# Patient Record
Sex: Male | Born: 1950 | Race: White | Hispanic: No | Marital: Married | Smoking: Former smoker
Health system: Southern US, Community
[De-identification: ages and names within clinical notes are randomized; demographics above are authoritative.]

## PROBLEM LIST (undated history)

## (undated) DIAGNOSIS — Z91030 Bee allergy status: Secondary | ICD-10-CM

## (undated) DIAGNOSIS — I4891 Unspecified atrial fibrillation: Secondary | ICD-10-CM

## (undated) DIAGNOSIS — I10 Essential (primary) hypertension: Secondary | ICD-10-CM

## (undated) DIAGNOSIS — E7800 Pure hypercholesterolemia, unspecified: Secondary | ICD-10-CM

## (undated) DIAGNOSIS — I639 Cerebral infarction, unspecified: Secondary | ICD-10-CM

## (undated) DIAGNOSIS — R634 Abnormal weight loss: Secondary | ICD-10-CM

## (undated) HISTORY — DX: Essential (primary) hypertension: I10

## (undated) HISTORY — DX: Unspecified atrial fibrillation: I48.91

## (undated) HISTORY — PX: CORONARY STENT PLACEMENT: SHX1402

## (undated) HISTORY — DX: Cerebral infarction, unspecified: I63.9

## (undated) HISTORY — DX: Bee allergy status: Z91.030

## (undated) HISTORY — PX: CORONARY ARTERY BYPASS GRAFT: SHX141

## (undated) HISTORY — PX: ARTERIAL BYPASS SURGRY: SHX557

## (undated) HISTORY — PX: CARDIAC SURGERY: SHX584

## (undated) HISTORY — DX: Abnormal weight loss: R63.4

## (undated) HISTORY — DX: Pure hypercholesterolemia, unspecified: E78.00

---

## 2004-03-19 ENCOUNTER — Inpatient Hospital Stay (HOSPITAL_BASED_OUTPATIENT_CLINIC_OR_DEPARTMENT_OTHER): Admission: RE | Admit: 2004-03-19 | Discharge: 2004-03-19 | Payer: Self-pay | Admitting: Cardiovascular Disease

## 2004-03-22 ENCOUNTER — Ambulatory Visit (HOSPITAL_COMMUNITY): Admission: RE | Admit: 2004-03-22 | Discharge: 2004-03-23 | Payer: Self-pay | Admitting: Cardiology

## 2004-12-20 ENCOUNTER — Ambulatory Visit: Payer: Self-pay | Admitting: Family Medicine

## 2005-03-19 ENCOUNTER — Ambulatory Visit: Payer: Self-pay | Admitting: Family Medicine

## 2005-06-25 ENCOUNTER — Ambulatory Visit: Payer: Self-pay | Admitting: Family Medicine

## 2005-11-19 ENCOUNTER — Ambulatory Visit: Payer: Self-pay | Admitting: Family Medicine

## 2009-11-02 ENCOUNTER — Telehealth (INDEPENDENT_AMBULATORY_CARE_PROVIDER_SITE_OTHER): Payer: Self-pay | Admitting: *Deleted

## 2012-01-29 ENCOUNTER — Encounter: Payer: Self-pay | Admitting: Emergency Medicine

## 2012-01-29 ENCOUNTER — Emergency Department (INDEPENDENT_AMBULATORY_CARE_PROVIDER_SITE_OTHER)
Admission: EM | Admit: 2012-01-29 | Discharge: 2012-01-29 | Disposition: A | Discharge status: Home / Self Care | Payer: BC Managed Care – PPO | Source: Home / Self Care | Attending: Family Medicine | Admitting: Family Medicine

## 2012-01-29 DIAGNOSIS — I10 Essential (primary) hypertension: Secondary | ICD-10-CM

## 2012-01-29 DIAGNOSIS — E1165 Type 2 diabetes mellitus with hyperglycemia: Secondary | ICD-10-CM

## 2012-01-29 LAB — POCT URINALYSIS DIP (MANUAL ENTRY)
Blood, UA: NEGATIVE
Glucose, UA: 100
Leukocytes, UA: NEGATIVE
Nitrite, UA: NEGATIVE
Urobilinogen, UA: 0.2 (ref 0–1)
pH, UA: 5.5 (ref 5–8)

## 2012-01-29 LAB — HEMOGLOBIN POC (KUC)

## 2012-01-29 NOTE — ED Notes (Signed)
Blood sugar problem

## 2012-01-29 NOTE — Discharge Instructions (Signed)
Continue present medications.  Check fasting blood sugar daily and record; take record to family doctor.

## 2012-01-29 NOTE — ED Provider Notes (Signed)
History     CSN: [id number]  Arrival date & time 01/29/12  4098   First MD Initiated Contact with Patient 01/29/12 [phone]      Chief Complaint  Patient presents with  . Blood Sugar Problem     HPI Comments: Patient reports that he has had no adverse effects from Glucophage.  He states that his FBS at home has decreased and ranges from about 130 to 160.  No hypoglycemic episodes.  The history is provided by the patient.    Past Medical History  Diagnosis Date  . Diabetes mellitus     Past Surgical History  Procedure Date  . Cardiac surgery     No family history on file.  History  Substance Use Topics  . Smoking status: Not on file  . Smokeless tobacco: Not on file  . Alcohol Use: Yes      Review of Systems  Allergies  Review of patient's allergies indicates no known allergies.  Home Medications   Current Outpatient Rx  Name Route Sig Dispense Refill  . CLOPIDOGREL BISULFATE 300 MG PO TABS Oral Take 300 mg by mouth once.    . METFORMIN HCL 500 MG PO TABS Oral Take 500 mg by mouth 2 (two) times daily with a meal.    . METOPROLOL SUCCINATE ER 100 MG PO TB24 Oral Take 100 mg by mouth daily. Take with or immediately following a meal.    . OLMESARTAN-AMLODIPINE-HCTZ 40-10-12.5 MG PO TABS Oral Take by mouth.    . ROSUVASTATIN CALCIUM 20 MG PO TABS Oral Take 20 mg by mouth daily.      BP 122/80  Pulse 75  Temp(Src) 97.5 F (36.4 C) (Oral)  Resp 16  Ht 6\' 1"  (1.854 m)  Wt 270 lb (122.471 kg)  BMI 35.62 kg/m2  SpO2 99%  Physical Exam Not examined ED Course  Procedures none  Labs Reviewed  HEMOGLOBIN POC ([facility]) - Abnormal; Notable for the following:    Hemoglobin POC 221 (*)    All other components within normal limits  POCT URINALYSIS DIP (MANUAL ENTRY) gluc 100mg /dL, trace ket, SG > 4.782, prot> 300mg /dL      1. Diabetes type 2, uncontrolled   2. Hypertension, well controlled       MDM  Home FBS, and today's glucosuria improved on Glucophage  500mg  bid. Advised patient to continue present meds, monitor FBS and record, and follow-up with PCP in one week for further medication adjustments. 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 Completed DOT driver's exam       Donna Christen, MD 01/29/12 1042

## 2015-08-09 DIAGNOSIS — I2510 Atherosclerotic heart disease of native coronary artery without angina pectoris: Secondary | ICD-10-CM | POA: Insufficient documentation

## 2015-08-09 DIAGNOSIS — I480 Paroxysmal atrial fibrillation: Secondary | ICD-10-CM | POA: Insufficient documentation

## 2015-08-09 DIAGNOSIS — E785 Hyperlipidemia, unspecified: Secondary | ICD-10-CM

## 2015-08-09 DIAGNOSIS — I482 Chronic atrial fibrillation, unspecified: Secondary | ICD-10-CM | POA: Insufficient documentation

## 2015-08-09 HISTORY — DX: Hyperlipidemia, unspecified: E78.5

## 2015-08-09 HISTORY — DX: Atherosclerotic heart disease of native coronary artery without angina pectoris: I25.10

## 2015-08-21 DIAGNOSIS — G459 Transient cerebral ischemic attack, unspecified: Secondary | ICD-10-CM

## 2015-08-21 HISTORY — DX: Transient cerebral ischemic attack, unspecified: G45.9

## 2015-10-02 DIAGNOSIS — I10 Essential (primary) hypertension: Secondary | ICD-10-CM

## 2015-10-02 HISTORY — DX: Essential (primary) hypertension: I10

## 2016-01-17 DIAGNOSIS — I480 Paroxysmal atrial fibrillation: Secondary | ICD-10-CM | POA: Diagnosis not present

## 2016-01-17 DIAGNOSIS — G459 Transient cerebral ischemic attack, unspecified: Secondary | ICD-10-CM | POA: Diagnosis not present

## 2016-01-17 DIAGNOSIS — I2510 Atherosclerotic heart disease of native coronary artery without angina pectoris: Secondary | ICD-10-CM | POA: Diagnosis not present

## 2016-01-17 DIAGNOSIS — E119 Type 2 diabetes mellitus without complications: Secondary | ICD-10-CM | POA: Diagnosis not present

## 2016-01-17 DIAGNOSIS — E785 Hyperlipidemia, unspecified: Secondary | ICD-10-CM | POA: Diagnosis not present

## 2016-01-17 DIAGNOSIS — I10 Essential (primary) hypertension: Secondary | ICD-10-CM | POA: Diagnosis not present

## 2016-01-21 DIAGNOSIS — E1122 Type 2 diabetes mellitus with diabetic chronic kidney disease: Secondary | ICD-10-CM

## 2016-01-21 DIAGNOSIS — E1165 Type 2 diabetes mellitus with hyperglycemia: Secondary | ICD-10-CM | POA: Insufficient documentation

## 2016-01-21 DIAGNOSIS — E119 Type 2 diabetes mellitus without complications: Secondary | ICD-10-CM | POA: Insufficient documentation

## 2016-01-21 DIAGNOSIS — I2510 Atherosclerotic heart disease of native coronary artery without angina pectoris: Secondary | ICD-10-CM | POA: Insufficient documentation

## 2016-01-21 DIAGNOSIS — E782 Mixed hyperlipidemia: Secondary | ICD-10-CM | POA: Insufficient documentation

## 2016-01-21 DIAGNOSIS — N1832 Chronic kidney disease, stage 3b: Secondary | ICD-10-CM | POA: Insufficient documentation

## 2016-01-21 HISTORY — DX: Mixed hyperlipidemia: E78.2

## 2016-01-21 HISTORY — DX: Atherosclerotic heart disease of native coronary artery without angina pectoris: I25.10

## 2016-01-21 HISTORY — DX: Chronic kidney disease, stage 3b: N18.32

## 2016-01-21 HISTORY — DX: Type 2 diabetes mellitus with diabetic chronic kidney disease: E11.22

## 2016-01-22 DIAGNOSIS — I4821 Permanent atrial fibrillation: Secondary | ICD-10-CM | POA: Insufficient documentation

## 2016-01-22 DIAGNOSIS — E854 Organ-limited amyloidosis: Secondary | ICD-10-CM | POA: Insufficient documentation

## 2016-01-22 DIAGNOSIS — I680 Cerebral amyloid angiopathy: Secondary | ICD-10-CM | POA: Insufficient documentation

## 2016-01-22 DIAGNOSIS — I10 Essential (primary) hypertension: Secondary | ICD-10-CM | POA: Diagnosis not present

## 2016-01-22 DIAGNOSIS — E119 Type 2 diabetes mellitus without complications: Secondary | ICD-10-CM | POA: Diagnosis not present

## 2016-01-22 DIAGNOSIS — E7800 Pure hypercholesterolemia, unspecified: Secondary | ICD-10-CM | POA: Diagnosis not present

## 2016-01-22 DIAGNOSIS — Z23 Encounter for immunization: Secondary | ICD-10-CM | POA: Diagnosis not present

## 2016-01-22 HISTORY — DX: Cerebral amyloid angiopathy: E85.4

## 2016-01-22 HISTORY — DX: Cerebral amyloid angiopathy: I68.0

## 2016-01-22 HISTORY — DX: Permanent atrial fibrillation: I48.21

## 2016-02-25 ENCOUNTER — Encounter: Payer: Self-pay | Admitting: Allergy and Immunology

## 2016-02-25 ENCOUNTER — Ambulatory Visit (INDEPENDENT_AMBULATORY_CARE_PROVIDER_SITE_OTHER): Payer: PPO | Admitting: Allergy and Immunology

## 2016-02-25 VITALS — BP 140/86 | HR 72 | Temp 97.8°F | Resp 16 | Ht 73.03 in | Wt 246.0 lb

## 2016-02-25 DIAGNOSIS — Z91030 Bee allergy status: Secondary | ICD-10-CM

## 2016-02-25 DIAGNOSIS — L920 Granuloma annulare: Secondary | ICD-10-CM

## 2016-02-25 DIAGNOSIS — Z91038 Other insect allergy status: Secondary | ICD-10-CM | POA: Diagnosis not present

## 2016-02-25 MED ORDER — EPINEPHRINE 0.3 MG/0.3ML IJ SOAJ: INTRAMUSCULAR | Status: DC

## 2016-02-25 NOTE — Patient Instructions (Signed)
  1. Allergen avoidance measures  2. EpiPen, Benadryl, M.D./ER for severe allergic reaction  3. Venom clinic  4. Immunotherapy?

## 2016-02-25 NOTE — Progress Notes (Signed)
NEW PATIENT NOTE  Referring Provider: No ref. provider found Primary Provider: Teressa Lower, MD Date of office visit: 02/25/2016    Subjective:   Chief Complaint:  Robert Spears (DOB: Dec 22, 1950) is a 65 y.o. 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 In the past he would receive a systemic steroid dose which would end up making his reaction less red but never really make it completely resolved. He has tried multiple topical creams which have not helped.  Robert Spears also has a problem with insect stings. He's had 2 rather significant episodes in the past 40 years after being stung by a flying insect that as resulted in him developing vomiting and wooziness and dizziness and inability to get upright 4 hours. This apparently occurs within 15 minutes after being stung. He's also had other episodes where he just gets nauseated and vomiting within 15 minutes. He never gets any associated systemic or constitutional symptoms.   Past Medical History  Diagnosis Date  . Diabetes mellitus   . High blood pressure   . High cholesterol   . Allergy to bee sting     Past Surgical History  Procedure Laterality Date  . Cardiac surgery      Outpatient Prescriptions Prior to Visit  Medication Sig Dispense Refill  . metoprolol succinate (TOPROL-XL) 100 MG 24 hr tablet Take 100 mg by mouth daily. Take with or immediately following a meal.    . clopidogrel (PLAVIX) 300 MG TABS Take 300 mg by mouth once.    . metFORMIN (GLUCOPHAGE) 500 MG tablet Take 500 mg by mouth 2 (two) times daily with a meal.    . Olmesartan-Amlodipine-HCTZ (TRIBENZOR) 40-10-12.5  MG TABS Take by mouth.    . rosuvastatin (CRESTOR) 20 MG tablet Take 20 mg by mouth daily.     No facility-administered medications prior to visit.    No Known Allergies  Review of systems negative except as noted in HPI / PMHx or noted below:  Review of Systems  Constitutional: Negative.   HENT: Negative.   Eyes: Negative.   Respiratory: Negative.   Cardiovascular: Negative.   Gastrointestinal: Negative.   Genitourinary: Negative.   Musculoskeletal: Negative.   Skin: Negative.   Neurological: Negative.   Endo/Heme/Allergies: Negative.   Psychiatric/Behavioral: Negative.     Family History  Problem Relation Age of Onset  . Diabetes Mother   . High blood pressure Mother   . Cancer Sister   . Heart attack Brother   . Diabetes Brother   . High blood pressure Brother     Social History   Social History  . Marital Status: Married    Spouse Name: N/A  . Number of Children: N/A  . Years of Education: N/A   Occupational History  . Not on file.   Social History Main Topics  . Smoking status: Former Smoker    Quit date: 02/25/1996  . Smokeless tobacco: Former Systems developer  . Alcohol Use: No  . Drug Use: No  . Sexual Activity: Not on file   Other Topics Concern  . Not on file   Social History Narrative    Environmental and Social history  Lives in  a house with a dry environment, no animals located inside the household, no carpeting in the bedroom, no plastic on the bed or pillow, and no smokers located inside the household.   Objective:   Filed Vitals:   02/25/16 0945  BP: 140/86  Pulse: 72  Temp: 97.8 F (36.6 C)  Resp: 16   Height: 6' 1.03" (185.5 cm) Weight: 246 lb 0.5 oz (111.6 kg)  Physical Exam  Constitutional: He is well-developed, well-nourished, and in no distress.  HENT:  Head: Normocephalic.  Right Ear: Tympanic membrane, external ear and ear canal normal.  Left Ear: Tympanic membrane, external ear and ear canal normal.  Nose: Nose normal. No  mucosal edema or rhinorrhea.  Mouth/Throat: Uvula is midline, oropharynx is clear and moist and mucous membranes are normal. No oropharyngeal exudate.  Eyes: Conjunctivae are normal.  Neck: Trachea normal. No tracheal tenderness present. No tracheal deviation present. No thyromegaly present.  Cardiovascular: Normal rate, S1 normal, S2 normal and normal heart sounds.  An irregularly irregular rhythm present.  No murmur heard. Pulmonary/Chest: Breath sounds normal. No stridor. No respiratory distress. He has no wheezes. He has no rales.  Musculoskeletal: He exhibits no edema.  Lymphadenopathy:       Head (right side): No tonsillar adenopathy present.       Head (left side): No tonsillar adenopathy present.    He has no cervical adenopathy.    He has no axillary adenopathy.  Neurological: He is alert. Gait normal.  Skin: Rash (Palisading, erythematous, circumferential, partially blanching, dermatitis of inner forearms and inner thighs bilaterally) noted. He is not diaphoretic. No erythema. Nails show no clubbing.  Psychiatric: Mood and affect normal.     Diagnostics: None   Assessment and Plan:    1. Granuloma annulare   2. Hymenoptera allergy     1. Allergen avoidance measures  2. EpiPen, Benadryl, M.D./ER for severe allergic reaction  3. Venom clinic  4. Immunotherapy?  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 At this point I would just recommend that he not have any specific therapy directed against this condition. His insect reactivity is a different story and I think it would be worthwhile to have him evaluated for possible Hymenoptera allergy which we will do during her next venom clinic.   Allena Katz, MD [place]

## 2016-02-29 DIAGNOSIS — H43813 Vitreous degeneration, bilateral: Secondary | ICD-10-CM | POA: Diagnosis not present

## 2016-02-29 DIAGNOSIS — H35373 Puckering of macula, bilateral: Secondary | ICD-10-CM | POA: Diagnosis not present

## 2016-02-29 DIAGNOSIS — E119 Type 2 diabetes mellitus without complications: Secondary | ICD-10-CM | POA: Diagnosis not present

## 2016-02-29 DIAGNOSIS — H524 Presbyopia: Secondary | ICD-10-CM | POA: Diagnosis not present

## 2016-02-29 DIAGNOSIS — H43393 Other vitreous opacities, bilateral: Secondary | ICD-10-CM | POA: Diagnosis not present

## 2016-02-29 DIAGNOSIS — I10 Essential (primary) hypertension: Secondary | ICD-10-CM | POA: Diagnosis not present

## 2016-02-29 DIAGNOSIS — H3563 Retinal hemorrhage, bilateral: Secondary | ICD-10-CM | POA: Diagnosis not present

## 2016-02-29 DIAGNOSIS — E113293 Type 2 diabetes mellitus with mild nonproliferative diabetic retinopathy without macular edema, bilateral: Secondary | ICD-10-CM | POA: Diagnosis not present

## 2016-02-29 DIAGNOSIS — H5203 Hypermetropia, bilateral: Secondary | ICD-10-CM | POA: Diagnosis not present

## 2016-02-29 DIAGNOSIS — H52223 Regular astigmatism, bilateral: Secondary | ICD-10-CM | POA: Diagnosis not present

## 2016-02-29 DIAGNOSIS — H25813 Combined forms of age-related cataract, bilateral: Secondary | ICD-10-CM | POA: Diagnosis not present

## 2016-02-29 DIAGNOSIS — Z7984 Long term (current) use of oral hypoglycemic drugs: Secondary | ICD-10-CM | POA: Diagnosis not present

## 2016-08-01 DIAGNOSIS — Z87891 Personal history of nicotine dependence: Secondary | ICD-10-CM | POA: Diagnosis not present

## 2016-08-01 DIAGNOSIS — I482 Chronic atrial fibrillation: Secondary | ICD-10-CM | POA: Diagnosis not present

## 2016-08-01 DIAGNOSIS — M545 Low back pain, unspecified: Secondary | ICD-10-CM | POA: Insufficient documentation

## 2016-08-01 DIAGNOSIS — I10 Essential (primary) hypertension: Secondary | ICD-10-CM | POA: Diagnosis not present

## 2016-08-01 DIAGNOSIS — E119 Type 2 diabetes mellitus without complications: Secondary | ICD-10-CM | POA: Diagnosis not present

## 2016-08-01 DIAGNOSIS — Z7901 Long term (current) use of anticoagulants: Secondary | ICD-10-CM | POA: Diagnosis not present

## 2016-08-01 DIAGNOSIS — I25119 Atherosclerotic heart disease of native coronary artery with unspecified angina pectoris: Secondary | ICD-10-CM | POA: Diagnosis not present

## 2016-08-01 DIAGNOSIS — G8929 Other chronic pain: Secondary | ICD-10-CM

## 2016-08-01 DIAGNOSIS — E7800 Pure hypercholesterolemia, unspecified: Secondary | ICD-10-CM | POA: Diagnosis not present

## 2016-08-01 HISTORY — DX: Other chronic pain: G89.29

## 2016-08-06 DIAGNOSIS — M4806 Spinal stenosis, lumbar region: Secondary | ICD-10-CM | POA: Diagnosis not present

## 2016-08-14 DIAGNOSIS — E785 Hyperlipidemia, unspecified: Secondary | ICD-10-CM | POA: Diagnosis not present

## 2016-08-14 DIAGNOSIS — I480 Paroxysmal atrial fibrillation: Secondary | ICD-10-CM | POA: Diagnosis not present

## 2016-08-14 DIAGNOSIS — I2510 Atherosclerotic heart disease of native coronary artery without angina pectoris: Secondary | ICD-10-CM | POA: Diagnosis not present

## 2016-08-14 DIAGNOSIS — E119 Type 2 diabetes mellitus without complications: Secondary | ICD-10-CM | POA: Diagnosis not present

## 2016-08-14 DIAGNOSIS — I10 Essential (primary) hypertension: Secondary | ICD-10-CM | POA: Diagnosis not present

## 2016-08-19 DIAGNOSIS — M5416 Radiculopathy, lumbar region: Secondary | ICD-10-CM | POA: Diagnosis not present

## 2016-08-19 DIAGNOSIS — M545 Low back pain: Secondary | ICD-10-CM | POA: Diagnosis not present

## 2016-08-19 DIAGNOSIS — M4806 Spinal stenosis, lumbar region: Secondary | ICD-10-CM | POA: Diagnosis not present

## 2016-08-20 DIAGNOSIS — I2510 Atherosclerotic heart disease of native coronary artery without angina pectoris: Secondary | ICD-10-CM | POA: Diagnosis not present

## 2016-08-20 DIAGNOSIS — I480 Paroxysmal atrial fibrillation: Secondary | ICD-10-CM | POA: Diagnosis not present

## 2016-08-20 DIAGNOSIS — E119 Type 2 diabetes mellitus without complications: Secondary | ICD-10-CM | POA: Diagnosis not present

## 2016-08-20 DIAGNOSIS — I10 Essential (primary) hypertension: Secondary | ICD-10-CM | POA: Diagnosis not present

## 2016-08-20 DIAGNOSIS — E785 Hyperlipidemia, unspecified: Secondary | ICD-10-CM | POA: Diagnosis not present

## 2016-08-22 DIAGNOSIS — M4806 Spinal stenosis, lumbar region: Secondary | ICD-10-CM | POA: Diagnosis not present

## 2016-08-26 DIAGNOSIS — Z01810 Encounter for preprocedural cardiovascular examination: Secondary | ICD-10-CM | POA: Diagnosis not present

## 2016-08-27 DIAGNOSIS — M79609 Pain in unspecified limb: Secondary | ICD-10-CM | POA: Diagnosis not present

## 2016-08-27 DIAGNOSIS — R52 Pain, unspecified: Secondary | ICD-10-CM | POA: Diagnosis not present

## 2016-08-27 DIAGNOSIS — E559 Vitamin D deficiency, unspecified: Secondary | ICD-10-CM | POA: Diagnosis not present

## 2016-08-27 DIAGNOSIS — Z79899 Other long term (current) drug therapy: Secondary | ICD-10-CM | POA: Diagnosis not present

## 2016-08-27 DIAGNOSIS — Z01818 Encounter for other preprocedural examination: Secondary | ICD-10-CM | POA: Diagnosis not present

## 2016-08-28 DIAGNOSIS — I480 Paroxysmal atrial fibrillation: Secondary | ICD-10-CM | POA: Diagnosis not present

## 2016-08-28 DIAGNOSIS — G459 Transient cerebral ischemic attack, unspecified: Secondary | ICD-10-CM | POA: Diagnosis not present

## 2016-08-28 DIAGNOSIS — I2510 Atherosclerotic heart disease of native coronary artery without angina pectoris: Secondary | ICD-10-CM | POA: Diagnosis not present

## 2016-08-28 DIAGNOSIS — I10 Essential (primary) hypertension: Secondary | ICD-10-CM | POA: Diagnosis not present

## 2016-09-03 DIAGNOSIS — G8929 Other chronic pain: Secondary | ICD-10-CM | POA: Diagnosis not present

## 2016-09-03 DIAGNOSIS — M545 Low back pain: Secondary | ICD-10-CM | POA: Diagnosis not present

## 2016-09-08 DIAGNOSIS — Z951 Presence of aortocoronary bypass graft: Secondary | ICD-10-CM | POA: Diagnosis not present

## 2016-09-08 DIAGNOSIS — Z23 Encounter for immunization: Secondary | ICD-10-CM | POA: Diagnosis not present

## 2016-09-08 DIAGNOSIS — E7800 Pure hypercholesterolemia, unspecified: Secondary | ICD-10-CM | POA: Diagnosis not present

## 2016-09-08 DIAGNOSIS — Z7982 Long term (current) use of aspirin: Secondary | ICD-10-CM | POA: Diagnosis not present

## 2016-09-08 DIAGNOSIS — I252 Old myocardial infarction: Secondary | ICD-10-CM | POA: Diagnosis not present

## 2016-09-08 DIAGNOSIS — Z8673 Personal history of transient ischemic attack (TIA), and cerebral infarction without residual deficits: Secondary | ICD-10-CM | POA: Diagnosis not present

## 2016-09-08 DIAGNOSIS — M4806 Spinal stenosis, lumbar region: Secondary | ICD-10-CM | POA: Diagnosis not present

## 2016-09-08 DIAGNOSIS — M79604 Pain in right leg: Secondary | ICD-10-CM | POA: Diagnosis not present

## 2016-09-08 DIAGNOSIS — I2510 Atherosclerotic heart disease of native coronary artery without angina pectoris: Secondary | ICD-10-CM | POA: Diagnosis not present

## 2016-09-08 DIAGNOSIS — I10 Essential (primary) hypertension: Secondary | ICD-10-CM | POA: Diagnosis not present

## 2016-09-08 DIAGNOSIS — Z7984 Long term (current) use of oral hypoglycemic drugs: Secondary | ICD-10-CM | POA: Diagnosis not present

## 2016-09-08 DIAGNOSIS — M5416 Radiculopathy, lumbar region: Secondary | ICD-10-CM | POA: Diagnosis not present

## 2016-09-08 DIAGNOSIS — E119 Type 2 diabetes mellitus without complications: Secondary | ICD-10-CM | POA: Diagnosis not present

## 2016-09-08 DIAGNOSIS — Z79899 Other long term (current) drug therapy: Secondary | ICD-10-CM | POA: Diagnosis not present

## 2016-09-08 DIAGNOSIS — M5136 Other intervertebral disc degeneration, lumbar region: Secondary | ICD-10-CM | POA: Diagnosis not present

## 2016-09-08 DIAGNOSIS — I519 Heart disease, unspecified: Secondary | ICD-10-CM | POA: Diagnosis not present

## 2016-09-08 DIAGNOSIS — I4891 Unspecified atrial fibrillation: Secondary | ICD-10-CM | POA: Diagnosis not present

## 2016-09-08 DIAGNOSIS — M545 Low back pain: Secondary | ICD-10-CM | POA: Diagnosis not present

## 2016-09-08 DIAGNOSIS — M5116 Intervertebral disc disorders with radiculopathy, lumbar region: Secondary | ICD-10-CM | POA: Diagnosis not present

## 2016-09-08 DIAGNOSIS — Z6832 Body mass index (BMI) 32.0-32.9, adult: Secondary | ICD-10-CM | POA: Diagnosis not present

## 2016-09-08 DIAGNOSIS — E669 Obesity, unspecified: Secondary | ICD-10-CM | POA: Diagnosis not present

## 2017-02-18 DIAGNOSIS — M48062 Spinal stenosis, lumbar region with neurogenic claudication: Secondary | ICD-10-CM | POA: Diagnosis not present

## 2017-02-19 DIAGNOSIS — E782 Mixed hyperlipidemia: Secondary | ICD-10-CM | POA: Diagnosis not present

## 2017-02-19 DIAGNOSIS — I482 Chronic atrial fibrillation: Secondary | ICD-10-CM | POA: Diagnosis not present

## 2017-02-19 DIAGNOSIS — I10 Essential (primary) hypertension: Secondary | ICD-10-CM | POA: Diagnosis not present

## 2017-02-19 DIAGNOSIS — E119 Type 2 diabetes mellitus without complications: Secondary | ICD-10-CM | POA: Diagnosis not present

## 2017-02-19 DIAGNOSIS — E7800 Pure hypercholesterolemia, unspecified: Secondary | ICD-10-CM | POA: Diagnosis not present

## 2017-02-19 DIAGNOSIS — I25119 Atherosclerotic heart disease of native coronary artery with unspecified angina pectoris: Secondary | ICD-10-CM | POA: Diagnosis not present

## 2017-04-14 DIAGNOSIS — E119 Type 2 diabetes mellitus without complications: Secondary | ICD-10-CM | POA: Diagnosis not present

## 2017-04-14 DIAGNOSIS — I10 Essential (primary) hypertension: Secondary | ICD-10-CM | POA: Diagnosis not present

## 2017-04-14 DIAGNOSIS — E785 Hyperlipidemia, unspecified: Secondary | ICD-10-CM | POA: Diagnosis not present

## 2017-04-14 DIAGNOSIS — I2510 Atherosclerotic heart disease of native coronary artery without angina pectoris: Secondary | ICD-10-CM | POA: Diagnosis not present

## 2017-04-14 DIAGNOSIS — I480 Paroxysmal atrial fibrillation: Secondary | ICD-10-CM | POA: Diagnosis not present

## 2017-08-24 DIAGNOSIS — I25119 Atherosclerotic heart disease of native coronary artery with unspecified angina pectoris: Secondary | ICD-10-CM | POA: Diagnosis not present

## 2017-08-24 DIAGNOSIS — E119 Type 2 diabetes mellitus without complications: Secondary | ICD-10-CM | POA: Diagnosis not present

## 2017-08-24 DIAGNOSIS — E782 Mixed hyperlipidemia: Secondary | ICD-10-CM | POA: Diagnosis not present

## 2017-08-24 DIAGNOSIS — M51369 Other intervertebral disc degeneration, lumbar region without mention of lumbar back pain or lower extremity pain: Secondary | ICD-10-CM

## 2017-08-24 DIAGNOSIS — M5136 Other intervertebral disc degeneration, lumbar region: Secondary | ICD-10-CM

## 2017-08-24 DIAGNOSIS — I10 Essential (primary) hypertension: Secondary | ICD-10-CM | POA: Diagnosis not present

## 2017-08-24 HISTORY — DX: Other intervertebral disc degeneration, lumbar region: M51.36

## 2017-08-24 HISTORY — DX: Other intervertebral disc degeneration, lumbar region without mention of lumbar back pain or lower extremity pain: M51.369

## 2017-08-26 DIAGNOSIS — M48062 Spinal stenosis, lumbar region with neurogenic claudication: Secondary | ICD-10-CM | POA: Diagnosis not present

## 2017-08-31 ENCOUNTER — Ambulatory Visit (INDEPENDENT_AMBULATORY_CARE_PROVIDER_SITE_OTHER): Payer: PPO | Admitting: Cardiology

## 2017-08-31 ENCOUNTER — Encounter: Payer: Self-pay | Admitting: Cardiology

## 2017-08-31 VITALS — BP 110/70 | HR 76 | Resp 10 | Ht 73.0 in | Wt 264.0 lb

## 2017-08-31 DIAGNOSIS — I480 Paroxysmal atrial fibrillation: Secondary | ICD-10-CM | POA: Diagnosis not present

## 2017-08-31 DIAGNOSIS — I482 Chronic atrial fibrillation, unspecified: Secondary | ICD-10-CM

## 2017-08-31 DIAGNOSIS — E119 Type 2 diabetes mellitus without complications: Secondary | ICD-10-CM | POA: Diagnosis not present

## 2017-08-31 DIAGNOSIS — I10 Essential (primary) hypertension: Secondary | ICD-10-CM

## 2017-08-31 DIAGNOSIS — I2510 Atherosclerotic heart disease of native coronary artery without angina pectoris: Secondary | ICD-10-CM | POA: Diagnosis not present

## 2017-08-31 DIAGNOSIS — E785 Hyperlipidemia, unspecified: Secondary | ICD-10-CM

## 2017-08-31 MED ORDER — LOSARTAN POTASSIUM 100 MG PO TABS: 100.0000 mg | ORAL_TABLET | Freq: Every day | ORAL | 11 refills | Status: DC

## 2017-08-31 NOTE — Patient Instructions (Signed)
Medication Instructions:  Your physician has recommended you make the following change in your medication:  1.) STOP Valsartan. 2.) START Losartan 100 mg daily.   Labwork: None   Testing/Procedures: EKG in office today.   Your physician has requested that you have an echocardiogram. Echocardiography is a painless test that uses sound waves to create images of your heart. It provides your doctor with information about the size and shape of your heart and how well your heart's chambers and valves are working. This procedure takes approximately one hour. There are no restrictions for this procedure.  Follow-Up: Your physician wants you to follow-up in: 6 months. You will receive a reminder letter in the mail two months in advance. If you don't receive a letter, please call our office to schedule the follow-up appointment.  Any Other Special Instructions Will Be Listed Below (If Applicable).  Please note that any paperwork needing to be filled out by the provider will need to be addressed at the front desk prior to seeing the provider. Please note that any paperwork FMLA, Disability or other documents regarding health condition is subject to a $25.00 charge that must be received prior to completion of paperwork in the form of a money order or check.    If you need a refill on your cardiac medications before your next appointment, please call your pharmacy.

## 2017-08-31 NOTE — Progress Notes (Signed)
Cardiology Office Note:    Date:  08/31/2017   ID:  Robert Spears, DOB [birth year], MRN [id number]  PCP:  Algis Greenhouse, MD  Cardiologist:  Jenne Campus, MD    Referring MD: Algis Greenhouse, MD   Chief Complaint  Patient presents with  . Discuss Medication Changes  "I'm doing well but I'm concerned about medications"  History of Present Illness:    Robert Spears is a 66 y.o. male  With multiple medical problems. Overall he seems to be doing well. Denies having any chest pain tightness squeezing pressure burning chest, no palpitations. He still very active and does a lot and had no difficulty doing this.  Past Medical History:  Diagnosis Date  . A-fib ([place])   . Allergy to bee sting   . Diabetes mellitus   . High blood pressure   . High cholesterol   . Stroke ([place])   . Weight loss     Past Surgical History:  Procedure Laterality Date  . ARTERIAL BYPASS SURGRY    . CARDIAC SURGERY    . CORONARY ARTERY BYPASS GRAFT    . CORONARY STENT PLACEMENT      Current Medications: Current Meds  Medication Sig  . amLODipine (NORVASC) 5 MG tablet Take 5 mg by mouth daily.   Marland Kitchen aspirin EC 81 MG tablet Take 81 mg by mouth daily.   Marland Kitchen atorvastatin (LIPITOR) 80 MG tablet Take 40 mg by mouth daily.   Marland Kitchen EPINEPHrine (EPIPEN 2-PAK) 0.3 mg/0.3 mL IJ SOAJ injection Use as directed for life-threatening allergic reaction.  Marland Kitchen glimepiride (AMARYL) 2 MG tablet Take 2 mg by mouth daily.   . metFORMIN (GLUCOPHAGE) 1000 MG tablet Take 1,000 mg by mouth 2 (two) times daily.  . metoprolol succinate (TOPROL-XL) 100 MG 24 hr tablet Take 100 mg by mouth daily. Take with or immediately following a meal.  . Multiple Vitamin (MULTI-VITAMINS) TABS Take 1 tablet by mouth daily.   . nitroGLYCERIN (NITROSTAT) 0.4 MG SL tablet Place 0.4 mg under the tongue every 5 (five) minutes as needed.   . rivaroxaban (XARELTO) 20 MG TABS tablet Take 20 mg by mouth daily.   . saxagliptin HCl (ONGLYZA) 5 MG TABS  tablet Take 5 mg by mouth daily.   . [DISCONTINUED] valsartan (DIOVAN) 320 MG tablet Take 320 mg by mouth daily.      Allergies:   Bee venom   Social History   Social History  . Marital status: Married    Spouse name: N/A  . Number of children: N/A  . Years of education: N/A   Social History Main Topics  . Smoking status: Former Smoker    Quit date: 02/25/1996  . Smokeless tobacco: Former Systems developer  . Alcohol use No  . Drug use: No  . Sexual activity: Not Asked   Other Topics Concern  . None   Social History Narrative  . None     Family History: The patient's family history includes Cancer in his sister; Diabetes in his brother and mother; Heart attack in his brother; Heart disease in his brother and mother; High blood pressure in his brother and mother. ROS:   Please see the history of present illness.    All 14 point review of systems negative except as described per history of present illness  EKGs/Labs/Other Studies Reviewed:    EKG done today show atrial fibrillation with controlled ventricular rate, poor R wave progression anterior precordium, nonspecific ST-T segment changes. Will get echocardiogram  to check left ventricular ejection fraction  Recent Labs: No results found for requested labs within last 8760 hours.  Recent Lipid Panel No results found for: CHOL, TRIG, HDL, CHOLHDL, VLDL, LDLCALC, LDLDIRECT  Physical Exam:    VS:  BP 110/70   Pulse 76   Resp 10   Ht 6\' 1"  (1.854 m)   Wt 264 lb (119.7 kg)   BMI 34.83 kg/m     Wt Readings from Last 3 Encounters:  08/31/17 264 lb (119.7 kg)  02/25/16 246 lb 0.5 oz (111.6 kg)  01/29/12 270 lb (122.5 kg)     GEN:  Well nourished, well developed in no acute distress HEENT: Normal NECK: No JVD; No carotid bruits LYMPHATICS: No lymphadenopathy CARDIAC: irregularly irregular, no murmurs, no rubs, no gallops RESPIRATORY:  Clear to auscultation without rales, wheezing or rhonchi  ABDOMEN: Soft, non-tender,  non-distended MUSCULOSKELETAL:  No edema; No deformity  SKIN: Warm and dry LOWER EXTREMITIES: no swelling NEUROLOGIC:  Alert and oriented x 3 PSYCHIATRIC:  Normal affect   ASSESSMENT:    1. Coronary artery disease involving native coronary artery of native heart without angina pectoris   2. PAF (paroxysmal atrial fibrillation) ([place])   3. Chronic atrial fibrillation ([place])   4. Essential hypertension   5. Dyslipidemia   6. Type 2 diabetes mellitus without complication, without long-term current use of insulin (HCC)    PLAN:    In order of problems listed above:  1. Coronary artery disease: Stable, asymptomatic. Doing well. 2. Chronic atrial fibrillation: Rate controlled, anticoagulated because of high CHADS2Vas score. We'll continue. I will ask him to get echocardiogram to assess left atrial size. 3. Essential hypertension: Blood pressure well-controlled continue present management however we had a long discussion about valsartan. He's concerned about recent new cysts regarding that medication. Apparently he does have some contamination that can lead to cancer he does not want to take this medication more, we'll switch him to losartan. 4. Dyslipidemia: We'll call primary care physician to get fasting lipid profile. 5. Diabetes mellitus: He admits that his A1c slightly elevated 7.2. He understand what the problem is he understand that he need to eat better and exercise more.   Medication Adjustments/Labs and Tests Ordered: Current medicines are reviewed at length with the patient today.  Concerns regarding medicines are outlined above.  Orders Placed This Encounter  Procedures  . EKG 12-Lead  . ECHOCARDIOGRAM COMPLETE   Medication changes:  Meds ordered this encounter  Medications  . losartan (COZAAR) 100 MG tablet    Sig: Take 1 tablet (100 mg total) by mouth daily.    Dispense:  30 tablet    Refill:  11    Signed, Park Liter, MD, [facility] 08/31/2017 11:37 AM    [place]

## 2017-09-04 ENCOUNTER — Ambulatory Visit (HOSPITAL_BASED_OUTPATIENT_CLINIC_OR_DEPARTMENT_OTHER)
Admission: RE | Admit: 2017-09-04 | Discharge: 2017-09-04 | Disposition: A | Discharge status: Home / Self Care | Payer: PPO | Source: Ambulatory Visit | Attending: Cardiology | Admitting: Cardiology

## 2017-09-04 ENCOUNTER — Ambulatory Visit (HOSPITAL_BASED_OUTPATIENT_CLINIC_OR_DEPARTMENT_OTHER): Payer: PPO

## 2017-09-04 DIAGNOSIS — E119 Type 2 diabetes mellitus without complications: Secondary | ICD-10-CM | POA: Insufficient documentation

## 2017-09-04 DIAGNOSIS — I119 Hypertensive heart disease without heart failure: Secondary | ICD-10-CM | POA: Insufficient documentation

## 2017-09-04 DIAGNOSIS — I2510 Atherosclerotic heart disease of native coronary artery without angina pectoris: Secondary | ICD-10-CM | POA: Insufficient documentation

## 2017-09-04 DIAGNOSIS — I340 Nonrheumatic mitral (valve) insufficiency: Secondary | ICD-10-CM | POA: Insufficient documentation

## 2017-09-04 DIAGNOSIS — I480 Paroxysmal atrial fibrillation: Secondary | ICD-10-CM | POA: Diagnosis not present

## 2017-09-04 DIAGNOSIS — Z8673 Personal history of transient ischemic attack (TIA), and cerebral infarction without residual deficits: Secondary | ICD-10-CM | POA: Insufficient documentation

## 2017-09-04 DIAGNOSIS — E785 Hyperlipidemia, unspecified: Secondary | ICD-10-CM | POA: Insufficient documentation

## 2017-09-04 NOTE — Progress Notes (Signed)
  Echocardiogram 2D Echocardiogram has been performed.  Darlina Sicilian M 09/04/2017, 12:04 PM

## 2017-09-28 ENCOUNTER — Telehealth: Payer: Self-pay | Admitting: Cardiology

## 2017-09-28 DIAGNOSIS — Z23 Encounter for immunization: Secondary | ICD-10-CM | POA: Diagnosis not present

## 2017-09-28 MED ORDER — METOPROLOL SUCCINATE ER 100 MG PO TB24: 100.0000 mg | ORAL_TABLET | Freq: Every day | ORAL | 2 refills | Status: DC

## 2017-09-28 NOTE — Telephone Encounter (Signed)
°*  STAT* If patient is at the pharmacy, call can be transferred to refill team.   1. Which medications need to be refilled? (please list name of each medication and dose if known) Metoprolol 100mg  takes one daily  2. Which pharmacy/location (including street and city if local pharmacy) is medication to be sent to? [facility] [place]  3. Do they need a 30 day or 90 day supply? [place]

## 2017-09-28 NOTE — Telephone Encounter (Signed)
Refills sent in

## 2017-11-11 DIAGNOSIS — K5900 Constipation, unspecified: Secondary | ICD-10-CM | POA: Diagnosis not present

## 2017-11-11 DIAGNOSIS — K5730 Diverticulosis of large intestine without perforation or abscess without bleeding: Secondary | ICD-10-CM | POA: Diagnosis not present

## 2017-11-11 DIAGNOSIS — K219 Gastro-esophageal reflux disease without esophagitis: Secondary | ICD-10-CM | POA: Diagnosis not present

## 2017-11-16 ENCOUNTER — Telehealth: Payer: Self-pay | Admitting: Cardiology

## 2017-11-16 NOTE — Telephone Encounter (Signed)
Tina from Dr. Crisoforo [place] office called and they received the fax about patient not being able to stop blood thinner for colonoscopy... However Dr. Lyda Jester was wondering if he would ever be able to stop the meds for a procedure and if so when does he think it may be? May hold off on procedur til he is able to if not too long.Marland Kitchen Please call Otila Kluver

## 2017-11-17 NOTE — Telephone Encounter (Signed)
Spoke with Otila Kluver, advised her that Dr. Agustin Cree that he did not want stop the Xarelto due to patient being high risk, but if Dr. Elita Boone felt stopping was necessary he could stop 24 hours prior. Also Advised that Dr. Agustin Cree said another option would be to do the colonoscopy without stopping and if the patient was found to have polyp than to schedule another colonoscopy to remove polyps and staop Xarelto at that point.

## 2017-11-17 NOTE — Telephone Encounter (Signed)
Estill Bamberg has addressed this.

## 2017-12-03 ENCOUNTER — Other Ambulatory Visit: Payer: Self-pay

## 2017-12-03 MED ORDER — LOSARTAN POTASSIUM 100 MG PO TABS: 100.0000 mg | ORAL_TABLET | Freq: Every day | ORAL | 3 refills | Status: DC

## 2017-12-24 ENCOUNTER — Telehealth: Payer: Self-pay | Admitting: Cardiology

## 2017-12-24 MED ORDER — RIVAROXABAN 20 MG PO TABS: 20.0000 mg | ORAL_TABLET | Freq: Every day | ORAL | 6 refills | Status: DC

## 2017-12-24 NOTE — Telephone Encounter (Signed)
Sent!

## 2017-12-24 NOTE — Telephone Encounter (Signed)
Call xarelto to [facility] in [place]

## 2018-01-26 DIAGNOSIS — M48062 Spinal stenosis, lumbar region with neurogenic claudication: Secondary | ICD-10-CM | POA: Diagnosis not present

## 2018-01-26 DIAGNOSIS — Z9889 Other specified postprocedural states: Secondary | ICD-10-CM | POA: Diagnosis not present

## 2018-03-18 DIAGNOSIS — I25119 Atherosclerotic heart disease of native coronary artery with unspecified angina pectoris: Secondary | ICD-10-CM | POA: Diagnosis not present

## 2018-03-18 DIAGNOSIS — I10 Essential (primary) hypertension: Secondary | ICD-10-CM | POA: Diagnosis not present

## 2018-03-18 DIAGNOSIS — E782 Mixed hyperlipidemia: Secondary | ICD-10-CM | POA: Diagnosis not present

## 2018-03-18 DIAGNOSIS — E119 Type 2 diabetes mellitus without complications: Secondary | ICD-10-CM | POA: Diagnosis not present

## 2018-03-19 ENCOUNTER — Encounter: Payer: Self-pay | Admitting: Cardiology

## 2018-03-19 ENCOUNTER — Ambulatory Visit (INDEPENDENT_AMBULATORY_CARE_PROVIDER_SITE_OTHER): Payer: PPO | Admitting: Cardiology

## 2018-03-19 VITALS — BP 120/70 | HR 75 | Ht 73.0 in | Wt 268.1 lb

## 2018-03-19 DIAGNOSIS — I482 Chronic atrial fibrillation, unspecified: Secondary | ICD-10-CM

## 2018-03-19 DIAGNOSIS — E119 Type 2 diabetes mellitus without complications: Secondary | ICD-10-CM | POA: Diagnosis not present

## 2018-03-19 DIAGNOSIS — E785 Hyperlipidemia, unspecified: Secondary | ICD-10-CM

## 2018-03-19 DIAGNOSIS — I10 Essential (primary) hypertension: Secondary | ICD-10-CM | POA: Diagnosis not present

## 2018-03-19 DIAGNOSIS — I2510 Atherosclerotic heart disease of native coronary artery without angina pectoris: Secondary | ICD-10-CM

## 2018-03-19 NOTE — Progress Notes (Signed)
Cardiology Office Note:    Date:  03/19/2018   ID:  Jonmarc Bodkin, DOB [birth year], MRN [id number]  PCP:  Algis Greenhouse, MD  Cardiologist:  Jenne Campus, MD    Referring MD: Algis Greenhouse, MD   Chief Complaint  Patient presents with  . Follow-up  Doing well  History of Present Illness:    Robert Spears is a 67 y.o. male with chronic atrial fibrillation, coronary artery disease overall doing well asymptomatic and very active works in the garden have no difficulty doing it he tells me that 99% of time he feels great.  Past Medical History:  Diagnosis Date  . A-fib ([place])   . Allergy to bee sting   . Diabetes mellitus   . High blood pressure   . High cholesterol   . Stroke ([place])   . Weight loss     Past Surgical History:  Procedure Laterality Date  . ARTERIAL BYPASS SURGRY    . CARDIAC SURGERY    . CORONARY ARTERY BYPASS GRAFT    . CORONARY STENT PLACEMENT      Current Medications: Current Meds  Medication Sig  . amLODipine (NORVASC) 5 MG tablet Take 5 mg by mouth daily.   Marland Kitchen aspirin EC 81 MG tablet Take 81 mg by mouth daily.   Marland Kitchen atorvastatin (LIPITOR) 80 MG tablet Take 80 mg by mouth daily.   Marland Kitchen EPINEPHrine (EPIPEN 2-PAK) 0.3 mg/0.3 mL IJ SOAJ injection Use as directed for life-threatening allergic reaction.  Marland Kitchen glimepiride (AMARYL) 2 MG tablet Take 2 mg by mouth daily.   Marland Kitchen losartan (COZAAR) 100 MG tablet Take 1 tablet (100 mg total) by mouth daily.  . metFORMIN (GLUCOPHAGE) 1000 MG tablet Take 1,000 mg by mouth 2 (two) times daily.  . metoprolol succinate (TOPROL-XL) 100 MG 24 hr tablet Take 1 tablet (100 mg total) by mouth daily. Take with or immediately following a meal.  . Multiple Vitamin (MULTI-VITAMINS) TABS Take 1 tablet by mouth daily.   . nitroGLYCERIN (NITROSTAT) 0.4 MG SL tablet Place 0.4 mg under the tongue every 5 (five) minutes as needed.   . rivaroxaban (XARELTO) 20 MG TABS tablet Take 1 tablet (20 mg total) by mouth daily.  . saxagliptin  HCl (ONGLYZA) 5 MG TABS tablet Take 5 mg by mouth daily.      Allergies:   Bee venom   Social History   Socioeconomic History  . Marital status: Married    Spouse name: Not on file  . Number of children: Not on file  . Years of education: Not on file  . Highest education level: Not on file  Occupational History  . Not on file  Social Needs  . Financial resource strain: Not on file  . Food insecurity:    Worry: Not on file    Inability: Not on file  . Transportation needs:    Medical: Not on file    Non-medical: Not on file  Tobacco Use  . Smoking status: Former Smoker    Last attempt to quit: 02/25/1996    Years since quitting: 22.0  . Smokeless tobacco: Former Network engineer and Sexual Activity  . Alcohol use: No  . Drug use: No  . Sexual activity: Not on file  Lifestyle  . Physical activity:    Days per week: Not on file    Minutes per session: Not on file  . Stress: Not on file  Relationships  . Social connections:    Talks on  phone: Not on file    Gets together: Not on file    Attends religious service: Not on file    Active member of club or organization: Not on file    Attends meetings of clubs or organizations: Not on file    Relationship status: Not on file  Other Topics Concern  . Not on file  Social History Narrative  . Not on file     Family History: The patient's family history includes Cancer in his sister; Diabetes in his brother and mother; Heart attack in his brother; Heart disease in his brother and mother; High blood pressure in his brother and mother. ROS:   Please see the history of present illness.    All 14 point review of systems negative except as described per history of present illness  EKGs/Labs/Other Studies Reviewed:      Recent Labs: No results found for requested labs within last 8760 hours.  Recent Lipid Panel No results found for: CHOL, TRIG, HDL, CHOLHDL, VLDL, LDLCALC, LDLDIRECT  Physical Exam:    VS:  BP 120/70    Pulse 75   Ht 6\' 1"  (1.854 m)   Wt 268 lb 1.9 oz (121.6 kg)   SpO2 98%   BMI 35.37 kg/m     Wt Readings from Last 3 Encounters:  03/19/18 268 lb 1.9 oz (121.6 kg)  08/31/17 264 lb (119.7 kg)  02/25/16 246 lb 0.5 oz (111.6 kg)     GEN:  Well nourished, well developed in no acute distress HEENT: Normal NECK: No JVD; No carotid bruits LYMPHATICS: No lymphadenopathy CARDIAC: Irregularly irregular, no murmurs, no rubs, no gallops RESPIRATORY:  Clear to auscultation without rales, wheezing or rhonchi  ABDOMEN: Soft, non-tender, non-distended MUSCULOSKELETAL:  No edema; No deformity  SKIN: Warm and dry LOWER EXTREMITIES: no swelling NEUROLOGIC:  Alert and oriented x 3 PSYCHIATRIC:  Normal affect   ASSESSMENT:    1. Chronic atrial fibrillation ([place])   2. Coronary artery disease involving native coronary artery of native heart without angina pectoris   3. Type 2 diabetes mellitus without complication, without long-term current use of insulin ([place])   4. Dyslipidemia   5. Essential hypertension    PLAN:    In order of problems listed above:  1. Chronic atrial fibrillation rate irregular but controlled.  Anticoagulated which I will continue in spite of anticoagulation had some TIA before therefore he is also on aspirin.  We will continue present management. 2. Coronary artery disease: Stable no recent symptoms. 3. Type 2 diabetes: Well-controlled did see his primary care physician yesterday.  Stable 4. Dyslipidemia: Fasting lipid profile done yesterday will wait for results of it 5. Essential hypertension: Blood pressure well controlled we will continue present management   Medication Adjustments/Labs and Tests Ordered: Current medicines are reviewed at length with the patient today.  Concerns regarding medicines are outlined above.  No orders of the defined types were placed in this encounter.  Medication changes: No orders of the defined types were placed in this  encounter.   Signed, Park Liter, MD, [facility] 03/19/2018 9:12 AM    [place]

## 2018-03-19 NOTE — Patient Instructions (Signed)

## 2018-06-12 ENCOUNTER — Other Ambulatory Visit: Payer: Self-pay | Admitting: Cardiology

## 2018-07-19 ENCOUNTER — Other Ambulatory Visit: Payer: Self-pay | Admitting: Cardiology

## 2018-08-23 ENCOUNTER — Telehealth: Payer: Self-pay | Admitting: Cardiology

## 2018-08-23 ENCOUNTER — Other Ambulatory Visit: Payer: Self-pay | Admitting: Emergency Medicine

## 2018-08-23 MED ORDER — AMLODIPINE BESYLATE 5 MG PO TABS: 5.0000 mg | ORAL_TABLET | Freq: Every day | ORAL | 1 refills | Status: DC

## 2018-08-23 NOTE — Telephone Encounter (Signed)
Medication refilled

## 2018-08-23 NOTE — Telephone Encounter (Signed)
°  Only has one tablet left   1. Which medications need to be refilled? (please list name of each medication and dose if known) amlodopine 5mg   2. Which pharmacy/location (including street and city if local pharmacy) is medication to be sent to? [place] [facility]  3. Do they need a 30 day or 90 day supply? 90 day supply

## 2018-09-17 DIAGNOSIS — I482 Chronic atrial fibrillation, unspecified: Secondary | ICD-10-CM | POA: Diagnosis not present

## 2018-09-17 DIAGNOSIS — I10 Essential (primary) hypertension: Secondary | ICD-10-CM | POA: Diagnosis not present

## 2018-09-17 DIAGNOSIS — Z8249 Family history of ischemic heart disease and other diseases of the circulatory system: Secondary | ICD-10-CM

## 2018-09-17 DIAGNOSIS — E782 Mixed hyperlipidemia: Secondary | ICD-10-CM | POA: Diagnosis not present

## 2018-09-17 DIAGNOSIS — E119 Type 2 diabetes mellitus without complications: Secondary | ICD-10-CM | POA: Diagnosis not present

## 2018-09-17 DIAGNOSIS — Z23 Encounter for immunization: Secondary | ICD-10-CM | POA: Diagnosis not present

## 2018-09-17 DIAGNOSIS — I25119 Atherosclerotic heart disease of native coronary artery with unspecified angina pectoris: Secondary | ICD-10-CM | POA: Diagnosis not present

## 2018-09-17 HISTORY — DX: Family history of ischemic heart disease and other diseases of the circulatory system: Z82.49

## 2018-10-04 DIAGNOSIS — Z0000 Encounter for general adult medical examination without abnormal findings: Secondary | ICD-10-CM | POA: Diagnosis not present

## 2018-10-04 DIAGNOSIS — Z23 Encounter for immunization: Secondary | ICD-10-CM | POA: Diagnosis not present

## 2018-11-01 ENCOUNTER — Ambulatory Visit (INDEPENDENT_AMBULATORY_CARE_PROVIDER_SITE_OTHER): Payer: PPO | Admitting: Cardiology

## 2018-11-01 ENCOUNTER — Encounter: Payer: Self-pay | Admitting: Cardiology

## 2018-11-01 VITALS — BP 162/68 | HR 75 | Ht 73.0 in | Wt 261.0 lb

## 2018-11-01 DIAGNOSIS — I482 Chronic atrial fibrillation, unspecified: Secondary | ICD-10-CM | POA: Diagnosis not present

## 2018-11-01 DIAGNOSIS — I2510 Atherosclerotic heart disease of native coronary artery without angina pectoris: Secondary | ICD-10-CM

## 2018-11-01 DIAGNOSIS — E119 Type 2 diabetes mellitus without complications: Secondary | ICD-10-CM | POA: Diagnosis not present

## 2018-11-01 DIAGNOSIS — E785 Hyperlipidemia, unspecified: Secondary | ICD-10-CM | POA: Diagnosis not present

## 2018-11-01 DIAGNOSIS — G459 Transient cerebral ischemic attack, unspecified: Secondary | ICD-10-CM

## 2018-11-01 LAB — BASIC METABOLIC PANEL
BUN/Creatinine Ratio: 11 (ref 10–24)
BUN: 15 mg/dL (ref 8–27)
CALCIUM: 9.7 mg/dL (ref 8.6–10.2)
CHLORIDE: 103 mmol/L (ref 96–106)
CO2: 22 mmol/L (ref 20–29)
CREATININE: 1.4 mg/dL — AB (ref 0.76–1.27)
GFR calc Af Amer: 59 mL/min/{1.73_m2} — ABNORMAL LOW (ref >59)
GFR calc non Af Amer: 51 mL/min/{1.73_m2} — ABNORMAL LOW (ref >59)
Glucose: 159 mg/dL — ABNORMAL HIGH (ref 65–99)
POTASSIUM: 5 mmol/L (ref 3.5–5.2)
SODIUM: 140 mmol/L (ref 134–144)

## 2018-11-01 NOTE — Addendum Note (Signed)
Addended by: Aleatha Borer on: 11/01/2018 09:43 AM   Modules accepted: Orders

## 2018-11-01 NOTE — Patient Instructions (Signed)
Medication Instructions:  Your physician recommends that you continue on your current medications as directed. Please refer to the Current Medication list given to you today.  If you need a refill on your cardiac medications before your next appointment, please call your pharmacy.   Lab work: None ordered If you have labs (blood work) drawn today and your tests are completely normal, you will receive your results only by: Marland Kitchen MyChart Message (if you have MyChart) OR . A paper copy in the mail If you have any lab test that is abnormal or we need to change your treatment, we will call you to review the results.  Testing/Procedures: Your physician has requested that you have an echocardiogram. Echocardiography is a painless test that uses sound waves to create images of your heart. It provides your doctor with information about the size and shape of your heart and how well your heart's chambers and valves are working. This procedure takes approximately one hour. There are no restrictions for this procedure.  Your physician has requested that you have a carotid duplex. This test is an ultrasound of the carotid arteries in your neck. It looks at blood flow through these arteries that supply the brain with blood. Allow one hour for this exam. There are no restrictions or special instructions.  Follow-Up: At [facility], you and your health needs are our priority.  As part of our continuing mission to provide you with exceptional heart care, we have created designated Provider Care Teams.  These Care Teams include your primary Cardiologist (physician) and Advanced Practice Providers (APPs -  Physician Assistants and Nurse Practitioners) who all work together to provide you with the care you need, when you need it. You will need a follow up appointment in 2 months.  You may see  Jenne Campus or another member of our [employer] Provider Team in [place]: Shirlee More, MD . Jyl Heinz,  MD  Any Other Special Instructions Will Be Listed Below (If Applicable). You will be contacted by Neurology to set up an appointment.

## 2018-11-01 NOTE — Progress Notes (Signed)
Cardiology Office Note:    Date:  11/01/2018   ID:  Robert Spears, DOB 06/06/1951, MRN [id number]  PCP:  Algis Greenhouse, MD  Cardiologist:  Jenne Campus, MD    Referring MD: Algis Greenhouse, MD   Chief Complaint  Patient presents with  . Follow-up  I had another TIA  History of Present Illness:    Robert Spears is a 67 y.o. male with chronic atrial fibrillation, history of TIA, hypertension, diabetes, dyslipidemia, coronary artery disease comes to my office for regular follow-up about 2 weeks ago he was doing his radial activity of daily living and then started experiencing some strange sensation with the numbness of the left side and developed slurred speech intact sensation lasted for about 45 minutes he did not go to the hospital did not look for any help he said this is exactly the same thing happened previously he is anticoagulated on top of that he is on aspirin and it is secondary to the fact that he did have similar TIA-like symptoms even when he was on Xarelto.  The plan will be to refer him to neurology he used to see group here in the hospital but does not like to follow-up with them therefore we will refer him to [facility] neurology group.  I will schedule him to have cardiac ultrasounds as well as echocardiogram echocardiograms mostly because of weakness fatigue and tiredness.  He also noticed his blood pressure being elevated lately I check his Chem-7 and then decide about the addition of some medications to get his blood pressure but under control  Past Medical History:  Diagnosis Date  . A-fib ([place])   . Allergy to bee sting   . Diabetes mellitus   . High blood pressure   . High cholesterol   . Stroke ([place])   . Weight loss     Past Surgical History:  Procedure Laterality Date  . ARTERIAL BYPASS SURGRY    . CARDIAC SURGERY    . CORONARY ARTERY BYPASS GRAFT    . CORONARY STENT PLACEMENT      Current Medications: Current Meds  Medication Sig  .  amLODipine (NORVASC) 5 MG tablet Take 1 tablet (5 mg total) by mouth daily.  Marland Kitchen aspirin EC 81 MG tablet Take 81 mg by mouth daily.   Marland Kitchen atorvastatin (LIPITOR) 80 MG tablet Take 80 mg by mouth daily.   Marland Kitchen EPINEPHrine (EPIPEN 2-PAK) 0.3 mg/0.3 mL IJ SOAJ injection Use as directed for life-threatening allergic reaction.  Marland Kitchen glimepiride (AMARYL) 2 MG tablet Take 2 mg by mouth daily.   Marland Kitchen losartan (COZAAR) 100 MG tablet Take 1 tablet (100 mg total) by mouth daily.  . metFORMIN (GLUCOPHAGE) 1000 MG tablet Take 1,000 mg by mouth 2 (two) times daily.  . metoprolol succinate (TOPROL-XL) 100 MG 24 hr tablet TAKE 1 TABLET BY MOUTH ONCE DAILY TAKE  WITH  OR  IMMEDIATELY  FOLLOWING  A  MEAL  . Multiple Vitamin (MULTI-VITAMINS) TABS Take 1 tablet by mouth daily.   . nitroGLYCERIN (NITROSTAT) 0.4 MG SL tablet Place 0.4 mg under the tongue every 5 (five) minutes as needed.   Alveda Reasons 20 MG TABS tablet TAKE 1 TABLET BY MOUTH ONCE DAILY     Allergies:   Bee venom   Social History   Socioeconomic History  . Marital status: Married    Spouse name: Not on file  . Number of children: Not on file  . Years of education: Not on  file  . Highest education level: Not on file  Occupational History  . Not on file  Social Needs  . Financial resource strain: Not on file  . Food insecurity:    Worry: Not on file    Inability: Not on file  . Transportation needs:    Medical: Not on file    Non-medical: Not on file  Tobacco Use  . Smoking status: Former Smoker    Last attempt to quit: 02/25/1996    Years since quitting: 22.6  . Smokeless tobacco: Former Network engineer and Sexual Activity  . Alcohol use: No  . Drug use: No  . Sexual activity: Not on file  Lifestyle  . Physical activity:    Days per week: Not on file    Minutes per session: Not on file  . Stress: Not on file  Relationships  . Social connections:    Talks on phone: Not on file    Gets together: Not on file    Attends religious service: Not  on file    Active member of club or organization: Not on file    Attends meetings of clubs or organizations: Not on file    Relationship status: Not on file  Other Topics Concern  . Not on file  Social History Narrative  . Not on file     Family History: The patient's family history includes Cancer in his sister; Diabetes in his brother and mother; Heart attack in his brother; Heart disease in his brother and mother; High blood pressure in his brother and mother. ROS:   Please see the history of present illness.    All 14 point review of systems negative except as described per history of present illness  EKGs/Labs/Other Studies Reviewed:      Recent Labs: No results found for requested labs within last 8760 hours.  Recent Lipid Panel No results found for: CHOL, TRIG, HDL, CHOLHDL, VLDL, LDLCALC, LDLDIRECT  Physical Exam:    VS:  BP (!) 162/68   Pulse 75   Ht 6\' 1"  (1.854 m)   Wt 261 lb (118.4 kg)   SpO2 98%   BMI 34.43 kg/m     Wt Readings from Last 3 Encounters:  11/01/18 261 lb (118.4 kg)  03/19/18 268 lb 1.9 oz (121.6 kg)  08/31/17 264 lb (119.7 kg)     GEN:  Well nourished, well developed in no acute distress HEENT: Normal NECK: No JVD; No carotid bruits LYMPHATICS: No lymphadenopathy CARDIAC: RRR, no murmurs, no rubs, no gallops RESPIRATORY:  Clear to auscultation without rales, wheezing or rhonchi  ABDOMEN: Soft, non-tender, non-distended MUSCULOSKELETAL:  No edema; No deformity  SKIN: Warm and dry LOWER EXTREMITIES: no swelling NEUROLOGIC:  Alert and oriented x 3 PSYCHIATRIC:  Normal affect   ASSESSMENT:    1. Chronic atrial fibrillation   2. Coronary artery disease involving native coronary artery of native heart without angina pectoris   3. TIA (transient ischemic attack)   4. Type 2 diabetes mellitus without complication, without long-term current use of insulin ([place])   5. Dyslipidemia    PLAN:    In order of problems listed  above:  1. Chronic atrial fibrillation rate controlled anticoagulated with Xarelto and aspirin secondary to the fact that he had a TIA while being on Xarelto. 2. Coronary artery disease asymptomatic and appropriate medications. 3. TIA will be referred to neurology. 4. Type 2 diabetes apparently doing well from that point review. 5. Dyslipidemia continue with high intensity  statin.   Medication Adjustments/Labs and Tests Ordered: Current medicines are reviewed at length with the patient today.  Concerns regarding medicines are outlined above.  No orders of the defined types were placed in this encounter.  Medication changes: No orders of the defined types were placed in this encounter.   Signed, Park Liter, MD, [facility] 11/01/2018 9:24 AM    [place]

## 2018-11-01 NOTE — Addendum Note (Signed)
Addended by: Ashok Norris on: 11/01/2018 10:05 AM   Modules accepted: Orders

## 2018-11-02 ENCOUNTER — Encounter: Payer: Self-pay | Admitting: Neurology

## 2018-11-04 ENCOUNTER — Telehealth: Payer: Self-pay | Admitting: Emergency Medicine

## 2018-11-04 MED ORDER — AMLODIPINE BESYLATE 5 MG PO TABS: 10.0000 mg | ORAL_TABLET | Freq: Every day | ORAL | 1 refills | Status: DC

## 2018-11-04 NOTE — Telephone Encounter (Signed)
Informed patient of lab results and also informed him to increase amlodipine to 10 mg daily. Patient verbally understands

## 2018-11-18 ENCOUNTER — Ambulatory Visit (INDEPENDENT_AMBULATORY_CARE_PROVIDER_SITE_OTHER): Payer: PPO | Admitting: Cardiology

## 2018-11-18 ENCOUNTER — Telehealth: Payer: Self-pay | Admitting: Cardiology

## 2018-11-18 VITALS — BP 158/78 | HR 75 | Resp 16 | Ht 73.0 in | Wt 262.0 lb

## 2018-11-18 DIAGNOSIS — I482 Chronic atrial fibrillation, unspecified: Secondary | ICD-10-CM

## 2018-11-18 NOTE — Telephone Encounter (Signed)
Please see nurse visit

## 2018-11-18 NOTE — Patient Instructions (Signed)
Medication Instructions:  Your physician recommends that you continue on your current medications as directed. Please refer to the Current Medication list given to you today.  If you need a refill on your cardiac medications before your next appointment, please call your pharmacy.   Lab work: None.  If you have labs (blood work) drawn today and your tests are completely normal, you will receive your results only by: Marland Kitchen MyChart Message (if you have MyChart) OR . A paper copy in the mail If you have any lab test that is abnormal or we need to change your treatment, we will call you to review the results.  Testing/Procedures: None.  Follow-Up:  Follow up as previously advised with Dr. Agustin Cree.   Any Other Special Instructions Will Be Listed Below (If Applicable). None.

## 2018-11-18 NOTE — Telephone Encounter (Signed)
Informed wife to have patient come in for ekg today. She verbally understands he will be here at 2 pm today.

## 2018-11-18 NOTE — Progress Notes (Signed)
Patient came in for ekg per Dr. Bettina Gavia after phone call of feeling weak and shakey. Patient is found to be in atrial fibrillation with a controlled rate. Per Dr. Bettina Gavia patient advised to follow up with primary care provider and reach back out to [place] with any other concerns. Patient has follow u[p appointment with Dr. Agustin Cree on 01/03/2019.

## 2018-11-18 NOTE — Telephone Encounter (Signed)
Is really weak and feels hungry/thinks he's in afib

## 2018-11-18 NOTE — Telephone Encounter (Signed)
Office EKG

## 2018-11-18 NOTE — Telephone Encounter (Signed)
Patient's wife reports that the patient hasn't felt well since yesterday. He began feeling weak and shakey. His blood sugar yesterday was 146. His blood sugar today is 110, blood pressure 152/92 and heart rate 77. They are concerned this may be related to his a fib. Denies any palpitations, shortness of breath or chest pain. Will route to Dr. Bettina Gavia. For his recommendation.

## 2018-12-14 NOTE — Progress Notes (Signed)
NEUROLOGY CONSULTATION NOTE  Robert Spears MRN: [id number] DOB: [birth year]  Referring provider: Jenne Campus, MD Primary care provider: Teressa Lower, MD  Reason for consult:  TIA  HISTORY OF PRESENT ILLNESS: Robert Spears is a 67 year old left- handed male with chronic atrial fibrillation, hypertension, diabetes, dyslipidemia, CAD and history of TIA who follows up for transient ischemic attack.  History supplemented by cardiology note.  He is accompanied by his wife who also supplements history.  Current medications: Xarelto, ASA 81mg , Toprol ,metformin, losartan, Amaryl, atorvastatin 80mg , Norvasc  In early November, he developed slurred speech, difficulty getting words out, confusion and trace left-sided facial numbness but no associated headache, visual disturbance, facial droop or unilateral numbness and weakness of extremities.  Symptoms lasted about 45 minutes.  Afterwards, he was fatigued and went to sleep.  When he woke up, he felt well.  Balance is sometimes off.  He is scheduled for echocardiogram and carotid ultrasound scheduled next week.  Since then, amlodipine was increased.    BMP from 11/01/18 demonstrated glucose 159, BUN 15, Cr 1.40 and GFR 51.  Labs from 09/17/18 include LDL 105 and Hgb A1c 7.2  He had a similar spell in 2016, diagnosed as a TIA.  PAST MEDICAL HISTORY: Past Medical History:  Diagnosis Date  . A-fib ([place])   . Allergy to bee sting   . Diabetes mellitus   . High blood pressure   . High cholesterol   . Stroke ([place])   . Weight loss     PAST SURGICAL HISTORY: Past Surgical History:  Procedure Laterality Date  . ARTERIAL BYPASS SURGRY    . CARDIAC SURGERY    . CORONARY ARTERY BYPASS GRAFT    . CORONARY STENT PLACEMENT      MEDICATIONS: Current Outpatient Medications on File Prior to Visit  Medication Sig Dispense Refill  . amLODipine (NORVASC) 5 MG tablet Take 2 tablets (10 mg total) by mouth daily. 90 tablet 1  . aspirin EC 81 MG  tablet Take 81 mg by mouth daily.     Marland Kitchen atorvastatin (LIPITOR) 80 MG tablet Take 80 mg by mouth daily.     Marland Kitchen EPINEPHrine (EPIPEN 2-PAK) 0.3 mg/0.3 mL IJ SOAJ injection Use as directed for life-threatening allergic reaction. 2 Device 3  . glimepiride (AMARYL) 2 MG tablet Take 2 mg by mouth daily.     Marland Kitchen losartan (COZAAR) 100 MG tablet Take 1 tablet (100 mg total) by mouth daily. 90 tablet 3  . metFORMIN (GLUCOPHAGE) 1000 MG tablet Take 1,000 mg by mouth 2 (two) times daily.    . metoprolol succinate (TOPROL-XL) 100 MG 24 hr tablet TAKE 1 TABLET BY MOUTH ONCE DAILY TAKE  WITH  OR  IMMEDIATELY  FOLLOWING  A  MEAL 90 tablet 2  . Multiple Vitamin (MULTI-VITAMINS) TABS Take 1 tablet by mouth daily.     . nitroGLYCERIN (NITROSTAT) 0.4 MG SL tablet Place 0.4 mg under the tongue every 5 (five) minutes as needed.     Alveda Reasons 20 MG TABS tablet TAKE 1 TABLET BY MOUTH ONCE DAILY 30 tablet 8   No current facility-administered medications on file prior to visit.     ALLERGIES: Allergies  Allergen Reactions  . Bee Venom     FAMILY HISTORY: Family History  Problem Relation Age of Onset  . Cancer Sister   . Diabetes Mother   . High blood pressure Mother   . Heart disease Mother   . Heart attack Brother   .  Diabetes Brother   . High blood pressure Brother   . Heart disease Brother     SOCIAL HISTORY: Social History   Socioeconomic History  . Marital status: Married    Spouse name: Not on file  . Number of children: Not on file  . Years of education: Not on file  . Highest education level: Not on file  Occupational History  . Not on file  Social Needs  . Financial resource strain: Not on file  . Food insecurity:    Worry: Not on file    Inability: Not on file  . Transportation needs:    Medical: Not on file    Non-medical: Not on file  Tobacco Use  . Smoking status: Former Smoker    Last attempt to quit: 02/25/1996    Years since quitting: 22.8  . Smokeless tobacco: Former Dance movement psychotherapist and Sexual Activity  . Alcohol use: No  . Drug use: No  . Sexual activity: Not on file  Lifestyle  . Physical activity:    Days per week: Not on file    Minutes per session: Not on file  . Stress: Not on file  Relationships  . Social connections:    Talks on phone: Not on file    Gets together: Not on file    Attends religious service: Not on file    Active member of club or organization: Not on file    Attends meetings of clubs or organizations: Not on file    Relationship status: Not on file  . Intimate partner violence:    Fear of current or ex partner: Not on file    Emotionally abused: Not on file    Physically abused: Not on file    Forced sexual activity: Not on file  Other Topics Concern  . Not on file  Social History Narrative  . Not on file    REVIEW OF SYSTEMS: Constitutional: No fevers, chills, or sweats, no generalized fatigue, change in appetite Eyes: No visual changes, double vision, eye pain Ear, nose and throat: No hearing loss, ear pain, nasal congestion, sore throat Cardiovascular: No chest pain, palpitations Respiratory:  No shortness of breath at rest or with exertion, wheezes GastrointestinaI: No nausea, vomiting, diarrhea, abdominal pain, fecal incontinence Genitourinary:  No dysuria, urinary retention or frequency Musculoskeletal:  No neck pain, back pain Integumentary: No rash, pruritus, skin lesions Neurological: as above Psychiatric: No depression, insomnia, anxiety Endocrine: No palpitations, fatigue, diaphoresis, mood swings, change in appetite, change in weight, increased thirst Hematologic/Lymphatic:  No purpura, petechiae. Allergic/Immunologic: no itchy/runny eyes, nasal congestion, recent allergic reactions, rashes  PHYSICAL EXAM: Blood pressure (!) 148/94, pulse 71, height 6\' 1"  (1.854 m), weight 261 lb (118.4 kg), SpO2 98 %. General: No acute distress.  Patient appears well-groomed.  Head:  Normocephalic/atraumatic Eyes:   fundi examined but not visualized Neck: supple, no paraspinal tenderness, full range of motion Back: No paraspinal tenderness Heart: regular rate and rhythm Lungs: Clear to auscultation bilaterally. Vascular: No carotid bruits. Neurological Exam: Mental status: alert and oriented to person, place, and time, recent and remote memory intact, fund of knowledge intact, attention and concentration intact, speech fluent and not dysarthric, language intact. Cranial nerves: CN I: not tested CN II: pupils equal, round and reactive to light, visual fields intact CN III, IV, VI:  full range of motion, no nystagmus, no ptosis CN V: facial sensation intact CN VII: upper and lower face symmetric CN VIII: hearing intact CN  IX, X: gag intact, uvula midline CN XI: sternocleidomastoid and trapezius muscles intact CN XII: tongue midline Bulk & Tone: normal, no fasciculations. Motor:  5/5 throughout  Sensation:  Pinprick and vibration sensation reduced in feet. Deep Tendon Reflexes:  2+ throughout except absent in ankles, toes downgoing.  Finger to nose testing:  Without dysmetria.  Heel to shin:  Without dysmetria.  Gait:  Normal station and stride.  Able to turn and tandem walk. Romberg with sway.  IMPRESSION: 1.  Transient ischemic attack 2.  Atrial fibrillation 3.  Type 2 diabetes mellitus 4.  Hypertension 5.  Hyperlipidemia 6.  Coronary artery disease  PLAN: 1.  MRI of brain and MRA of head without contrast 2.  Carotid doppler and echocardiogram already scheduled for next week 4.  Continue aspirin 81 mg daily.  Since he had a TIA event on Xarelto, perhaps cardiology would consider switching from Xarelto to another anticoagulant. 5.  Continue statin therapy.  LDL goal is less than 70.  PCP may want to consider switching to another statin such as Crestor. 6.  Optimize glycemic control.  Hgb A1c goal less than 6.5. 7.  Optimize blood pressure control 8.  Exercise and Mediterranean diet 9.   Follow up in 5 months.  Thank you for allowing me to take part in the care of this patient.  Metta Clines, DO  CC:  Teressa Lower, MD  Jenne Campus, MD

## 2018-12-16 ENCOUNTER — Encounter: Payer: Self-pay | Admitting: Neurology

## 2018-12-16 ENCOUNTER — Ambulatory Visit (INDEPENDENT_AMBULATORY_CARE_PROVIDER_SITE_OTHER): Payer: PPO | Admitting: Neurology

## 2018-12-16 VITALS — BP 148/94 | HR 71 | Ht 73.0 in | Wt 261.0 lb

## 2018-12-16 DIAGNOSIS — I2510 Atherosclerotic heart disease of native coronary artery without angina pectoris: Secondary | ICD-10-CM

## 2018-12-16 DIAGNOSIS — I10 Essential (primary) hypertension: Secondary | ICD-10-CM | POA: Diagnosis not present

## 2018-12-16 DIAGNOSIS — G459 Transient cerebral ischemic attack, unspecified: Secondary | ICD-10-CM | POA: Diagnosis not present

## 2018-12-16 DIAGNOSIS — I482 Chronic atrial fibrillation, unspecified: Secondary | ICD-10-CM

## 2018-12-16 DIAGNOSIS — E119 Type 2 diabetes mellitus without complications: Secondary | ICD-10-CM

## 2018-12-16 DIAGNOSIS — E785 Hyperlipidemia, unspecified: Secondary | ICD-10-CM | POA: Diagnosis not present

## 2018-12-16 NOTE — Patient Instructions (Addendum)
1.  We will check MRI and MRA of head 2.  Follow up for testing next week 3.  Continue aspirin 81mg  daily.  Continue Xarelto but your cardiologist may want to consider switching to another blood thinner 4.  Will get labs from Dr. Garlon Hatchet 5.  Follow Mediterranean diet (see below) 6.  Exercise 7.  Follow up in 5 months.  We have sent a referral to [facility] for your MRI/MRA and they will call you directly to schedule your appt. They are located at [place]. If you need to contact them directly please call [phone].    Mediterranean Diet A Mediterranean diet refers to food and lifestyle choices that are based on the traditions of countries located on the [employer]. This way of eating has been shown to help prevent certain conditions and improve outcomes for people who have chronic diseases, like kidney disease and heart disease. What are tips for following this plan? Lifestyle  Cook and eat meals together with your family, when possible.  Drink enough fluid to keep your urine clear or pale yellow.  Be physically active every day. This includes: ? Aerobic exercise like running or swimming. ? Leisure activities like gardening, walking, or housework.  Get 7-8 hours of sleep each night.  If recommended by your health care provider, drink red wine in moderation. This means 1 glass a day for nonpregnant women and 2 glasses a day for men. A glass of wine equals 5 oz (150 mL). Reading food labels   Check the serving size of packaged foods. For foods such as rice and pasta, the serving size refers to the amount of cooked product, not dry.  Check the total fat in packaged foods. Avoid foods that have saturated fat or trans fats.  Check the ingredients list for added sugars, such as corn syrup. Shopping  At the grocery store, buy most of your food from the areas near the walls of the store. This includes: ? Fresh fruits and vegetables (produce). ? Grains,  beans, nuts, and seeds. Some of these may be available in unpackaged forms or large amounts (in bulk). ? Fresh seafood. ? Poultry and eggs. ? Low-fat dairy products.  Buy whole ingredients instead of prepackaged foods.  Buy fresh fruits and vegetables in-season from local farmers markets.  Buy frozen fruits and vegetables in resealable bags.  If you do not have access to quality fresh seafood, buy precooked frozen shrimp or canned fish, such as tuna, salmon, or sardines.  Buy small amounts of raw or cooked vegetables, salads, or olives from the deli or salad bar at your store.  Stock your pantry so you always have certain foods on hand, such as olive oil, canned tuna, canned tomatoes, rice, pasta, and beans. Cooking  Cook foods with extra-virgin olive oil instead of using butter or other vegetable oils.  Have meat as a side dish, and have vegetables or grains as your main dish. This means having meat in small portions or adding small amounts of meat to foods like pasta or stew.  Use beans or vegetables instead of meat in common dishes like chili or lasagna.  Experiment with different cooking methods. Try roasting or broiling vegetables instead of steaming or sauteing them.  Add frozen vegetables to soups, stews, pasta, or rice.  Add nuts or seeds for added healthy fat at each meal. You can add these to yogurt, salads, or vegetable dishes.  Marinate fish or vegetables using olive oil, lemon juice,  garlic, and fresh herbs. Meal planning   Plan to eat 1 vegetarian meal one day each week. Try to work up to 2 vegetarian meals, if possible.  Eat seafood 2 or more times a week.  Have healthy snacks readily available, such as: ? Vegetable sticks with hummus. ? [place] yogurt. ? Fruit and nut trail mix.  Eat balanced meals throughout the week. This includes: ? Fruit: 2-3 servings a day ? Vegetables: 4-5 servings a day ? Low-fat dairy: 2 servings a day ? Fish, poultry, or lean  meat: 1 serving a day ? Beans and legumes: 2 or more servings a week ? Nuts and seeds: 1-2 servings a day ? Whole grains: 6-8 servings a day ? Extra-virgin olive oil: 3-4 servings a day  Limit red meat and sweets to only a few servings a month What are my food choices?  Mediterranean diet ? Recommended ? Grains: Whole-grain pasta. Brown rice. Bulgar wheat. Polenta. Couscous. Whole-wheat bread. [place]. ? Vegetables: Artichokes. Beets. Broccoli. Cabbage. Carrots. Eggplant. Green beans. Chard. Kale. Spinach. Onions. Leeks. Peas. Squash. Tomatoes. Peppers. Radishes. ? Fruits: Apples. Apricots. Avocado. Berries. Bananas. Cherries. Dates. Figs. Grapes. Lemons. Melon. Oranges. Peaches. Plums. Pomegranate. ? Meats and other protein foods: Beans. Almonds. Sunflower seeds. Pine nuts. Peanuts. [place]. Salmon. Scallops. Shrimp. [place]. Tilapia. Clams. Oysters. Eggs. ? Dairy: Low-fat milk. Cheese. Greek yogurt. ? Beverages: Water. Red wine. Herbal tea. ? Fats and oils: Extra virgin olive oil. Avocado oil. Grape seed oil. ? Sweets and desserts: [place] yogurt with honey. Baked apples. Poached pears. Trail mix. ? Seasoning and other foods: Basil. Cilantro. Coriander. Cumin. Mint. Parsley. Sage. Rosemary. Tarragon. Garlic. Oregano. Thyme. Pepper. Balsalmic vinegar. Tahini. Hummus. Tomato sauce. Olives. Mushrooms. ? Limit these ? Grains: Prepackaged pasta or rice dishes. Prepackaged cereal with added sugar. ? Vegetables: Deep fried potatoes (french fries). ? Fruits: Fruit canned in syrup. ? Meats and other protein foods: Beef. Pork. Lamb. Poultry with skin. Hot dogs. [place] Salines. ? Dairy: Ice cream. Sour cream. Whole milk. ? Beverages: Juice. Sugar-sweetened soft drinks. Beer. Liquor and spirits. ? Fats and oils: Butter. Canola oil. Vegetable oil. Beef fat (tallow). Lard. ? Sweets and desserts: Cookies. Cakes. Pies. Candy. ? Seasoning and other foods: Mayonnaise. Premade sauces and marinades. ? The items  listed may not be a complete list. Talk with your dietitian about what dietary choices are right for you. Summary  The Mediterranean diet includes both food and lifestyle choices.  Eat a variety of fresh fruits and vegetables, beans, nuts, seeds, and whole grains.  Limit the amount of red meat and sweets that you eat.  Talk with your health care provider about whether it is safe for you to drink red wine in moderation. This means 1 glass a day for nonpregnant women and 2 glasses a day for men. A glass of wine equals 5 oz (150 mL). This information is not intended to replace advice given to you by your health care provider. Make sure you discuss any questions you have with your health care provider. Document Released: 07/24/2016 Document Revised: 08/26/2016 Document Reviewed: 07/24/2016 Elsevier Interactive Patient Education  2019 [employer].

## 2018-12-21 ENCOUNTER — Ambulatory Visit (INDEPENDENT_AMBULATORY_CARE_PROVIDER_SITE_OTHER): Payer: PPO

## 2018-12-21 ENCOUNTER — Other Ambulatory Visit: Payer: Self-pay

## 2018-12-21 DIAGNOSIS — I2510 Atherosclerotic heart disease of native coronary artery without angina pectoris: Secondary | ICD-10-CM | POA: Diagnosis not present

## 2018-12-21 DIAGNOSIS — I482 Chronic atrial fibrillation, unspecified: Secondary | ICD-10-CM

## 2018-12-21 DIAGNOSIS — G459 Transient cerebral ischemic attack, unspecified: Secondary | ICD-10-CM

## 2018-12-21 NOTE — Progress Notes (Signed)
Carotid duplex exam has been performed. Bilateral atherosclerosis, No ICA stenosis.   Jimmy Indyah Saulnier RDCS, RVT

## 2018-12-21 NOTE — Progress Notes (Signed)
Complete echocardiogram has been performed.  Jimmy Farrie Sann RDCS, RVT 

## 2018-12-25 ENCOUNTER — Ambulatory Visit
Admission: RE | Admit: 2018-12-25 | Discharge: 2018-12-25 | Disposition: A | Discharge status: Home / Self Care | Payer: PPO | Source: Ambulatory Visit | Attending: Neurology | Admitting: Neurology

## 2018-12-25 DIAGNOSIS — G459 Transient cerebral ischemic attack, unspecified: Secondary | ICD-10-CM

## 2018-12-25 DIAGNOSIS — R4789 Other speech disturbances: Secondary | ICD-10-CM | POA: Diagnosis not present

## 2018-12-28 ENCOUNTER — Telehealth: Payer: Self-pay

## 2018-12-28 NOTE — Telephone Encounter (Signed)
Called and advised Pt of results and recommendations

## 2018-12-28 NOTE — Telephone Encounter (Signed)
-----   Message from Alda Berthold, DO sent at 12/27/2018  7:53 AM EST ----- Please inform patient that his MRI brain does not show signs of recent stroke. He does have evidence of age-related changes including volume loss.  It is important to stay active, keep diabetes, cholesterol, and blood pressure well-controlled to reduce risk of future TIA/stroke. Thanks.

## 2019-01-03 ENCOUNTER — Ambulatory Visit: Payer: PPO | Admitting: Cardiology

## 2019-01-27 ENCOUNTER — Ambulatory Visit (INDEPENDENT_AMBULATORY_CARE_PROVIDER_SITE_OTHER): Payer: PPO | Admitting: Cardiology

## 2019-01-27 ENCOUNTER — Encounter: Payer: Self-pay | Admitting: Cardiology

## 2019-01-27 VITALS — BP 150/80 | HR 98 | Ht 73.0 in | Wt 261.6 lb

## 2019-01-27 DIAGNOSIS — I482 Chronic atrial fibrillation, unspecified: Secondary | ICD-10-CM

## 2019-01-27 DIAGNOSIS — I2510 Atherosclerotic heart disease of native coronary artery without angina pectoris: Secondary | ICD-10-CM | POA: Diagnosis not present

## 2019-01-27 DIAGNOSIS — I10 Essential (primary) hypertension: Secondary | ICD-10-CM | POA: Diagnosis not present

## 2019-01-27 DIAGNOSIS — E785 Hyperlipidemia, unspecified: Secondary | ICD-10-CM | POA: Diagnosis not present

## 2019-01-27 DIAGNOSIS — G459 Transient cerebral ischemic attack, unspecified: Secondary | ICD-10-CM | POA: Diagnosis not present

## 2019-01-27 MED ORDER — NITROGLYCERIN 0.4 MG SL SUBL: 0.4000 mg | SUBLINGUAL_TABLET | SUBLINGUAL | 5 refills | Status: DC | PRN

## 2019-01-27 MED ORDER — APIXABAN 5 MG PO TABS: 5.0000 mg | ORAL_TABLET | Freq: Two times a day (BID) | ORAL | 1 refills | Status: DC

## 2019-01-27 NOTE — Progress Notes (Signed)
Cardiology Office Note:    Date:  01/27/2019   ID:  Robert Spears, DOB [birth year], MRN [id number]  PCP:  Algis Greenhouse, MD  Cardiologist:  Jenne Campus, MD    Referring MD: Algis Greenhouse, MD   Chief Complaint  Patient presents with  . 2 month follow up  Doing well  History of Present Illness:    Robert Spears is a 68 y.o. male with coronary artery disease TIA diabetes hypertension comes today to my office for follow-up will doing great denies have any chest pain tightness squeezing pressure burning chest.  He did see neurology service and is very happy with it.  MRI MRa was done.  Doing fine 1 of the suggestion was to switch him to different anticoagulant agent he is on Xarelto right now I will switch him to Eliquis 5 mg twice daily.little bit better day 9 terms of effectiveness and potential side effect profile on Eliquis and Xarelto.  We talked about needs to exercise on a regular basis to make his diabetes better as well as blood pressure better.  Past Medical History:  Diagnosis Date  . A-fib ([place])   . Allergy to bee sting   . Diabetes mellitus   . High blood pressure   . High cholesterol   . Stroke ([place])   . Weight loss     Past Surgical History:  Procedure Laterality Date  . ARTERIAL BYPASS SURGRY    . CARDIAC SURGERY    . CORONARY ARTERY BYPASS GRAFT    . CORONARY STENT PLACEMENT      Current Medications: Current Meds  Medication Sig  . amLODipine (NORVASC) 5 MG tablet Take 2 tablets (10 mg total) by mouth daily.  Marland Kitchen aspirin EC 81 MG tablet Take 81 mg by mouth daily.   Marland Kitchen atorvastatin (LIPITOR) 80 MG tablet Take 80 mg by mouth daily.   Marland Kitchen EPINEPHrine (EPIPEN 2-PAK) 0.3 mg/0.3 mL IJ SOAJ injection Use as directed for life-threatening allergic reaction.  Marland Kitchen glimepiride (AMARYL) 2 MG tablet Take 2 mg by mouth daily.   Marland Kitchen losartan (COZAAR) 100 MG tablet Take 1 tablet (100 mg total) by mouth daily.  . metFORMIN (GLUCOPHAGE) 1000 MG tablet Take 1,000 mg by  mouth 2 (two) times daily.  . metoprolol succinate (TOPROL-XL) 100 MG 24 hr tablet TAKE 1 TABLET BY MOUTH ONCE DAILY TAKE  WITH  OR  IMMEDIATELY  FOLLOWING  A  MEAL  . Multiple Vitamin (MULTI-VITAMINS) TABS Take 1 tablet by mouth daily.   . nitroGLYCERIN (NITROSTAT) 0.4 MG SL tablet Place 0.4 mg under the tongue every 5 (five) minutes as needed.   Alveda Reasons 20 MG TABS tablet TAKE 1 TABLET BY MOUTH ONCE DAILY     Allergies:   Bee venom   Social History   Socioeconomic History  . Marital status: Married    Spouse name: [name]  . Number of children: 2  . Years of education: Not on file  . Highest education level: Associate degree: occupational, Hotel manager, or vocational program  Occupational History  . Occupation: retired  Scientific laboratory technician  . Financial resource strain: Not on file  . Food insecurity:    Worry: Not on file    Inability: Not on file  . Transportation needs:    Medical: Not on file    Non-medical: Not on file  Tobacco Use  . Smoking status: Former Smoker    Last attempt to quit: 02/25/1996    Years since quitting: 22.9  .  Smokeless tobacco: Former Network engineer and Sexual Activity  . Alcohol use: No  . Drug use: No  . Sexual activity: Not on file  Lifestyle  . Physical activity:    Days per week: Not on file    Minutes per session: Not on file  . Stress: Not on file  Relationships  . Social connections:    Talks on phone: Not on file    Gets together: Not on file    Attends religious service: Not on file    Active member of club or organization: Not on file    Attends meetings of clubs or organizations: Not on file    Relationship status: Not on file  Other Topics Concern  . Not on file  Social History Narrative   Patient is left-handed. He lives with his wife in a one level home. He drinks 2 cups of coffee a day and an occasional diet soda. He does not regularly exercise.     Family History: The patient's family history includes Cancer in his sister;  Diabetes in his brother, mother, and sister; Heart attack in his brother; Heart disease in his brother and sister; Heart disease (age of onset: 57) in his mother; High blood pressure in his brother and mother; Other (age of onset: 8) in his father. ROS:   Please see the history of present illness.    All 14 point review of systems negative except as described per history of present illness  EKGs/Labs/Other Studies Reviewed:      Recent Labs: 11/01/2018: BUN 15; Creatinine, Ser 1.40; Potassium 5.0; Sodium 140  Recent Lipid Panel No results found for: CHOL, TRIG, HDL, CHOLHDL, VLDL, LDLCALC, LDLDIRECT  Physical Exam:    VS:  BP (!) 150/80   Pulse 98   Ht 6\' 1"  (1.854 m)   Wt 261 lb 9.6 oz (118.7 kg)   SpO2 98%   BMI 34.51 kg/m     Wt Readings from Last 3 Encounters:  01/27/19 261 lb 9.6 oz (118.7 kg)  12/16/18 261 lb (118.4 kg)  11/18/18 262 lb (118.8 kg)     GEN:  Well nourished, well developed in no acute distress HEENT: Normal NECK: No JVD; No carotid bruits LYMPHATICS: No lymphadenopathy CARDIAC: Irregularly irregular, no murmurs, no rubs, no gallops RESPIRATORY:  Clear to auscultation without rales, wheezing or rhonchi  ABDOMEN: Soft, non-tender, non-distended MUSCULOSKELETAL:  No edema; No deformity  SKIN: Warm and dry LOWER EXTREMITIES: no swelling NEUROLOGIC:  Alert and oriented x 3 PSYCHIATRIC:  Normal affect   ASSESSMENT:    1. TIA (transient ischemic attack)   2. Coronary artery disease involving native coronary artery of native heart without angina pectoris   3. Chronic atrial fibrillation   4. Essential hypertension   5. Dyslipidemia    PLAN:    In order of problems listed above:  1. TIA denies have any new symptoms seen already by neurology service. 2. Coronary artery disease: Doing well from that point review we will continue present management. 3. Chronic atrial fibrillation rate control we will switch to Eliquis as suggested by neurology  service. 4. Essential hypertension mildly elevated today but he took some allergy medications normally without medication blood pressure is good. 5. Dyslipidemia on high intensity statin which I will continue.   Medication Adjustments/Labs and Tests Ordered: Current medicines are reviewed at length with the patient today.  Concerns regarding medicines are outlined above.  No orders of the defined types were placed in this encounter.  Medication changes: No orders of the defined types were placed in this encounter.   Signed, Park Liter, MD, [facility] 01/27/2019 [place]

## 2019-01-27 NOTE — Patient Instructions (Signed)
Medication Instructions:  Your physician has recommended you make the following change in your medication:  Stop: Xarelto  Start: Eliquis 5 mg twice daily If you need a refill on your cardiac medications before your next appointment, please call your pharmacy.   Lab work: None.  If you have labs (blood work) drawn today and your tests are completely normal, you will receive your results only by: Marland Kitchen MyChart Message (if you have MyChart) OR . A paper copy in the mail If you have any lab test that is abnormal or we need to change your treatment, we will call you to review the results.  Testing/Procedures: None.   Follow-Up: At [facility], you and your health needs are our priority.  As part of our continuing mission to provide you with exceptional heart care, we have created designated Provider Care Teams.  These Care Teams include your primary Cardiologist (physician) and Advanced Practice Providers (APPs -  Physician Assistants and Nurse Practitioners) who all work together to provide you with the care you need, when you need it. You will need a follow up appointment in 3 months.  Please call our office 2 months in advance to schedule this appointment.  You may see No primary care provider on file. or another member of our [employer] Provider Team in [place]: Shirlee More, MD . Jyl Heinz, MD  Any Other Special Instructions Will Be Listed Below (If Applicable).  Apixaban oral tablets What is this medicine? APIXABAN (a PIX a ban) is an anticoagulant (blood thinner). It is used to lower the chance of stroke in people with a medical condition called atrial fibrillation. It is also used to treat or prevent blood clots in the lungs or in the veins. This medicine may be used for other purposes; ask your health care provider or pharmacist if you have questions. COMMON BRAND NAME(S): Eliquis What should I tell my health care provider before I take this medicine? They need to know  if you have any of these conditions: -bleeding disorders -bleeding in the brain -blood in your stools (black or tarry stools) or if you have blood in your vomit -history of stomach bleeding -kidney disease -liver disease -mechanical heart valve -an unusual or allergic reaction to apixaban, other medicines, foods, dyes, or preservatives -pregnant or trying to get pregnant -breast-feeding How should I use this medicine? Take this medicine by mouth with a glass of water. Follow the directions on the prescription label. You can take it with or without food. If it upsets your stomach, take it with food. Take your medicine at regular intervals. Do not take it more often than directed. Do not stop taking except on your doctor's advice. Stopping this medicine may increase your risk of a blood clot. Be sure to refill your prescription before you run out of medicine. Talk to your pediatrician regarding the use of this medicine in children. Special care may be needed. Overdosage: If you think you have taken too much of this medicine contact a poison control center or emergency room at once. NOTE: This medicine is only for you. Do not share this medicine with others. What if I miss a dose? If you miss a dose, take it as soon as you can. If it is almost time for your next dose, take only that dose. Do not take double or extra doses. What may interact with this medicine? This medicine may interact with the following: -aspirin and aspirin-like medicines -certain medicines for fungal infections like ketoconazole  and itraconazole -certain medicines for seizures like carbamazepine and phenytoin -certain medicines that treat or prevent blood clots like warfarin, enoxaparin, and dalteparin -clarithromycin -NSAIDs, medicines for pain and inflammation, like ibuprofen or naproxen -rifampin -ritonavir -St. John's wort This list may not describe all possible interactions. Give your health care provider a list of  all the medicines, herbs, non-prescription drugs, or dietary supplements you use. Also tell them if you smoke, drink alcohol, or use illegal drugs. Some items may interact with your medicine. What should I watch for while using this medicine? Visit your healthcare professional for regular checks on your progress. You may need blood work done while you are taking this medicine. Your condition will be monitored carefully while you are receiving this medicine. It is important not to miss any appointments. Avoid sports and activities that might cause injury while you are using this medicine. Severe falls or injuries can cause unseen bleeding. Be careful when using sharp tools or knives. Consider using an Copy. Take special care brushing or flossing your teeth. Report any injuries, bruising, or red spots on the skin to your healthcare professional. If you are going to need surgery or other procedure, tell your healthcare professional that you are taking this medicine. Wear a medical ID bracelet or chain. Carry a card that describes your disease and details of your medicine and dosage times. What side effects may I notice from receiving this medicine? Side effects that you should report to your doctor or health care professional as soon as possible: -allergic reactions like skin rash, itching or hives, swelling of the face, lips, or tongue -signs and symptoms of bleeding such as bloody or black, tarry stools; red or dark-brown urine; spitting up blood or brown material that looks like coffee grounds; red spots on the skin; unusual bruising or bleeding from the eye, gums, or nose -signs and symptoms of a blood clot such as chest pain; shortness of breath; pain, swelling, or warmth in the leg -signs and symptoms of a stroke such as changes in vision; confusion; trouble speaking or understanding; severe headaches; sudden numbness or weakness of the face, arm or leg; trouble walking; dizziness; loss of  coordination This list may not describe all possible side effects. Call your doctor for medical advice about side effects. You may report side effects to FDA at 1-800-FDA-1088. Where should I keep my medicine? Keep out of the reach of children. Store at room temperature between 20 and 25 degrees C (68 and 77 degrees F). Throw away any unused medicine after the expiration date. NOTE: This sheet is a summary. It may not cover all possible information. If you have questions about this medicine, talk to your doctor, pharmacist, or health care provider.  2019 Elsevier/Gold Standard (2017-11-26 11:20:07)

## 2019-02-09 ENCOUNTER — Other Ambulatory Visit: Payer: Self-pay | Admitting: Cardiology

## 2019-03-01 ENCOUNTER — Other Ambulatory Visit: Payer: Self-pay | Admitting: Cardiology

## 2019-03-22 DIAGNOSIS — I129 Hypertensive chronic kidney disease with stage 1 through stage 4 chronic kidney disease, or unspecified chronic kidney disease: Secondary | ICD-10-CM | POA: Insufficient documentation

## 2019-03-22 DIAGNOSIS — N183 Chronic kidney disease, stage 3 unspecified: Secondary | ICD-10-CM

## 2019-03-22 HISTORY — DX: Hypertensive chronic kidney disease with stage 1 through stage 4 chronic kidney disease, or unspecified chronic kidney disease: I12.9

## 2019-03-22 HISTORY — DX: Chronic kidney disease, stage 3 unspecified: N18.30

## 2019-03-24 DIAGNOSIS — Z87891 Personal history of nicotine dependence: Secondary | ICD-10-CM | POA: Diagnosis not present

## 2019-03-24 DIAGNOSIS — E119 Type 2 diabetes mellitus without complications: Secondary | ICD-10-CM | POA: Diagnosis not present

## 2019-03-24 DIAGNOSIS — N5082 Scrotal pain: Secondary | ICD-10-CM | POA: Diagnosis not present

## 2019-03-24 DIAGNOSIS — G40009 Localization-related (focal) (partial) idiopathic epilepsy and epileptic syndromes with seizures of localized onset, not intractable, without status epilepticus: Secondary | ICD-10-CM | POA: Diagnosis not present

## 2019-03-24 DIAGNOSIS — I10 Essential (primary) hypertension: Secondary | ICD-10-CM | POA: Diagnosis not present

## 2019-03-24 DIAGNOSIS — E782 Mixed hyperlipidemia: Secondary | ICD-10-CM | POA: Diagnosis not present

## 2019-03-24 DIAGNOSIS — L89620 Pressure ulcer of left heel, unstageable: Secondary | ICD-10-CM | POA: Diagnosis not present

## 2019-03-24 DIAGNOSIS — N183 Chronic kidney disease, stage 3 (moderate): Secondary | ICD-10-CM | POA: Diagnosis not present

## 2019-03-24 DIAGNOSIS — I482 Chronic atrial fibrillation, unspecified: Secondary | ICD-10-CM | POA: Diagnosis not present

## 2019-03-24 DIAGNOSIS — I25119 Atherosclerotic heart disease of native coronary artery with unspecified angina pectoris: Secondary | ICD-10-CM | POA: Diagnosis not present

## 2019-03-24 DIAGNOSIS — C672 Malignant neoplasm of lateral wall of bladder: Secondary | ICD-10-CM | POA: Diagnosis not present

## 2019-03-24 DIAGNOSIS — I129 Hypertensive chronic kidney disease with stage 1 through stage 4 chronic kidney disease, or unspecified chronic kidney disease: Secondary | ICD-10-CM | POA: Diagnosis not present

## 2019-03-24 DIAGNOSIS — R600 Localized edema: Secondary | ICD-10-CM | POA: Diagnosis not present

## 2019-03-29 ENCOUNTER — Other Ambulatory Visit: Payer: Self-pay | Admitting: Cardiology

## 2019-04-18 ENCOUNTER — Telehealth: Payer: Self-pay | Admitting: Emergency Medicine

## 2019-04-18 NOTE — Telephone Encounter (Signed)
The patient agrees.    YOUR CARDIOLOGY TEAM HAS ARRANGED FOR AN E-VISIT FOR YOUR APPOINTMENT - PLEASE REVIEW IMPORTANT INFORMATION BELOW SEVERAL DAYS PRIOR TO YOUR APPOINTMENT  Due to the recent COVID-19 pandemic, we are transitioning in-person office visits to tele-medicine visits in an effort to decrease unnecessary exposure to our patients, their families, and staff. These visits are billed to your insurance just like a normal visit is. We also encourage you to sign up for MyChart if you have not already done so. You will need a smartphone if possible. For patients that do not have this, we can still complete the visit using a regular telephone but do prefer a smartphone to enable video when possible. You may have a family member that lives with you that can help. If possible, we also ask that you have a blood pressure cuff and scale at home to measure your blood pressure, heart rate and weight prior to your scheduled appointment. Patients with clinical needs that need an in-person evaluation and testing will still be able to come to the office if absolutely necessary. If you have any questions, feel free to call our office.   . IF USING DOXIMITY or [email or web address].[email or web address] - The staff will give you instructions on receiving your link to join the meeting the day of your visit.      2-3 DAYS BEFORE YOUR APPOINTMENT  You will receive a telephone call from one of our [facility] team members - your caller ID may say "Unknown caller." If this is a video visit, we will walk you through how to get the video launched on your phone. We will remind you check your blood pressure, heart rate and weight prior to your scheduled appointment. If you have an Apple Watch or Kardia, please upload any pertinent ECG strips the day before or morning of your appointment to [place]. Our staff will also make sure you have reviewed the consent and agree to move forward with your scheduled tele-health visit.     THE DAY OF YOUR  APPOINTMENT  Approximately 15 minutes prior to your scheduled appointment, you will receive a telephone call from one of [facility] team - your caller ID may say "Unknown caller."  Our staff will confirm medications, vital signs for the day and any symptoms you may be experiencing. Please have this information available prior to the time of visit start. It may also be helpful for you to have a pad of paper and pen handy for any instructions given during your visit. They will also walk you through joining the smartphone meeting if this is a video visit.    CONSENT FOR TELE-HEALTH VISIT - PLEASE REVIEW  I hereby voluntarily request, consent and authorize [facility] and its employed or contracted physicians, physician assistants, nurse practitioners or other licensed health care professionals (the Practitioner), to provide me with telemedicine health care services (the "Services") as deemed necessary by the treating Practitioner. I acknowledge and consent to receive the Services by the Practitioner via telemedicine. I understand that the telemedicine visit will involve communicating with the Practitioner through live audiovisual communication technology and the disclosure of certain medical information by electronic transmission. I acknowledge that I have been given the opportunity to request an in-person assessment or other available alternative prior to the telemedicine visit and am voluntarily participating in the telemedicine visit.  I understand that I have the right to withhold or withdraw my consent to the use of telemedicine in the course of my care at  any time, without affecting my right to future care or treatment, and that the Practitioner or I may terminate the telemedicine visit at any time. I understand that I have the right to inspect all information obtained and/or recorded in the course of the telemedicine visit and may receive copies of available information for a reasonable fee.  I  understand that some of the potential risks of receiving the Services via telemedicine include:  Marland Kitchen Delay or interruption in medical evaluation due to technological equipment failure or disruption; . Information transmitted may not be sufficient (e.g. poor resolution of images) to allow for appropriate medical decision making by the Practitioner; and/or  . In rare instances, security protocols could fail, causing a breach of personal health information.  Furthermore, I acknowledge that it is my responsibility to provide information about my medical history, conditions and care that is complete and accurate to the best of my ability. I acknowledge that Practitioner's advice, recommendations, and/or decision may be based on factors not within their control, such as incomplete or inaccurate data provided by me or distortions of diagnostic images or specimens that may result from electronic transmissions. I understand that the practice of medicine is not an exact science and that Practitioner makes no warranties or guarantees regarding treatment outcomes. I acknowledge that I will receive a copy of this consent concurrently upon execution via email to the email address I last provided but may also request a printed copy by calling the office of [facility].    I understand that my insurance will be billed for this visit.   I have read or had this consent read to me. . I understand the contents of this consent, which adequately explains the benefits and risks of the Services being provided via telemedicine.  . I have been provided ample opportunity to ask questions regarding this consent and the Services and have had my questions answered to my satisfaction. . I give my informed consent for the services to be provided through the use of telemedicine in my medical care  By participating in this telemedicine visit I agree to the above.

## 2019-04-25 ENCOUNTER — Other Ambulatory Visit: Payer: Self-pay | Admitting: Cardiology

## 2019-04-27 ENCOUNTER — Encounter: Payer: Self-pay | Admitting: Cardiology

## 2019-04-27 ENCOUNTER — Other Ambulatory Visit: Payer: Self-pay

## 2019-04-27 ENCOUNTER — Telehealth (INDEPENDENT_AMBULATORY_CARE_PROVIDER_SITE_OTHER): Payer: PPO | Admitting: Cardiology

## 2019-04-27 VITALS — BP 139/79 | Wt 260.0 lb

## 2019-04-27 DIAGNOSIS — I2510 Atherosclerotic heart disease of native coronary artery without angina pectoris: Secondary | ICD-10-CM

## 2019-04-27 DIAGNOSIS — E785 Hyperlipidemia, unspecified: Secondary | ICD-10-CM

## 2019-04-27 DIAGNOSIS — I10 Essential (primary) hypertension: Secondary | ICD-10-CM

## 2019-04-27 DIAGNOSIS — E119 Type 2 diabetes mellitus without complications: Secondary | ICD-10-CM

## 2019-04-27 DIAGNOSIS — I482 Chronic atrial fibrillation, unspecified: Secondary | ICD-10-CM

## 2019-04-27 MED ORDER — EZETIMIBE 10 MG PO TABS: 10.0000 mg | ORAL_TABLET | Freq: Every day | ORAL | 2 refills | Status: DC

## 2019-04-27 NOTE — Addendum Note (Signed)
Addended by: Ashok Norris on: 04/27/2019 09:55 AM   Modules accepted: Orders

## 2019-04-27 NOTE — Progress Notes (Signed)
Virtual Visit via Video Note   This visit type was conducted due to national recommendations for restrictions regarding the COVID-19 Pandemic (e.g. social distancing) in an effort to limit this patient's exposure and mitigate transmission in our community.  Due to his co-morbid illnesses, this patient is at least at moderate risk for complications without adequate follow up.  This format is felt to be most appropriate for this patient at this time.  All issues noted in this document were discussed and addressed.  A limited physical exam was performed with this format.  Please refer to the patient's chart for his consent to telehealth for [facility].  Evaluation Performed:  Follow-up visit  This visit type was conducted due to national recommendations for restrictions regarding the COVID-19 Pandemic (e.g. social distancing).  This format is felt to be most appropriate for this patient at this time.  All issues noted in this document were discussed and addressed.  No physical exam was performed (except for noted visual exam findings with Video Visits).  Please refer to the patient's chart (MyChart message for video visits and phone note for telephone visits) for the patient's consent to telehealth for [facility].  Date:  04/27/2019  ID: Robert Spears, [place] [birth year], MRN [id number]   Patient Location: [place] [address]   Provider location:   [place] Office  PCP:  Algis Greenhouse, MD  Cardiologist:  Jenne Campus, MD     Chief Complaint: Doing well  History of Present Illness:    Robert Spears is a 68 y.o. male  who presents via audio/video conferencing for a telehealth visit today.  With coronary artery disease, permanent atrial fibrillation, status post coronary artery bypass graft, diabetes, dyslipidemia does have a video visit with me today overall doing well denies having any chest pain tightness squeezing pressure burning chest.   Overall doing well.  He is keeping up with restriction of coronavirus he staying home with his wife.  He still take some walks and enjoyed doing it.   The patient does not have symptoms concerning for COVID-19 infection (fever, chills, cough, or new SHORTNESS OF BREATH).    Prior CV studies:   The following studies were reviewed today:  .     Past Medical History:  Diagnosis Date  . A-fib ([place])   . Allergy to bee sting   . Diabetes mellitus   . High blood pressure   . High cholesterol   . Stroke ([place])   . Weight loss     Past Surgical History:  Procedure Laterality Date  . ARTERIAL BYPASS SURGRY    . CARDIAC SURGERY    . CORONARY ARTERY BYPASS GRAFT    . CORONARY STENT PLACEMENT       Current Meds  Medication Sig  . amLODipine (NORVASC) 5 MG tablet TAKE 2 TABLETS BY MOUTH ONCE DAILY  . aspirin EC 81 MG tablet Take 81 mg by mouth daily.   Marland Kitchen atorvastatin (LIPITOR) 80 MG tablet Take 80 mg by mouth daily.   Marland Kitchen ELIQUIS 5 MG TABS tablet Take 1 tablet by mouth twice daily  . EPINEPHrine (EPIPEN 2-PAK) 0.3 mg/0.3 mL IJ SOAJ injection Use as directed for life-threatening allergic reaction.  Marland Kitchen glimepiride (AMARYL) 2 MG tablet Take 2 mg by mouth daily.   Marland Kitchen losartan (COZAAR) 100 MG tablet Take 1 tablet by mouth once daily  . metFORMIN (GLUCOPHAGE) 1000 MG tablet Take 1,000 mg by mouth 2 (  two) times daily.  . metoprolol succinate (TOPROL-XL) 100 MG 24 hr tablet TAKE 1 TABLET BY MOUTH ONCE DAILY WITH OR IMMEDIATELY FOLLOWING A MEAL  . Multiple Vitamin (MULTI-VITAMINS) TABS Take 1 tablet by mouth daily.   . nitroGLYCERIN (NITROSTAT) 0.4 MG SL tablet Place 1 tablet (0.4 mg total) under the tongue every 5 (five) minutes as needed.      Family History: The patient's family history includes Cancer in his sister; Diabetes in his brother, mother, and sister; Heart attack in his brother; Heart disease in his brother and sister; Heart disease (age of onset: 44) in his mother; High blood  pressure in his brother and mother; Other (age of onset: 50) in his father.   ROS:   Please see the history of present illness.     All other systems reviewed and are negative.   Labs/Other Tests and Data Reviewed:     Recent Labs: 11/01/2018: BUN 15; Creatinine, Ser 1.40; Potassium 5.0; Sodium 140  Recent Lipid Panel No results found for: CHOL, TRIG, HDL, CHOLHDL, VLDL, LDLCALC, LDLDIRECT    Exam:    Vital Signs:  BP 139/79   Wt 260 lb (117.9 kg)   BMI 34.30 kg/m     Wt Readings from Last 3 Encounters:  04/27/19 260 lb (117.9 kg)  01/27/19 261 lb 9.6 oz (118.7 kg)  12/16/18 261 lb (118.4 kg)     Well nourished, well developed in no acute distress. Alert awake oriented x3 happy to be able to see me over the video link and we had a nice conversation he is sitting in his living room.  No swelling of lower extremities no JVD  Diagnosis for this visit:   1. Coronary artery disease involving native coronary artery of native heart without angina pectoris   2. Essential hypertension   3. Chronic atrial fibrillation   4. Type 2 diabetes mellitus without complication, without long-term current use of insulin ([place])   5. Dyslipidemia      ASSESSMENT & PLAN:    1.  Coronary artery disease status post coronary bypass graft asymptomatic doing well from that point review. 2.  Essential hypertension blood pressure well controlled continue present medications. 3.  Permanent atrial fibrillation.  We will continue anticoagulation.  He did have history of CVA/TIA being followed by neurology.  He is on aspirin and Eliquis. 4.  Dyslipidemia last fasting lipid profile show LDL to be still elevated above 70 therefore I will add Zetia 10 mg daily to his medical regiment fasting lipid profile will be done within the next few months. 5.  Type 2 diabetes he admits that he does not keep with good diet and he admits also that his sugar is elevated.  We talked in length about this I told him that  exercises will be very helpful he understand he will try to do it.  COVID-19 Education: The signs and symptoms of COVID-19 were discussed with the patient and how to seek care for testing (follow up with PCP or arrange E-visit).  The importance of social distancing was discussed today.  Patient Risk:   After full review of this patients clinical status, I feel that they are at least moderate risk at this time.  Time:   Today, I have spent 18 minutes with the patient with telehealth technology discussing pt health issues.  I spent 5 minutes reviewing her chart before the visit.  Visit was finished at 9:40 AM.    Medication Adjustments/Labs and Tests Ordered:  Current medicines are reviewed at length with the patient today.  Concerns regarding medicines are outlined above.  No orders of the defined types were placed in this encounter.  Medication changes: No orders of the defined types were placed in this encounter.    Disposition: Follow-up in 4 months.  Will schedule him to have fasting lipid profile done in about 6 weeks.  Zetia 10 mg will be added to his medical regiment.  Signed, Park Liter, MD, [facility] 04/27/2019 9:41 AM    [place]

## 2019-04-27 NOTE — Patient Instructions (Signed)
Medication Instructions:  Your physician has recommended you make the following change in your medication:   Start: Zetia 10 mg daily   If you need a refill on your cardiac medications before your next appointment, please call your pharmacy.   Lab work: Your physician recommends that you return for lab work in 6 weeks fasting: lipids  If you have labs (blood work) drawn today and your tests are completely normal, you will receive your results only by: Marland Kitchen MyChart Message (if you have MyChart) OR . A paper copy in the mail If you have any lab test that is abnormal or we need to change your treatment, we will call you to review the results.  Testing/Procedures: None.   Follow-Up: At [facility], you and your health needs are our priority.  As part of our continuing mission to provide you with exceptional heart care, we have created designated Provider Care Teams.  These Care Teams include your primary Cardiologist (physician) and Advanced Practice Providers (APPs -  Physician Assistants and Nurse Practitioners) who all work together to provide you with the care you need, when you need it. You will need a follow up appointment in 4 months.  Please call our office 2 months in advance to schedule this appointment.  You may see No primary care provider on file. or another member of our [employer] Provider Team in [place]: Shirlee More, MD . Jyl Heinz, MD  Any Other Special Instructions Will Be Listed Below (If Applicable).  Ezetimibe Tablets What is this medicine? EZETIMIBE (ez ET i mibe) blocks the absorption of cholesterol from the stomach. It can help lower blood cholesterol for patients who are at risk of getting heart disease or a stroke. It is only for patients whose cholesterol level is not controlled by diet. This medicine may be used for other purposes; ask your health care provider or pharmacist if you have questions. COMMON BRAND NAME(S): Zetia What should I tell my  health care provider before I take this medicine? They need to know if you have any of these conditions: -liver disease -an unusual or allergic reaction to ezetimibe, medicines, foods, dyes, or preservatives -pregnant or trying to get pregnant -breast-feeding How should I use this medicine? Take this medicine by mouth with a glass of water. Follow the directions on the prescription label. This medicine can be taken with or without food. Take your doses at regular intervals. Do not take your medicine more often than directed. Talk to your pediatrician regarding the use of this medicine in children. Special care may be needed. Overdosage: If you think you have taken too much of this medicine contact a poison control center or emergency room at once. NOTE: This medicine is only for you. Do not share this medicine with others. What if I miss a dose? If you miss a dose, take it as soon as you can. If it is almost time for your next dose, take only that dose. Do not take double or extra doses. What may interact with this medicine? Do not take this medicine with any of the following medications: -fenofibrate -gemfibrozil This medicine may also interact with the following medications: -antacids -cyclosporine -herbal medicines like red yeast rice -other medicines to lower cholesterol or triglycerides This list may not describe all possible interactions. Give your health care provider a list of all the medicines, herbs, non-prescription drugs, or dietary supplements you use. Also tell them if you smoke, drink alcohol, or use illegal drugs. Some items  may interact with your medicine. What should I watch for while using this medicine? Visit your doctor or health care professional for regular checks on your progress. You will need to have your cholesterol levels checked. If you are also taking some other cholesterol medicines, you will also need to have tests to make sure your liver is working  properly. Tell your doctor or health care professional if you get any unexplained muscle pain, tenderness, or weakness, especially if you also have a fever and tiredness. You need to follow a low-cholesterol, low-fat diet while you are taking this medicine. This will decrease your risk of getting heart and blood vessel disease. Exercising and avoiding alcohol and smoking can also help. Ask your doctor or dietician for advice. What side effects may I notice from receiving this medicine? Side effects that you should report to your doctor or health care professional as soon as possible: -allergic reactions like skin rash, itching or hives, swelling of the face, lips, or tongue -dark yellow or brown urine -unusually weak or tired -yellowing of the skin or eyes Side effects that usually do not require medical attention (report to your doctor or health care professional if they continue or are bothersome): -diarrhea -dizziness -headache -stomach upset or pain This list may not describe all possible side effects. Call your doctor for medical advice about side effects. You may report side effects to FDA at 1-800-FDA-1088. Where should I keep my medicine? Keep out of the reach of children. Store at room temperature between 15 and 30 degrees C (59 and 86 degrees F). Protect from moisture. Keep container tightly closed. Throw away any unused medicine after the expiration date. NOTE: This sheet is a summary. It may not cover all possible information. If you have questions about this medicine, talk to your doctor, pharmacist, or health care provider.  2019 Elsevier/Gold Standard (2012-06-07 15:39:09)

## 2019-05-13 NOTE — Progress Notes (Signed)
Virtual Visit via Video Note The purpose of this virtual visit is to provide medical care while limiting exposure to the novel coronavirus.    Consent was obtained for video visit:  Yes.   Answered questions that patient had about telehealth interaction:  Yes.   I discussed the limitations, risks, security and privacy concerns of performing an evaluation and management service by telemedicine. I also discussed with the patient that there may be a patient responsible charge related to this service. The patient expressed understanding and agreed to proceed.  Pt location: Home Physician Location: Home Name of referring provider:  Algis Greenhouse, MD I connected with Robert Spears at patients initiation/request on 05/16/2019 at  2:30 PM EDT by video enabled telemedicine application and verified that I am speaking with the correct person using two identifiers. Pt MRN:  [id number] Pt DOB:  1951/07/10 Video Participants:  Robert Spears   History of Present Illness:  Robert Spears is a 68 year old left-handed man with chronic atrial fibrillation, hypertension, diabetes, dyslipidemia, CAD and history of TIA who follows up for transient ischemic attack.  UPDATE: He was switched from Xarelto to Eliquis.  Current medications: Eliquis, ASA 81mg , Toprol ,metformin, losartan, Amaryl, atorvastatin 80mg , Norvasc  2D Echocardiogram from 12/21/18 demonstrated EF 50-55%.  Carotid doppler from 12/21/18 demonstrated 1-39% bilateral ICA stenosis.  MRI and MRA of brain from 12/26/18 were personally reviewed and showed atrophy and chronic small vessel ischemic changes and hypoplastic left vertebral artery but no acute intracranial abnormality or large vessel occlusion or stenosis.  03/24/19 LABS:  LDL 84, HGB A1c 7.5  No recurrent TIAs.  HISTORY: In early November 2019, he developed slurred speech, difficulty getting words out, confusion and trace left-sided facial numbness but no associated headache, visual  disturbance, facial droop or unilateral numbness and weakness of extremities.  Symptoms lasted about 45 minutes.  Afterwards, he was fatigued and went to sleep.  When he woke up, he felt well.  Balance is sometimes off.  He is scheduled for echocardiogram and carotid ultrasound scheduled next week.  Since then, amlodipine was increased.    He had a similar spell in 2016, diagnosed as a TIA.  Past Medical History: Past Medical History:  Diagnosis Date  . A-fib ([place])   . Allergy to bee sting   . Diabetes mellitus   . High blood pressure   . High cholesterol   . Stroke ([place])   . Weight loss     Medications: Outpatient Encounter Medications as of 05/16/2019  Medication Sig  . amLODipine (NORVASC) 5 MG tablet TAKE 2 TABLETS BY MOUTH ONCE DAILY  . aspirin EC 81 MG tablet Take 81 mg by mouth daily.   Marland Kitchen atorvastatin (LIPITOR) 80 MG tablet Take 80 mg by mouth daily.   Marland Kitchen ELIQUIS 5 MG TABS tablet Take 1 tablet by mouth twice daily  . EPINEPHrine (EPIPEN 2-PAK) 0.3 mg/0.3 mL IJ SOAJ injection Use as directed for life-threatening allergic reaction.  Marland Kitchen ezetimibe (ZETIA) 10 MG tablet Take 1 tablet (10 mg total) by mouth daily.  Marland Kitchen glimepiride (AMARYL) 2 MG tablet Take 2 mg by mouth daily.   Marland Kitchen losartan (COZAAR) 100 MG tablet Take 1 tablet by mouth once daily  . metFORMIN (GLUCOPHAGE) 1000 MG tablet Take 1,000 mg by mouth 2 (two) times daily.  . metoprolol succinate (TOPROL-XL) 100 MG 24 hr tablet TAKE 1 TABLET BY MOUTH ONCE DAILY WITH OR IMMEDIATELY FOLLOWING A MEAL  . Multiple Vitamin (  MULTI-VITAMINS) TABS Take 1 tablet by mouth daily.   . nitroGLYCERIN (NITROSTAT) 0.4 MG SL tablet Place 1 tablet (0.4 mg total) under the tongue every 5 (five) minutes as needed.   No facility-administered encounter medications on file as of 05/16/2019.     Allergies: Allergies  Allergen Reactions  . Bee Venom     Family History: Family History  Problem Relation Age of Onset  . Cancer Sister        Bone  .  Diabetes Mother   . High blood pressure Mother   . Heart disease Mother 11  . Heart attack Brother   . Diabetes Brother   . High blood pressure Brother   . Heart disease Brother   . Other Father 17       MVA  . Heart disease Sister   . Diabetes Sister     Social History: Social History   Socioeconomic History  . Marital status: Married    Spouse name: [name]  . Number of children: 2  . Years of education: Not on file  . Highest education level: Associate degree: occupational, Hotel manager, or vocational program  Occupational History  . Occupation: retired  Scientific laboratory technician  . Financial resource strain: Not on file  . Food insecurity:    Worry: Not on file    Inability: Not on file  . Transportation needs:    Medical: Not on file    Non-medical: Not on file  Tobacco Use  . Smoking status: Former Smoker    Last attempt to quit: 02/25/1996    Years since quitting: 23.2  . Smokeless tobacco: Former Network engineer and Sexual Activity  . Alcohol use: No  . Drug use: No  . Sexual activity: Not on file  Lifestyle  . Physical activity:    Days per week: Not on file    Minutes per session: Not on file  . Stress: Not on file  Relationships  . Social connections:    Talks on phone: Not on file    Gets together: Not on file    Attends religious service: Not on file    Active member of club or organization: Not on file    Attends meetings of clubs or organizations: Not on file    Relationship status: Not on file  . Intimate partner violence:    Fear of current or ex partner: Not on file    Emotionally abused: Not on file    Physically abused: Not on file    Forced sexual activity: Not on file  Other Topics Concern  . Not on file  Social History Narrative   Patient is left-handed. He lives with his wife in a one level home. He drinks 2 cups of coffee a day and an occasional diet soda. He does not regularly exercise.   Observations/Objective:   Height 6\' 1"  (1.854 m), weight  260 lb (117.9 kg). No acute distress.  Alert and oriented.  Speech fluent and not dysarthric.  Language intact.  Face symmetric.    Assessment and Plan:   1.  Transient ischemic attack 2.  Atrial fibrillation 3.  Type 2 diabetes mellitus 4.  Hypertension 5.  Hyperlipidemia 6.  Coronary artery disease  1.  ASA 81mg  daily and Eliquis for secondary stroke prevention. 2.  Lipitor 80mg  daily (LDL goal less than 70) 3.  Optimize glycemic control 4.  Blood pressure control 5.  Mediterranean diet 6.  Encouraged him to continue walking 7.  Follow up  in 6 months.  Follow Up Instructions:    -I discussed the assessment and treatment plan with the patient. The patient was provided an opportunity to ask questions and all were answered. The patient agreed with the plan and demonstrated an understanding of the instructions.   The patient was advised to call back or seek an in-person evaluation if the symptoms worsen or if the condition fails to improve as anticipated.    Total Time spent in visit with the patient was:  15 minutes   Dudley Major, DO

## 2019-05-14 ENCOUNTER — Other Ambulatory Visit: Payer: Self-pay | Admitting: Cardiology

## 2019-05-16 ENCOUNTER — Encounter: Payer: Self-pay | Admitting: Neurology

## 2019-05-16 ENCOUNTER — Telehealth (INDEPENDENT_AMBULATORY_CARE_PROVIDER_SITE_OTHER): Payer: PPO | Admitting: Neurology

## 2019-05-16 ENCOUNTER — Other Ambulatory Visit: Payer: Self-pay

## 2019-05-16 VITALS — Ht 73.0 in | Wt 260.0 lb

## 2019-05-16 DIAGNOSIS — G459 Transient cerebral ischemic attack, unspecified: Secondary | ICD-10-CM | POA: Diagnosis not present

## 2019-05-16 DIAGNOSIS — E785 Hyperlipidemia, unspecified: Secondary | ICD-10-CM

## 2019-05-16 DIAGNOSIS — I10 Essential (primary) hypertension: Secondary | ICD-10-CM

## 2019-05-16 DIAGNOSIS — E119 Type 2 diabetes mellitus without complications: Secondary | ICD-10-CM

## 2019-05-16 DIAGNOSIS — I2510 Atherosclerotic heart disease of native coronary artery without angina pectoris: Secondary | ICD-10-CM

## 2019-05-16 DIAGNOSIS — I482 Chronic atrial fibrillation, unspecified: Secondary | ICD-10-CM

## 2019-05-16 NOTE — Patient Instructions (Signed)
Mediterranean Diet  A Mediterranean diet refers to food and lifestyle choices that are based on the traditions of countries located on the [place] Sea. This way of eating has been shown to help prevent certain conditions and improve outcomes for people who have chronic diseases, like kidney disease and heart disease.  What are tips for following this plan?  Lifestyle   Cook and eat meals together with your family, when possible.   Drink enough fluid to keep your urine clear or pale yellow.   Be physically active every day. This includes:  ? Aerobic exercise like running or swimming.  ? Leisure activities like gardening, walking, or housework.   Get 7-8 hours of sleep each night.   If recommended by your health care provider, drink red wine in moderation. This means 1 glass a day for nonpregnant women and 2 glasses a day for men. A glass of wine equals 5 oz (150 mL).  Reading food labels     Check the serving size of packaged foods. For foods such as rice and pasta, the serving size refers to the amount of cooked product, not dry.   Check the total fat in packaged foods. Avoid foods that have saturated fat or trans fats.   Check the ingredients list for added sugars, such as corn syrup.  Shopping   At the grocery store, buy most of your food from the areas near the walls of the store. This includes:  ? Fresh fruits and vegetables (produce).  ? Grains, beans, nuts, and seeds. Some of these may be available in unpackaged forms or large amounts (in bulk).  ? Fresh seafood.  ? Poultry and eggs.  ? Low-fat dairy products.   Buy whole ingredients instead of prepackaged foods.   Buy fresh fruits and vegetables in-season from local farmers markets.   Buy frozen fruits and vegetables in resealable bags.   If you do not have access to quality fresh seafood, buy precooked frozen shrimp or canned fish, such as tuna, salmon, or sardines.   Buy small amounts of raw or cooked vegetables, salads, or olives from  the deli or salad bar at your store.   Stock your pantry so you always have certain foods on hand, such as olive oil, canned tuna, canned tomatoes, rice, pasta, and beans.  Cooking   Cook foods with extra-virgin olive oil instead of using butter or other vegetable oils.   Have meat as a side dish, and have vegetables or grains as your main dish. This means having meat in small portions or adding small amounts of meat to foods like pasta or stew.   Use beans or vegetables instead of meat in common dishes like chili or lasagna.   Experiment with different cooking methods. Try roasting or broiling vegetables instead of steaming or sauteing them.   Add frozen vegetables to soups, stews, pasta, or rice.   Add nuts or seeds for added healthy fat at each meal. You can add these to yogurt, salads, or vegetable dishes.   Marinate fish or vegetables using olive oil, lemon juice, garlic, and fresh herbs.  Meal planning     Plan to eat 1 vegetarian meal one day each week. Try to work up to 2 vegetarian meals, if possible.   Eat seafood 2 or more times a week.   Have healthy snacks readily available, such as:  ? Vegetable sticks with hummus.  ? Greek yogurt.  ? Fruit and nut trail mix.   Eat balanced   meals throughout the week. This includes:  ? Fruit: 2-3 servings a day  ? Vegetables: 4-5 servings a day  ? Low-fat dairy: 2 servings a day  ? Fish, poultry, or lean meat: 1 serving a day  ? Beans and legumes: 2 or more servings a week  ? Nuts and seeds: 1-2 servings a day  ? Whole grains: 6-8 servings a day  ? Extra-virgin olive oil: 3-4 servings a day   Limit red meat and sweets to only a few servings a month  What are my food choices?   Mediterranean diet  ? Recommended  ? Grains: Whole-grain pasta. Brown rice. Bulgar wheat. Polenta. Couscous. Whole-wheat bread. Oatmeal. Quinoa.  ? Vegetables: Artichokes. Beets. Broccoli. Cabbage. Carrots. Eggplant. Green beans. Chard. Kale. Spinach. Onions. Leeks. Peas. Squash.  Tomatoes. Peppers. Radishes.  ? Fruits: Apples. Apricots. Avocado. Berries. Bananas. Cherries. Dates. Figs. Grapes. Lemons. Melon. Oranges. Peaches. Plums. Pomegranate.  ? Meats and other protein foods: Beans. Almonds. Sunflower seeds. Pine nuts. Peanuts. Cod. Salmon. Scallops. Shrimp. Tuna. Tilapia. Clams. Oysters. Eggs.  ? Dairy: Low-fat milk. Cheese. Greek yogurt.  ? Beverages: Water. Red wine. Herbal tea.  ? Fats and oils: Extra virgin olive oil. Avocado oil. Grape seed oil.  ? Sweets and desserts: Greek yogurt with honey. Baked apples. Poached pears. Trail mix.  ? Seasoning and other foods: Basil. Cilantro. Coriander. Cumin. Mint. Parsley. Sage. Rosemary. Tarragon. Garlic. Oregano. Thyme. Pepper. Balsalmic vinegar. Tahini. Hummus. Tomato sauce. Olives. Mushrooms.  ? Limit these  ? Grains: Prepackaged pasta or rice dishes. Prepackaged cereal with added sugar.  ? Vegetables: Deep fried potatoes (french fries).  ? Fruits: Fruit canned in syrup.  ? Meats and other protein foods: Beef. Pork. Lamb. Poultry with skin. Hot dogs. Bacon.  ? Dairy: Ice cream. Sour cream. Whole milk.  ? Beverages: Juice. Sugar-sweetened soft drinks. Beer. Liquor and spirits.  ? Fats and oils: Butter. Canola oil. Vegetable oil. Beef fat (tallow). Lard.  ? Sweets and desserts: Cookies. Cakes. Pies. Candy.  ? Seasoning and other foods: Mayonnaise. Premade sauces and marinades.  ? The items listed may not be a complete list. Talk with your dietitian about what dietary choices are right for you.  Summary   The Mediterranean diet includes both food and lifestyle choices.   Eat a variety of fresh fruits and vegetables, beans, nuts, seeds, and whole grains.   Limit the amount of red meat and sweets that you eat.   Talk with your health care provider about whether it is safe for you to drink red wine in moderation. This means 1 glass a day for nonpregnant women and 2 glasses a day for men. A glass of wine equals 5 oz (150 mL).  This information  is not intended to replace advice given to you by your health care provider. Make sure you discuss any questions you have with your health care provider.  Document Released: 07/24/2016 Document Revised: 08/26/2016 Document Reviewed: 07/24/2016  Elsevier Interactive Patient Education  2019 Elsevier Inc.

## 2019-05-17 ENCOUNTER — Ambulatory Visit: Payer: PPO | Admitting: Neurology

## 2019-05-25 ENCOUNTER — Other Ambulatory Visit: Payer: Self-pay | Admitting: Cardiology

## 2019-06-15 DIAGNOSIS — E782 Mixed hyperlipidemia: Secondary | ICD-10-CM | POA: Diagnosis not present

## 2019-06-15 DIAGNOSIS — E119 Type 2 diabetes mellitus without complications: Secondary | ICD-10-CM | POA: Diagnosis not present

## 2019-06-24 ENCOUNTER — Other Ambulatory Visit: Payer: Self-pay | Admitting: Cardiology

## 2019-07-20 DIAGNOSIS — H524 Presbyopia: Secondary | ICD-10-CM | POA: Diagnosis not present

## 2019-07-20 DIAGNOSIS — H5203 Hypermetropia, bilateral: Secondary | ICD-10-CM | POA: Diagnosis not present

## 2019-07-20 DIAGNOSIS — E113393 Type 2 diabetes mellitus with moderate nonproliferative diabetic retinopathy without macular edema, bilateral: Secondary | ICD-10-CM | POA: Diagnosis not present

## 2019-08-16 ENCOUNTER — Other Ambulatory Visit: Payer: Self-pay | Admitting: Cardiology

## 2019-08-25 ENCOUNTER — Telehealth: Payer: PPO | Admitting: Cardiology

## 2019-08-29 ENCOUNTER — Other Ambulatory Visit: Payer: Self-pay

## 2019-08-29 ENCOUNTER — Telehealth (INDEPENDENT_AMBULATORY_CARE_PROVIDER_SITE_OTHER): Payer: PPO | Admitting: Cardiology

## 2019-08-29 ENCOUNTER — Encounter: Payer: Self-pay | Admitting: Cardiology

## 2019-08-29 VITALS — BP 148/81 | HR 72

## 2019-08-29 DIAGNOSIS — I10 Essential (primary) hypertension: Secondary | ICD-10-CM

## 2019-08-29 DIAGNOSIS — E785 Hyperlipidemia, unspecified: Secondary | ICD-10-CM

## 2019-08-29 DIAGNOSIS — I2510 Atherosclerotic heart disease of native coronary artery without angina pectoris: Secondary | ICD-10-CM

## 2019-08-29 DIAGNOSIS — I482 Chronic atrial fibrillation, unspecified: Secondary | ICD-10-CM

## 2019-08-29 DIAGNOSIS — G459 Transient cerebral ischemic attack, unspecified: Secondary | ICD-10-CM

## 2019-08-29 DIAGNOSIS — E119 Type 2 diabetes mellitus without complications: Secondary | ICD-10-CM

## 2019-08-29 NOTE — Progress Notes (Signed)
Virtual Visit via Video Note   This visit type was conducted due to national recommendations for restrictions regarding the COVID-19 Pandemic (e.g. social distancing) in an effort to limit this patient's exposure and mitigate transmission in our community.  Due to his co-morbid illnesses, this patient is at least at moderate risk for complications without adequate follow up.  This format is felt to be most appropriate for this patient at this time.  All issues noted in this document were discussed and addressed.  A limited physical exam was performed with this format.  Please refer to the patient's chart for his consent to telehealth for [facility].  Evaluation Performed:  Follow-up visit  This visit type was conducted due to national recommendations for restrictions regarding the COVID-19 Pandemic (e.g. social distancing).  This format is felt to be most appropriate for this patient at this time.  All issues noted in this document were discussed and addressed.  No physical exam was performed (except for noted visual exam findings with Video Visits).  Please refer to the patient's chart (MyChart message for video visits and phone note for telephone visits) for the patient's consent to telehealth for [facility]  Date:  08/29/2019  ID: Robert Spears, Robert Spears 03/22/1951, MRN [id number]   Patient Location: [place] [id number]   Provider location:   [place] Office  PCP:  Algis Greenhouse, MD  Cardiologist:  Jenne Campus, MD     Chief Complaint: Doing well  History of Present Illness:    Robert Spears is a 68 y.o. male  who presents via audio/video conferencing for a telehealth visit today.  With past medical history significant for coronary artery disease, status post coronary artery bypass graft, atrial fibrillation which is permanent, history of diabetes.  Does have a video visit with me today overall seems to be doing well denies have any  chest pain, tightness, pressure, burning in the chest.  He is very busy working in his backyard.  He complains of having some swelling of lower extremities especially evening time he did stop 1 of amlodipine he takes only 5 mg swelling disappeared.  His blood pressures a little bit elevated but the only time he check his blood pressures are in the morning.  Asking for next week to check blood pressure at different times of the day and see what his blood pressure behaves like during the day.  We may be forced to add some additional medication to help with his blood pressure.   The patient does not have symptoms concerning for COVID-19 infection (fever, chills, cough, or new SHORTNESS OF BREATH).    Prior CV studies:   The following studies were reviewed today:       Past Medical History:  Diagnosis Date   A-fib (Robert Spears)    Allergy to bee sting    Diabetes mellitus    High blood pressure    High cholesterol    Stroke (Robert Spears)    Weight loss     Past Surgical History:  Procedure Laterality Date   ARTERIAL BYPASS SURGRY     CARDIAC SURGERY     CORONARY ARTERY BYPASS GRAFT     CORONARY STENT PLACEMENT       Current Meds  Medication Sig   amLODipine (NORVASC) 5 MG tablet Take 2 tablets by mouth once daily (Patient taking differently: Take 5 mg by mouth daily. )   aspirin EC 81 MG tablet Take 81  mg by mouth daily.    atorvastatin (LIPITOR) 80 MG tablet Take 80 mg by mouth daily.    ELIQUIS 5 MG TABS tablet Take 1 tablet by mouth twice daily   EPINEPHrine (EPIPEN 2-PAK) 0.3 mg/0.3 mL IJ SOAJ injection Use as directed for life-threatening allergic reaction.   ezetimibe (ZETIA) 10 MG tablet Take 1 tablet (10 mg total) by mouth daily.   glimepiride (AMARYL) 2 MG tablet Take 2 mg by mouth daily.    losartan (COZAAR) 100 MG tablet Take 1 tablet by mouth once daily   metFORMIN (GLUCOPHAGE) 1000 MG tablet Take 1,000 mg by mouth 2 (two) times daily.   metoprolol succinate  (TOPROL-XL) 100 MG 24 hr tablet TAKE 1 TABLET BY MOUTH ONCE DAILY WITH MEALS OR IMMEDIATELY FOLLOWING MEALS   Multiple Vitamin (MULTI-VITAMINS) TABS Take 1 tablet by mouth daily.    nitroGLYCERIN (NITROSTAT) 0.4 MG SL tablet Place 1 tablet (0.4 mg total) under the tongue every 5 (five) minutes as needed.      Family History: The patient's family history includes Cancer in his sister; Diabetes in his brother, mother, and sister; Heart attack in his brother; Heart disease in his brother and sister; Heart disease (age of onset: 88) in his mother; High blood pressure in his brother and mother; Other (age of onset: 79) in his father.   ROS:   Please see the history of present illness.     All other systems reviewed and are negative.   Labs/Other Tests and Data Reviewed:     Recent Labs: 11/01/2018: BUN 15; Creatinine, Ser 1.40; Potassium 5.0; Sodium 140  Recent Lipid Panel No results found for: CHOL, TRIG, HDL, CHOLHDL, VLDL, LDLCALC, LDLDIRECT    Exam:    Vital Signs:  BP (!) 148/81    Pulse 72     Wt Readings from Last 3 Encounters:  05/16/19 260 lb (117.9 kg)  04/27/19 260 lb (117.9 kg)  01/27/19 261 lb 9.6 oz (118.7 kg)     Well nourished, well developed in no acute distress. He talks to me over the video link from his living room.  I am in our office in [facility].  He is asymptomatic and in good spirits not in any distress  Diagnosis for this visit:   1. Coronary artery disease involving native coronary artery of native heart without angina pectoris   2. Essential hypertension   3. Chronic atrial fibrillation   4. TIA (transient ischemic attack)   5. Type 2 diabetes mellitus without complication, without long-term current use of insulin (Robert Spears)   6. Dyslipidemia      ASSESSMENT & PLAN:    1.  Coronary disease stable on appropriate medication which I will continue. 2.  Essential hypertension plan as outlined above he will check his blood pressure for next week  then we contact him to find out if anything else needs to be done about his blood pressure.  He had to cut down his amlodipine from 10-5 because of swelling of lower extremities. 3.  Permanent atrial fibrillation anticoagulated rate controlled continue present management. 4.  History of TIA no more. 5.  Type 2 diabetes doing well from that point reviewed his sugars between 90 and 150. 6.  Dyslipidemia continue with statin.  COVID-19 Education: The signs and symptoms of COVID-19 were discussed with the patient and how to seek care for testing (follow up with PCP or arrange E-visit).  The importance of social distancing was discussed today.  Patient Risk:  After full review of this patients clinical status, I feel that they are at least moderate risk at this time.  Time:   Today, I have spent 18 minutes with the patient with telehealth technology discussing pt health issues.  I spent 5 minutes reviewing her chart before the visit.  Visit was finished at 1011.    Medication Adjustments/Labs and Tests Ordered: Current medicines are reviewed at length with the patient today.  Concerns regarding medicines are outlined above.  No orders of the defined types were placed in this encounter.  Medication changes: No orders of the defined types were placed in this encounter.    Disposition: Follow-up 3 months  Signed, Park Liter, MD, [facility] 08/29/2019 10:09 AM    [place]

## 2019-08-29 NOTE — Patient Instructions (Addendum)
Medication Instructions:  Your physician recommends that you continue on your current medications as directed. Please refer to the Current Medication list given to you today.  If you need a refill on your cardiac medications before your next appointment, please call your pharmacy.   Lab work: None If you have labs (blood work) drawn today and your tests are completely normal, you will receive your results only by: Marland Kitchen MyChart Message (if you have MyChart) OR . A paper copy in the mail If you have any lab test that is abnormal or we need to change your treatment, we will call you to review the results.  Testing/Procedures: NOne  Follow-Up: At [facility], you and your health needs are our priority.  As part of our continuing mission to provide you with exceptional heart care, we have created designated Provider Care Teams.  These Care Teams include your primary Cardiologist (physician) and Advanced Practice Providers (APPs -  Physician Assistants and Nurse Practitioners) who all work together to provide you with the care you need, when you need it. You will need a follow up appointment in 3 months with Dr Agustin Cree. Any Other Special Instructions Will Be Listed Below (If Applicable).

## 2019-09-07 ENCOUNTER — Telehealth: Payer: Self-pay | Admitting: Cardiology

## 2019-09-07 DIAGNOSIS — I482 Chronic atrial fibrillation, unspecified: Secondary | ICD-10-CM

## 2019-09-07 DIAGNOSIS — I10 Essential (primary) hypertension: Secondary | ICD-10-CM

## 2019-09-07 DIAGNOSIS — I2510 Atherosclerotic heart disease of native coronary artery without angina pectoris: Secondary | ICD-10-CM

## 2019-09-07 DIAGNOSIS — E119 Type 2 diabetes mellitus without complications: Secondary | ICD-10-CM

## 2019-09-07 NOTE — Telephone Encounter (Signed)
Wants to go over his BP readings

## 2019-09-07 NOTE — Addendum Note (Signed)
Addended by: Austin Miles on: 09/07/2019 12:00 PM   Modules accepted: Orders

## 2019-09-07 NOTE — Telephone Encounter (Signed)
Check chem7 so we can decide what we need to add to get bp better

## 2019-09-07 NOTE — Telephone Encounter (Signed)
Called patient to obtain his blood pressure log:   9/15: 152/92 AM & 160/85 PM 9/16: 159/81 AM & 144/81 PM 9/17: 147/90 AM & 151/85 PM 9/18: 154/82 AM & 157/87 PM 9/19: 160/84 AM & 150/82 PM 9/20: 146/82 AM & 160/92 PM 9/21: 150/87 AM & 162/82 PM 9/22: 169/91 AM & 134/82 PM 9/23: 161/90 AM   Patient continues to take amlodipine 5 mg daily in addition to all his other medications as listed. Please review and advise of any further medications changes. Thanks!

## 2019-09-07 NOTE — Telephone Encounter (Signed)
Left message on patient's home phone per DPR advising patient to come into the office for lab work, no appointment needed. No need to fast beforehand. Advised patient that medication changes would be made by Dr. Agustin Cree once lab results are reviewed. Informed him to contact our office with any further questions or concerns.

## 2019-09-08 DIAGNOSIS — I2510 Atherosclerotic heart disease of native coronary artery without angina pectoris: Secondary | ICD-10-CM | POA: Diagnosis not present

## 2019-09-08 DIAGNOSIS — I10 Essential (primary) hypertension: Secondary | ICD-10-CM | POA: Diagnosis not present

## 2019-09-08 DIAGNOSIS — E119 Type 2 diabetes mellitus without complications: Secondary | ICD-10-CM | POA: Diagnosis not present

## 2019-09-08 DIAGNOSIS — I482 Chronic atrial fibrillation, unspecified: Secondary | ICD-10-CM | POA: Diagnosis not present

## 2019-09-08 LAB — BASIC METABOLIC PANEL
BUN/Creatinine Ratio: 10 (ref 10–24)
BUN: 15 mg/dL (ref 8–27)
CO2: 21 mmol/L (ref 20–29)
Calcium: 9.5 mg/dL (ref 8.6–10.2)
Chloride: 101 mmol/L (ref 96–106)
Creatinine, Ser: 1.56 mg/dL — ABNORMAL HIGH (ref 0.76–1.27)
GFR calc Af Amer: 52 mL/min/{1.73_m2} — ABNORMAL LOW (ref >59)
GFR calc non Af Amer: 45 mL/min/{1.73_m2} — ABNORMAL LOW (ref >59)
Glucose: 150 mg/dL — ABNORMAL HIGH (ref 65–99)
Potassium: 4.6 mmol/L (ref 3.5–5.2)
Sodium: 136 mmol/L (ref 134–144)

## 2019-09-27 DIAGNOSIS — I10 Essential (primary) hypertension: Secondary | ICD-10-CM | POA: Diagnosis not present

## 2019-09-27 DIAGNOSIS — I129 Hypertensive chronic kidney disease with stage 1 through stage 4 chronic kidney disease, or unspecified chronic kidney disease: Secondary | ICD-10-CM | POA: Diagnosis not present

## 2019-09-27 DIAGNOSIS — Z125 Encounter for screening for malignant neoplasm of prostate: Secondary | ICD-10-CM | POA: Diagnosis not present

## 2019-09-27 DIAGNOSIS — E119 Type 2 diabetes mellitus without complications: Secondary | ICD-10-CM | POA: Diagnosis not present

## 2019-09-27 DIAGNOSIS — Z0000 Encounter for general adult medical examination without abnormal findings: Secondary | ICD-10-CM | POA: Diagnosis not present

## 2019-09-27 DIAGNOSIS — E782 Mixed hyperlipidemia: Secondary | ICD-10-CM | POA: Diagnosis not present

## 2019-09-27 DIAGNOSIS — N183 Chronic kidney disease, stage 3 unspecified: Secondary | ICD-10-CM | POA: Diagnosis not present

## 2019-10-18 ENCOUNTER — Other Ambulatory Visit: Payer: Self-pay | Admitting: Cardiology

## 2019-11-11 ENCOUNTER — Other Ambulatory Visit: Payer: Self-pay | Admitting: Cardiology

## 2019-11-14 NOTE — Telephone Encounter (Signed)
Losartan and Metoprolol refills sent to [facility] in [place].

## 2019-11-17 ENCOUNTER — Ambulatory Visit: Payer: PPO | Admitting: Neurology

## 2019-12-01 ENCOUNTER — Other Ambulatory Visit: Payer: Self-pay

## 2019-12-01 ENCOUNTER — Ambulatory Visit (INDEPENDENT_AMBULATORY_CARE_PROVIDER_SITE_OTHER): Payer: PPO | Admitting: Cardiology

## 2019-12-01 VITALS — BP 160/80 | HR 98 | Ht 73.0 in | Wt 257.6 lb

## 2019-12-01 DIAGNOSIS — I2510 Atherosclerotic heart disease of native coronary artery without angina pectoris: Secondary | ICD-10-CM | POA: Diagnosis not present

## 2019-12-01 DIAGNOSIS — I10 Essential (primary) hypertension: Secondary | ICD-10-CM | POA: Diagnosis not present

## 2019-12-01 DIAGNOSIS — E785 Hyperlipidemia, unspecified: Secondary | ICD-10-CM | POA: Diagnosis not present

## 2019-12-01 DIAGNOSIS — I482 Chronic atrial fibrillation, unspecified: Secondary | ICD-10-CM

## 2019-12-01 NOTE — Progress Notes (Signed)
Cardiology Office Note:    Date:  12/01/2019   ID:  Robert Spears, DOB April 06, 1951, MRN [id number]  PCP:  Algis Greenhouse, MD  Cardiologist:  Jenne Campus, MD    Referring MD: Algis Greenhouse, MD   Chief Complaint  Patient presents with  . Follow-up  Doing very well  History of Present Illness:    Robert Spears is a 68 y.o. male with essential hypertension, chronic atrial fibrillation, coronary artery disease.  Comes today to my office for follow-up.  Overall doing well.  Denies have any chest pain tightness squeezing pressure burning chest.  2 issues that are arising #1 he still gets some swelling of lower extremities, his blood pressure also is elevated.  Overall he otherwise he is doing well.  Past Medical History:  Diagnosis Date  . A-fib ([place])   . Allergy to bee sting   . Diabetes mellitus   . High blood pressure   . High cholesterol   . Stroke ([place])   . Weight loss     Past Surgical History:  Procedure Laterality Date  . ARTERIAL BYPASS SURGRY    . CARDIAC SURGERY    . CORONARY ARTERY BYPASS GRAFT    . CORONARY STENT PLACEMENT      Current Medications: Current Meds  Medication Sig  . amLODipine (NORVASC) 5 MG tablet Take 2 tablets by mouth once daily (Patient taking differently: Take 5 mg by mouth daily. )  . aspirin EC 81 MG tablet Take 81 mg by mouth daily.   Marland Kitchen atorvastatin (LIPITOR) 80 MG tablet Take 80 mg by mouth daily.   Marland Kitchen ELIQUIS 5 MG TABS tablet Take 1 tablet by mouth twice daily  . EPINEPHrine (EPIPEN 2-PAK) 0.3 mg/0.3 mL IJ SOAJ injection Use as directed for life-threatening allergic reaction.  Marland Kitchen ezetimibe (ZETIA) 10 MG tablet Take 1 tablet (10 mg total) by mouth daily.  Marland Kitchen glimepiride (AMARYL) 2 MG tablet Take 2 mg by mouth daily.   Marland Kitchen losartan (COZAAR) 100 MG tablet Take 1 tablet by mouth once daily  . metFORMIN (GLUCOPHAGE) 1000 MG tablet Take 1,000 mg by mouth 2 (two) times daily.  . metoprolol succinate (TOPROL-XL) 100 MG 24 hr tablet  TAKE 1 TABLET BY MOUTH ONCE DAILY WITH MEALS OR IMMEDIATELY FOLLOWING MEALS  . Multiple Vitamin (MULTI-VITAMINS) TABS Take 1 tablet by mouth daily.   . nitroGLYCERIN (NITROSTAT) 0.4 MG SL tablet Place 1 tablet (0.4 mg total) under the tongue every 5 (five) minutes as needed.     Allergies:   Bee venom   Social History   Socioeconomic History  . Marital status: Married    Spouse name: [name]  . Number of children: 2  . Years of education: Not on file  . Highest education level: Associate degree: occupational, Hotel manager, or vocational program  Occupational History  . Occupation: retired  Tobacco Use  . Smoking status: Former Smoker    Quit date: 02/25/1996    Years since quitting: 23.7  . Smokeless tobacco: Former Network engineer and Sexual Activity  . Alcohol use: No  . Drug use: No  . Sexual activity: Not on file  Other Topics Concern  . Not on file  Social History Narrative   Patient is left-handed. He lives with his wife in a one level home. He drinks 2 cups of coffee a day and an occasional diet soda. He does not regularly exercise.   Social Determinants of Health   Financial Resource Strain:   .  Difficulty of Paying Living Expenses: Not on file  Food Insecurity:   . Worried About Charity fundraiser in the Last Year: Not on file  . Ran Out of Food in the Last Year: Not on file  Transportation Needs:   . Lack of Transportation (Medical): Not on file  . Lack of Transportation (Non-Medical): Not on file  Physical Activity:   . Days of Exercise per Week: Not on file  . Minutes of Exercise per Session: Not on file  Stress:   . Feeling of Stress : Not on file  Social Connections:   . Frequency of Communication with Friends and Family: Not on file  . Frequency of Social Gatherings with Friends and Family: Not on file  . Attends Religious Services: Not on file  . Active Member of Clubs or Organizations: Not on file  . Attends Archivist Meetings: Not on file  .  Marital Status: Not on file     Family History: The patient's family history includes Cancer in his sister; Diabetes in his brother, mother, and sister; Heart attack in his brother; Heart disease in his brother and sister; Heart disease (age of onset: 23) in his mother; High blood pressure in his brother and mother; Other (age of onset: 60) in his father. ROS:   Please see the history of present illness.    All 14 point review of systems negative except as described per history of present illness  EKGs/Labs/Other Studies Reviewed:      Recent Labs: 09/08/2019: BUN 15; Creatinine, Ser 1.56; Potassium 4.6; Sodium 136  Recent Lipid Panel No results found for: CHOL, TRIG, HDL, CHOLHDL, VLDL, LDLCALC, LDLDIRECT  Physical Exam:    VS:  BP (!) 160/80   Pulse 98   Ht 6\' 1"  (1.854 m)   Wt 257 lb 9.6 oz (116.8 kg)   SpO2 96%   BMI 33.99 kg/m     Wt Readings from Last 3 Encounters:  12/01/19 257 lb 9.6 oz (116.8 kg)  05/16/19 260 lb (117.9 kg)  04/27/19 260 lb (117.9 kg)     GEN:  Well nourished, well developed in no acute distress HEENT: Normal NECK: No JVD; No carotid bruits LYMPHATICS: No lymphadenopathy CARDIAC: RRR, no murmurs, no rubs, no gallops RESPIRATORY:  Clear to auscultation without rales, wheezing or rhonchi  ABDOMEN: Soft, non-tender, non-distended MUSCULOSKELETAL:  No edema; No deformity  SKIN: Warm and dry LOWER EXTREMITIES: no swelling NEUROLOGIC:  Alert and oriented x 3 PSYCHIATRIC:  Normal affect   ASSESSMENT:    1. Coronary artery disease involving native coronary artery of native heart without angina pectoris   2. Hypertension, unspecified type   3. Chronic atrial fibrillation ([place])   4. Dyslipidemia    PLAN:    In order of problems listed above:  1. Coronary disease stable from that point review we will continue present management. 2. Essential hypertension blood pressure still not well controlled I will check Chem-7 today if Chem-7 is fine will  initiate diuretic. 3. Permanent atrial fibrillation.  Anticoagulated which I will continue.  Rate control he is doing well from that point review I will ask him to have echocardiogram to assess left ventricle ejection fraction as well as left atrial size. 4. Dyslipidemia will call primary care physician to get his fasting lipid profile.   Medication Adjustments/Labs and Tests Ordered: Current medicines are reviewed at length with the patient today.  Concerns regarding medicines are outlined above.  Orders Placed This Encounter  Procedures  . Basic Metabolic Panel (BMET)  . ECHOCARDIOGRAM COMPLETE   Medication changes: No orders of the defined types were placed in this encounter.   Signed, Park Liter, MD, [facility] 12/01/2019 9:19 AM    [place]

## 2019-12-01 NOTE — Patient Instructions (Signed)
Medication Instructions:  Your physician recommends that you continue on your current medications as directed. Please refer to the Current Medication list given to you today.  *If you need a refill on your cardiac medications before your next appointment, please call your pharmacy*  Lab Work: Your physician recommends that you return for lab work today: bmp    If you have labs (blood work) drawn today and your tests are completely normal, you will receive your results only by: Marland Kitchen MyChart Message (if you have MyChart) OR . A paper copy in the mail If you have any lab test that is abnormal or we need to change your treatment, we will call you to review the results.  Testing/Procedures: Your physician has requested that you have an echocardiogram. Echocardiography is a painless test that uses sound waves to create images of your heart. It provides your doctor with information about the size and shape of your heart and how well your heart's chambers and valves are working. This procedure takes approximately one hour. There are no restrictions for this procedure.    Follow-Up: At [facility], you and your health needs are our priority.  As part of our continuing mission to provide you with exceptional heart care, we have created designated Provider Care Teams.  These Care Teams include your primary Cardiologist (physician) and Advanced Practice Providers (APPs -  Physician Assistants and Nurse Practitioners) who all work together to provide you with the care you need, when you need it.  Your next appointment:   4 month(s)  The format for your next appointment:   In Person  Provider:   Jenne Campus, MD  Other Instructions   Echocardiogram An echocardiogram is a procedure that uses painless sound waves (ultrasound) to produce an image of the heart. Images from an echocardiogram can provide important information about:  Signs of coronary artery disease (CAD).  Aneurysm detection.  An aneurysm is a weak or damaged part of an artery wall that bulges out from the normal force of blood pumping through the body.  Heart size and shape. Changes in the size or shape of the heart can be associated with certain conditions, including heart failure, aneurysm, and CAD.  Heart muscle function.  Heart valve function.  Signs of a past heart attack.  Fluid buildup around the heart.  Thickening of the heart muscle.  A tumor or infectious growth around the heart valves. Tell a health care provider about:  Any allergies you have.  All medicines you are taking, including vitamins, herbs, eye drops, creams, and over-the-counter medicines.  Any blood disorders you have.  Any surgeries you have had.  Any medical conditions you have.  Whether you are pregnant or may be pregnant. What are the risks? Generally, this is a safe procedure. However, problems may occur, including:  Allergic reaction to dye (contrast) that may be used during the procedure. What happens before the procedure? No specific preparation is needed. You may eat and drink normally. What happens during the procedure?   An IV tube may be inserted into one of your veins.  You may receive contrast through this tube. A contrast is an injection that improves the quality of the pictures from your heart.  A gel will be applied to your chest.  A wand-like tool (transducer) will be moved over your chest. The gel will help to transmit the sound waves from the transducer.  The sound waves will harmlessly bounce off of your heart to allow the heart  images to be captured in real-time motion. The images will be recorded on a computer. The procedure may vary among health care providers and hospitals. What happens after the procedure?  You may return to your normal, everyday life, including diet, activities, and medicines, unless your health care provider tells you not to do that. Summary  An echocardiogram is a  procedure that uses painless sound waves (ultrasound) to produce an image of the heart.  Images from an echocardiogram can provide important information about the size and shape of your heart, heart muscle function, heart valve function, and fluid buildup around your heart.  You do not need to do anything to prepare before this procedure. You may eat and drink normally.  After the echocardiogram is completed, you may return to your normal, everyday life, unless your health care provider tells you not to do that. This information is not intended to replace advice given to you by your health care provider. Make sure you discuss any questions you have with your health care provider. Document Released: 11/28/2000 Document Revised: 03/24/2019 Document Reviewed: 01/03/2017 Elsevier Patient Education  2020 [employer].

## 2019-12-02 LAB — BASIC METABOLIC PANEL WITH GFR
BUN/Creatinine Ratio: 13 (ref 10–24)
BUN: 16 mg/dL (ref 8–27)
CO2: 21 mmol/L (ref 20–29)
Calcium: 8.8 mg/dL (ref 8.6–10.2)
Chloride: 104 mmol/L (ref 96–106)
Creatinine, Ser: 1.19 mg/dL (ref 0.76–1.27)
GFR calc Af Amer: 72 mL/min/1.73 (ref >59)
GFR calc non Af Amer: 62 mL/min/1.73 (ref >59)
Glucose: 164 mg/dL — ABNORMAL HIGH (ref 65–99)
Potassium: 4.5 mmol/L (ref 3.5–5.2)
Sodium: 139 mmol/L (ref 134–144)

## 2019-12-05 ENCOUNTER — Telehealth: Payer: Self-pay | Admitting: Emergency Medicine

## 2019-12-05 MED ORDER — FUROSEMIDE 20 MG PO TABS: 20.0000 mg | ORAL_TABLET | Freq: Every day | ORAL | 1 refills | Status: DC

## 2019-12-05 NOTE — Telephone Encounter (Signed)
Called patient and informed him of lab results and advised him per Dr. Agustin Cree to start lasix 20 mg daily. Patient verbally understood. No further questions.

## 2019-12-23 ENCOUNTER — Other Ambulatory Visit: Payer: Self-pay | Admitting: Cardiology

## 2020-01-12 ENCOUNTER — Other Ambulatory Visit: Payer: Self-pay | Admitting: Cardiology

## 2020-01-12 ENCOUNTER — Telehealth: Payer: Self-pay | Admitting: Cardiology

## 2020-01-12 NOTE — Telephone Encounter (Signed)
Patient reports he discussed with Dr. Agustin Cree that he was supposed to be on metoprolol succinate 150 mg daily. Will confirm with Dr. Agustin Cree since his chart says 100 mg.

## 2020-01-12 NOTE — Telephone Encounter (Signed)
Pt c/o medication issue:  1. Name of Medication: metoprolol succinate (TOPROL-XL) 100 MG 24 hr tablet  2. How are you currently taking this medication (dosage and times per day)? 1 tablet once a day  3. Are you having a reaction (difficulty breathing--STAT)? no  4. What is your medication issue? Patient states he needs a new prescription for 150mg  instead of 100mg  of the metoprolol.

## 2020-01-13 MED ORDER — METOPROLOL SUCCINATE ER 100 MG PO TB24: ORAL_TABLET | ORAL | 2 refills | Status: DC

## 2020-01-13 NOTE — Telephone Encounter (Signed)
Metoprolol succinate 150 mg daily filled.

## 2020-01-13 NOTE — Telephone Encounter (Signed)
Yes we can go to 150 mg of metoprolol succinate.

## 2020-01-16 ENCOUNTER — Telehealth: Payer: Self-pay | Admitting: Cardiology

## 2020-01-16 NOTE — Telephone Encounter (Signed)
*  STAT* If patient is at the pharmacy, call can be transferred to refill team.   1. Which medications need to be refilled? (please list name of each medication and dose if known) metoprolol 150 mg  2. Which pharmacy/location (including street and city if local pharmacy) is medication to be sent to? [facility] [place]   3. Do they need a 30 day or 90 day supply? 90 patient the medication is not filled.

## 2020-01-16 NOTE — Telephone Encounter (Signed)
This was refilled on 01/13/20. Confirmation from pharmacy was received.

## 2020-01-27 ENCOUNTER — Ambulatory Visit (INDEPENDENT_AMBULATORY_CARE_PROVIDER_SITE_OTHER): Payer: PPO

## 2020-01-27 ENCOUNTER — Other Ambulatory Visit: Payer: Self-pay

## 2020-01-27 DIAGNOSIS — I2510 Atherosclerotic heart disease of native coronary artery without angina pectoris: Secondary | ICD-10-CM | POA: Diagnosis not present

## 2020-01-27 NOTE — Progress Notes (Signed)
Complete echocardiogram has been performed.  Jimmy Moses Odoherty RDCS, RVT 

## 2020-02-13 ENCOUNTER — Other Ambulatory Visit: Payer: Self-pay | Admitting: Cardiology

## 2020-03-09 DIAGNOSIS — Z0000 Encounter for general adult medical examination without abnormal findings: Secondary | ICD-10-CM

## 2020-03-09 HISTORY — DX: Encounter for general adult medical examination without abnormal findings: Z00.00

## 2020-03-28 DIAGNOSIS — Z7984 Long term (current) use of oral hypoglycemic drugs: Secondary | ICD-10-CM | POA: Diagnosis not present

## 2020-03-28 DIAGNOSIS — I10 Essential (primary) hypertension: Secondary | ICD-10-CM | POA: Diagnosis not present

## 2020-03-28 DIAGNOSIS — Z7982 Long term (current) use of aspirin: Secondary | ICD-10-CM | POA: Diagnosis not present

## 2020-03-28 DIAGNOSIS — Z79899 Other long term (current) drug therapy: Secondary | ICD-10-CM | POA: Diagnosis not present

## 2020-03-28 DIAGNOSIS — E782 Mixed hyperlipidemia: Secondary | ICD-10-CM | POA: Diagnosis not present

## 2020-03-28 DIAGNOSIS — E119 Type 2 diabetes mellitus without complications: Secondary | ICD-10-CM | POA: Diagnosis not present

## 2020-03-28 DIAGNOSIS — Z125 Encounter for screening for malignant neoplasm of prostate: Secondary | ICD-10-CM | POA: Diagnosis not present

## 2020-03-28 DIAGNOSIS — R2681 Unsteadiness on feet: Secondary | ICD-10-CM | POA: Diagnosis not present

## 2020-04-16 ENCOUNTER — Other Ambulatory Visit: Payer: Self-pay | Admitting: Cardiology

## 2020-04-29 ENCOUNTER — Other Ambulatory Visit: Payer: Self-pay | Admitting: Cardiology

## 2020-05-09 ENCOUNTER — Other Ambulatory Visit: Payer: Self-pay

## 2020-05-11 ENCOUNTER — Encounter: Payer: Self-pay | Admitting: Cardiology

## 2020-05-11 ENCOUNTER — Ambulatory Visit: Payer: PPO | Admitting: Cardiology

## 2020-05-11 ENCOUNTER — Other Ambulatory Visit: Payer: Self-pay

## 2020-05-11 VITALS — BP 150/96 | HR 84 | Ht 73.0 in | Wt 259.4 lb

## 2020-05-11 DIAGNOSIS — E785 Hyperlipidemia, unspecified: Secondary | ICD-10-CM | POA: Diagnosis not present

## 2020-05-11 DIAGNOSIS — I509 Heart failure, unspecified: Secondary | ICD-10-CM

## 2020-05-11 DIAGNOSIS — I10 Essential (primary) hypertension: Secondary | ICD-10-CM | POA: Diagnosis not present

## 2020-05-11 DIAGNOSIS — I4821 Permanent atrial fibrillation: Secondary | ICD-10-CM | POA: Diagnosis not present

## 2020-05-11 DIAGNOSIS — Z951 Presence of aortocoronary bypass graft: Secondary | ICD-10-CM

## 2020-05-11 DIAGNOSIS — I2510 Atherosclerotic heart disease of native coronary artery without angina pectoris: Secondary | ICD-10-CM

## 2020-05-11 HISTORY — DX: Presence of aortocoronary bypass graft: Z95.1

## 2020-05-11 MED ORDER — FUROSEMIDE 40 MG PO TABS: 40.0000 mg | ORAL_TABLET | Freq: Every day | ORAL | 1 refills | Status: DC

## 2020-05-11 NOTE — Progress Notes (Signed)
Cardiology Office Note:    Date:  05/11/2020   ID:  Robert Spears, DOB 01/14/1951, MRN [id number]  PCP:  Algis Greenhouse, MD  Cardiologist:  Jenne Campus, MD    Referring MD: Algis Greenhouse, MD   Chief Complaint  Patient presents with  . Follow-up    4 MO FU   Doing better now  History of Present Illness:    Robert Spears is a 69 y.o. male with past medical history significant for coronary artery disease, status post coronary artery bypass graft years ago, permanent atrial fibrillation, diabetes, dyslipidemia.  He comes today to my office for follow-up.  Overall he is busy working but lately he is started experiencing swelling of lower extremities.  His Norvasc has been cut down and a half he takes only 5 mg and swelling improve but still quite significantly present.  Denies have any chest pain, tightness, pressure, burning in the chest chest fatigue and tiredness.  No palpitations.  Past Medical History:  Diagnosis Date  . A-fib ([place])   . Allergy to bee sting   . Diabetes mellitus   . High blood pressure   . High cholesterol   . Stroke ([place])   . Weight loss     Past Surgical History:  Procedure Laterality Date  . ARTERIAL BYPASS SURGRY    . CARDIAC SURGERY    . CORONARY ARTERY BYPASS GRAFT    . CORONARY STENT PLACEMENT      Current Medications: Current Meds  Medication Sig  . amLODipine (NORVASC) 5 MG tablet Take 2 tablets by mouth once daily  . aspirin EC 81 MG tablet Take 81 mg by mouth daily.   Marland Kitchen atorvastatin (LIPITOR) 80 MG tablet Take 80 mg by mouth daily.   Marland Kitchen ELIQUIS 5 MG TABS tablet Take 1 tablet by mouth twice daily  . EPINEPHrine (EPIPEN 2-PAK) 0.3 mg/0.3 mL IJ SOAJ injection Use as directed for life-threatening allergic reaction.  . Evolocumab (REPATHA SURECLICK) [id number] MG/ML SOAJ Inject 140 mg into the skin every 14 (fourteen) days. cholesterol  . ezetimibe (ZETIA) 10 MG tablet Take 1 tablet by mouth once daily  . glimepiride (AMARYL) 2 MG tablet  Take 2 mg by mouth daily.   Marland Kitchen losartan (COZAAR) 100 MG tablet Take 1 tablet by mouth once daily  . metFORMIN (GLUCOPHAGE) 1000 MG tablet Take 1,000 mg by mouth 2 (two) times daily.  . metoprolol succinate (TOPROL-XL) 100 MG 24 hr tablet TAKE 1 & 1/2 (ONE & ONE-HALF) TABLETS BY MOUTH ONCE DAILY  . Multiple Vitamin (MULTI-VITAMINS) TABS Take 1 tablet by mouth daily.   . nitroGLYCERIN (NITROSTAT) 0.4 MG SL tablet Place 1 tablet (0.4 mg total) under the tongue every 5 (five) minutes as needed.  Glory Rosebush ULTRA test strip 1 each daily.     Allergies:   Bee venom   Social History   Socioeconomic History  . Marital status: Married    Spouse name: Robert Spears  . Number of children: 2  . Years of education: Not on file  . Highest education level: Associate degree: occupational, Hotel manager, or vocational program  Occupational History  . Occupation: retired  Tobacco Use  . Smoking status: Former Smoker    Quit date: 02/25/1996    Years since quitting: 24.2  . Smokeless tobacco: Former Network engineer and Sexual Activity  . Alcohol use: No  . Drug use: No  . Sexual activity: Not on file  Other Topics Concern  . Not on  file  Social History Narrative   Patient is left-handed. He lives with his wife in a one level home. He drinks 2 cups of coffee a day and an occasional diet soda. He does not regularly exercise.   Social Determinants of Health   Financial Resource Strain:   . Difficulty of Paying Living Expenses:   Food Insecurity:   . Worried About Charity fundraiser in the Last Year:   . Arboriculturist in the Last Year:   Transportation Needs:   . Film/video editor (Medical):   Marland Kitchen Lack of Transportation (Non-Medical):   Physical Activity:   . Days of Exercise per Week:   . Minutes of Exercise per Session:   Stress:   . Feeling of Stress :   Social Connections:   . Frequency of Communication with Friends and Family:   . Frequency of Social Gatherings with Friends and Family:   .  Attends Religious Services:   . Active Member of Clubs or Organizations:   . Attends Archivist Meetings:   Marland Kitchen Marital Status:      Family History: The patient's family history includes Cancer in his sister; Diabetes in his brother, mother, and sister; Heart attack in his brother; Heart disease in his brother and sister; Heart disease (age of onset: 22) in his mother; High blood pressure in his brother and mother; Other (age of onset: 73) in his father. ROS:   Please see the history of present illness.    All 14 point review of systems negative except as described per history of present illness  EKGs/Labs/Other Studies Reviewed:     EKG done today showed atrial fibrillation, controlled ventricular rate, right bundle branch block, inferior wall MI, nonspecific ST segment changes Recent Labs: 12/01/2019: BUN 16; Creatinine, Ser 1.19; Potassium 4.5; Sodium 139  Recent Lipid Panel No results found for: CHOL, TRIG, HDL, CHOLHDL, VLDL, LDLCALC, LDLDIRECT  Physical Exam:    VS:  BP (!) 150/96   Pulse 84   Ht 6\' 1"  (1.854 m)   Wt 259 lb 6.4 oz (117.7 kg)   SpO2 97%   BMI 34.22 kg/m     Wt Readings from Last 3 Encounters:  05/11/20 259 lb 6.4 oz (117.7 kg)  12/01/19 257 lb 9.6 oz (116.8 kg)  05/16/19 260 lb (117.9 kg)     GEN:  Well nourished, well developed in no acute distress HEENT: Normal NECK: No JVD; No carotid bruits LYMPHATICS: No lymphadenopathy CARDIAC: RRR, no murmurs, no rubs, no gallops RESPIRATORY:  Clear to auscultation without rales, wheezing or rhonchi  ABDOMEN: Soft, non-tender, non-distended MUSCULOSKELETAL:  No edema; No deformity  SKIN: Warm and dry LOWER EXTREMITIES: no swelling NEUROLOGIC:  Alert and oriented x 3 PSYCHIATRIC:  Normal affect   ASSESSMENT:    1. Coronary artery disease involving native coronary artery of native heart without angina pectoris   2. Permanent atrial fibrillation (HCC)   3. Status post coronary artery bypass graft    4. Essential hypertension   5. Dyslipidemia    PLAN:    In order of problems listed above:  1. Coronary artery disease status post coronary artery bypass graft.  Stable denies have any chest pain.  On appropriate medications which I will continue. 2. Permanent atrial fibrillation with chads 2 vascular equals 4.  We will continue anticoagulation with Eliquis. 3. Essential hypertension blood pressure elevated today.  Also he does have a swelling of lower extremities.  I will increase dose  of his furosemide to 40 mg daily, will check his Chem-7 today and will check Chem-7 a week later.  I will also schedule him to have an echocardiogram to assess left ventricle ejection fraction.  I am worried about potentially having worsening of his left ventricle ejection fraction. 4. Dyslipidemia I did review laboratory test from his primary care physician which showed LDL of 76 and HDL of 30.  This is from April of this year. 5. I will see him rather quickly am concerned about swelling of lower extremities.  I get him back to my office within the next month or so.   Medication Adjustments/Labs and Tests Ordered: Current medicines are reviewed at length with the patient today.  Concerns regarding medicines are outlined above.  No orders of the defined types were placed in this encounter.  Medication changes: No orders of the defined types were placed in this encounter.   Signed, Park Liter, MD, [facility] 05/11/2020 2:36 PM    [place]

## 2020-05-11 NOTE — Patient Instructions (Signed)
Medication Instructions:  Your physician has recommended you make the following change in your medication:   INCREASE: Lasix to 40 mg daily    *If you need a refill on your cardiac medications before your next appointment, please call your pharmacy*   Lab Work: Your physician recommends that you return for lab work today: bmp, pro bnp   If you have labs (blood work) drawn today and your tests are completely normal, you will receive your results only by: Marland Kitchen MyChart Message (if you have MyChart) OR . A paper copy in the mail If you have any lab test that is abnormal or we need to change your treatment, we will call you to review the results.   Testing/Procedures: Your physician has requested that you have an echocardiogram. Echocardiography is a painless test that uses sound waves to create images of your heart. It provides your doctor with information about the size and shape of your heart and how well your heart's chambers and valves are working. This procedure takes approximately one hour. There are no restrictions for this procedure.     Follow-Up: At [facility], you and your health needs are our priority.  As part of our continuing mission to provide you with exceptional heart care, we have created designated Provider Care Teams.  These Care Teams include your primary Cardiologist (physician) and Advanced Practice Providers (APPs -  Physician Assistants and Nurse Practitioners) who all work together to provide you with the care you need, when you need it.  We recommend signing up for the patient portal called "MyChart".  Sign up information is provided on this After Visit Summary.  MyChart is used to connect with patients for Virtual Visits (Telemedicine).  Patients are able to view lab/test results, encounter notes, upcoming appointments, etc.  Non-urgent messages can be sent to your provider as well.   To learn more about what you can do with MyChart, go to  [email or web address].    Your next appointment:   1 month(s)  The format for your next appointment:   In Person  Provider:   Jenne Campus, MD   Other Instructions   Echocardiogram An echocardiogram is a procedure that uses painless sound waves (ultrasound) to produce an image of the heart. Images from an echocardiogram can provide important information about:  Signs of coronary artery disease (CAD).  Aneurysm detection. An aneurysm is a weak or damaged part of an artery wall that bulges out from the normal force of blood pumping through the body.  Heart size and shape. Changes in the size or shape of the heart can be associated with certain conditions, including heart failure, aneurysm, and CAD.  Heart muscle function.  Heart valve function.  Signs of a past heart attack.  Fluid buildup around the heart.  Thickening of the heart muscle.  A tumor or infectious growth around the heart valves. Tell a health care provider about:  Any allergies you have.  All medicines you are taking, including vitamins, herbs, eye drops, creams, and over-the-counter medicines.  Any blood disorders you have.  Any surgeries you have had.  Any medical conditions you have.  Whether you are pregnant or may be pregnant. What are the risks? Generally, this is a safe procedure. However, problems may occur, including:  Allergic reaction to dye (contrast) that may be used during the procedure. What happens before the procedure? No specific preparation is needed. You may eat and drink normally. What happens during the procedure?   An IV  tube may be inserted into one of your veins.  You may receive contrast through this tube. A contrast is an injection that improves the quality of the pictures from your heart.  A gel will be applied to your chest.  A wand-like tool (transducer) will be moved over your chest. The gel will help to transmit the sound waves from the  transducer.  The sound waves will harmlessly bounce off of your heart to allow the heart images to be captured in real-time motion. The images will be recorded on a computer. The procedure may vary among health care providers and hospitals. What happens after the procedure?  You may return to your normal, everyday life, including diet, activities, and medicines, unless your health care provider tells you not to do that. Summary  An echocardiogram is a procedure that uses painless sound waves (ultrasound) to produce an image of the heart.  Images from an echocardiogram can provide important information about the size and shape of your heart, heart muscle function, heart valve function, and fluid buildup around your heart.  You do not need to do anything to prepare before this procedure. You may eat and drink normally.  After the echocardiogram is completed, you may return to your normal, everyday life, unless your health care provider tells you not to do that. This information is not intended to replace advice given to you by your health care provider. Make sure you discuss any questions you have with your health care provider. Document Revised: 03/24/2019 Document Reviewed: 01/03/2017 Elsevier Patient Education  [place].

## 2020-05-12 LAB — BASIC METABOLIC PANEL
BUN/Creatinine Ratio: 11 (ref 10–24)
BUN: 17 mg/dL (ref 8–27)
CO2: 21 mmol/L (ref 20–29)
Calcium: 9.2 mg/dL (ref 8.6–10.2)
Chloride: 105 mmol/L (ref 96–106)
Creatinine, Ser: 1.51 mg/dL — ABNORMAL HIGH (ref 0.76–1.27)
GFR calc Af Amer: 53 mL/min/{1.73_m2} — ABNORMAL LOW (ref >59)
GFR calc non Af Amer: 46 mL/min/{1.73_m2} — ABNORMAL LOW (ref >59)
Glucose: 171 mg/dL — ABNORMAL HIGH (ref 65–99)
Potassium: 4.8 mmol/L (ref 3.5–5.2)
Sodium: 140 mmol/L (ref 134–144)

## 2020-05-12 LAB — PRO B NATRIURETIC PEPTIDE: NT-Pro BNP: 2115 pg/mL — ABNORMAL HIGH (ref 0–376)

## 2020-05-17 ENCOUNTER — Telehealth: Payer: Self-pay | Admitting: Emergency Medicine

## 2020-05-17 DIAGNOSIS — I509 Heart failure, unspecified: Secondary | ICD-10-CM

## 2020-05-17 NOTE — Telephone Encounter (Signed)
-----   Message from Park Liter, MD sent at 05/15/2020  6:10 PM EDT ----- Kidney function deteriorated, however this is at baseline that was 8 months ago.  Please recheck Chem-7 and proBNP

## 2020-05-17 NOTE — Telephone Encounter (Signed)
Called patient informed him of lab results and recommendations to have lab work completed. He verbally understood no further questions.

## 2020-05-18 DIAGNOSIS — I509 Heart failure, unspecified: Secondary | ICD-10-CM | POA: Diagnosis not present

## 2020-05-19 LAB — BASIC METABOLIC PANEL
BUN/Creatinine Ratio: 10 (ref 10–24)
BUN: 17 mg/dL (ref 8–27)
CO2: 22 mmol/L (ref 20–29)
Calcium: 9.3 mg/dL (ref 8.6–10.2)
Chloride: 104 mmol/L (ref 96–106)
Creatinine, Ser: 1.7 mg/dL — ABNORMAL HIGH (ref 0.76–1.27)
GFR calc Af Amer: 46 mL/min/{1.73_m2} — ABNORMAL LOW (ref >59)
GFR calc non Af Amer: 40 mL/min/{1.73_m2} — ABNORMAL LOW (ref >59)
Glucose: 169 mg/dL — ABNORMAL HIGH (ref 65–99)
Potassium: 4.8 mmol/L (ref 3.5–5.2)
Sodium: 142 mmol/L (ref 134–144)

## 2020-05-19 LAB — PRO B NATRIURETIC PEPTIDE: NT-Pro BNP: 1771 pg/mL — ABNORMAL HIGH (ref 0–376)

## 2020-05-26 ENCOUNTER — Other Ambulatory Visit: Payer: Self-pay | Admitting: Cardiology

## 2020-05-31 ENCOUNTER — Ambulatory Visit (INDEPENDENT_AMBULATORY_CARE_PROVIDER_SITE_OTHER): Payer: PPO

## 2020-05-31 ENCOUNTER — Other Ambulatory Visit: Payer: Self-pay

## 2020-05-31 DIAGNOSIS — I2510 Atherosclerotic heart disease of native coronary artery without angina pectoris: Secondary | ICD-10-CM | POA: Diagnosis not present

## 2020-05-31 DIAGNOSIS — I10 Essential (primary) hypertension: Secondary | ICD-10-CM | POA: Diagnosis not present

## 2020-05-31 DIAGNOSIS — I4821 Permanent atrial fibrillation: Secondary | ICD-10-CM | POA: Diagnosis not present

## 2020-05-31 DIAGNOSIS — Z951 Presence of aortocoronary bypass graft: Secondary | ICD-10-CM

## 2020-05-31 NOTE — Progress Notes (Signed)
Complete echocardiogram has been performed.  Jimmy Jeter Tomey RDCS, RVT 

## 2020-06-19 DIAGNOSIS — E785 Hyperlipidemia, unspecified: Secondary | ICD-10-CM | POA: Diagnosis not present

## 2020-06-19 DIAGNOSIS — Z951 Presence of aortocoronary bypass graft: Secondary | ICD-10-CM | POA: Diagnosis not present

## 2020-06-19 DIAGNOSIS — Z049 Encounter for examination and observation for unspecified reason: Secondary | ICD-10-CM | POA: Diagnosis not present

## 2020-06-19 DIAGNOSIS — I680 Cerebral amyloid angiopathy: Secondary | ICD-10-CM | POA: Diagnosis not present

## 2020-06-19 DIAGNOSIS — E119 Type 2 diabetes mellitus without complications: Secondary | ICD-10-CM | POA: Diagnosis not present

## 2020-06-19 DIAGNOSIS — I5022 Chronic systolic (congestive) heart failure: Secondary | ICD-10-CM | POA: Diagnosis not present

## 2020-06-19 DIAGNOSIS — I130 Hypertensive heart and chronic kidney disease with heart failure and stage 1 through stage 4 chronic kidney disease, or unspecified chronic kidney disease: Secondary | ICD-10-CM | POA: Diagnosis not present

## 2020-06-19 DIAGNOSIS — Z87891 Personal history of nicotine dependence: Secondary | ICD-10-CM | POA: Diagnosis not present

## 2020-06-19 DIAGNOSIS — R410 Disorientation, unspecified: Secondary | ICD-10-CM | POA: Diagnosis not present

## 2020-06-19 DIAGNOSIS — I618 Other nontraumatic intracerebral hemorrhage: Secondary | ICD-10-CM | POA: Diagnosis not present

## 2020-06-19 DIAGNOSIS — N189 Chronic kidney disease, unspecified: Secondary | ICD-10-CM | POA: Diagnosis not present

## 2020-06-19 DIAGNOSIS — I25810 Atherosclerosis of coronary artery bypass graft(s) without angina pectoris: Secondary | ICD-10-CM | POA: Diagnosis not present

## 2020-06-19 DIAGNOSIS — Z8679 Personal history of other diseases of the circulatory system: Secondary | ICD-10-CM | POA: Diagnosis not present

## 2020-06-19 DIAGNOSIS — I517 Cardiomegaly: Secondary | ICD-10-CM | POA: Diagnosis not present

## 2020-06-19 DIAGNOSIS — I2510 Atherosclerotic heart disease of native coronary artery without angina pectoris: Secondary | ICD-10-CM | POA: Diagnosis not present

## 2020-06-19 DIAGNOSIS — I161 Hypertensive emergency: Secondary | ICD-10-CM | POA: Diagnosis not present

## 2020-06-19 DIAGNOSIS — I482 Chronic atrial fibrillation, unspecified: Secondary | ICD-10-CM | POA: Diagnosis not present

## 2020-06-19 DIAGNOSIS — R4781 Slurred speech: Secondary | ICD-10-CM | POA: Diagnosis not present

## 2020-06-19 DIAGNOSIS — I619 Nontraumatic intracerebral hemorrhage, unspecified: Secondary | ICD-10-CM | POA: Diagnosis not present

## 2020-06-19 DIAGNOSIS — Z8673 Personal history of transient ischemic attack (TIA), and cerebral infarction without residual deficits: Secondary | ICD-10-CM | POA: Diagnosis not present

## 2020-06-19 DIAGNOSIS — N179 Acute kidney failure, unspecified: Secondary | ICD-10-CM | POA: Diagnosis not present

## 2020-06-19 DIAGNOSIS — Z7901 Long term (current) use of anticoagulants: Secondary | ICD-10-CM | POA: Diagnosis not present

## 2020-06-19 DIAGNOSIS — E871 Hypo-osmolality and hyponatremia: Secondary | ICD-10-CM | POA: Diagnosis not present

## 2020-06-19 DIAGNOSIS — S06350A Traumatic hemorrhage of left cerebrum without loss of consciousness, initial encounter: Secondary | ICD-10-CM | POA: Diagnosis not present

## 2020-06-19 DIAGNOSIS — Z7982 Long term (current) use of aspirin: Secondary | ICD-10-CM | POA: Diagnosis not present

## 2020-06-19 DIAGNOSIS — Z794 Long term (current) use of insulin: Secondary | ICD-10-CM | POA: Diagnosis not present

## 2020-06-19 DIAGNOSIS — E1122 Type 2 diabetes mellitus with diabetic chronic kidney disease: Secondary | ICD-10-CM | POA: Diagnosis not present

## 2020-06-19 DIAGNOSIS — R519 Headache, unspecified: Secondary | ICD-10-CM | POA: Diagnosis not present

## 2020-06-19 DIAGNOSIS — I480 Paroxysmal atrial fibrillation: Secondary | ICD-10-CM | POA: Diagnosis not present

## 2020-06-19 DIAGNOSIS — I129 Hypertensive chronic kidney disease with stage 1 through stage 4 chronic kidney disease, or unspecified chronic kidney disease: Secondary | ICD-10-CM | POA: Diagnosis not present

## 2020-06-19 DIAGNOSIS — Z79899 Other long term (current) drug therapy: Secondary | ICD-10-CM | POA: Diagnosis not present

## 2020-06-19 DIAGNOSIS — I4891 Unspecified atrial fibrillation: Secondary | ICD-10-CM | POA: Diagnosis not present

## 2020-06-19 DIAGNOSIS — N400 Enlarged prostate without lower urinary tract symptoms: Secondary | ICD-10-CM | POA: Diagnosis not present

## 2020-06-19 DIAGNOSIS — E854 Organ-limited amyloidosis: Secondary | ICD-10-CM | POA: Diagnosis not present

## 2020-06-19 DIAGNOSIS — I10 Essential (primary) hypertension: Secondary | ICD-10-CM | POA: Diagnosis not present

## 2020-06-19 DIAGNOSIS — H5340 Unspecified visual field defects: Secondary | ICD-10-CM | POA: Diagnosis not present

## 2020-06-23 DIAGNOSIS — I619 Nontraumatic intracerebral hemorrhage, unspecified: Secondary | ICD-10-CM | POA: Insufficient documentation

## 2020-06-23 HISTORY — DX: Nontraumatic intracerebral hemorrhage, unspecified: I61.9

## 2020-06-28 ENCOUNTER — Other Ambulatory Visit: Payer: Self-pay

## 2020-06-28 ENCOUNTER — Encounter: Payer: Self-pay | Admitting: Cardiology

## 2020-06-28 ENCOUNTER — Ambulatory Visit: Payer: PPO | Admitting: Cardiology

## 2020-06-28 VITALS — BP 138/78 | HR 73 | Ht 73.0 in | Wt 268.0 lb

## 2020-06-28 DIAGNOSIS — Z951 Presence of aortocoronary bypass graft: Secondary | ICD-10-CM | POA: Diagnosis not present

## 2020-06-28 DIAGNOSIS — I629 Nontraumatic intracranial hemorrhage, unspecified: Secondary | ICD-10-CM

## 2020-06-28 DIAGNOSIS — E785 Hyperlipidemia, unspecified: Secondary | ICD-10-CM | POA: Diagnosis not present

## 2020-06-28 DIAGNOSIS — I4821 Permanent atrial fibrillation: Secondary | ICD-10-CM

## 2020-06-28 DIAGNOSIS — M7989 Other specified soft tissue disorders: Secondary | ICD-10-CM | POA: Insufficient documentation

## 2020-06-28 DIAGNOSIS — I509 Heart failure, unspecified: Secondary | ICD-10-CM

## 2020-06-28 DIAGNOSIS — I10 Essential (primary) hypertension: Secondary | ICD-10-CM | POA: Diagnosis not present

## 2020-06-28 HISTORY — DX: Other specified soft tissue disorders: M79.89

## 2020-06-28 HISTORY — DX: Nontraumatic intracranial hemorrhage, unspecified: I62.9

## 2020-06-28 NOTE — Patient Instructions (Signed)
Medication Instructions:  Your physician has recommended you make the following change in your medication:  STOP: amlodipine  *If you need a refill on your cardiac medications before your next appointment, please call your pharmacy*   Lab Work: Your physician recommends that you return for lab work today: bmp, pro bnp   If you have labs (blood work) drawn today and your tests are completely normal, you will receive your results only by: Marland Kitchen MyChart Message (if you have MyChart) OR . A paper copy in the mail If you have any lab test that is abnormal or we need to change your treatment, we will call you to review the results.   Testing/Procedures: None.    Follow-Up: At [facility], you and your health needs are our priority.  As part of our continuing mission to provide you with exceptional heart care, we have created designated Provider Care Teams.  These Care Teams include your primary Cardiologist (physician) and Advanced Practice Providers (APPs -  Physician Assistants and Nurse Practitioners) who all work together to provide you with the care you need, when you need it.  We recommend signing up for the patient portal called "MyChart".  Sign up information is provided on this After Visit Summary.  MyChart is used to connect with patients for Virtual Visits (Telemedicine).  Patients are able to view lab/test results, encounter notes, upcoming appointments, etc.  Non-urgent messages can be sent to your provider as well.   To learn more about what you can do with MyChart, go to [email or web address].    Your next appointment:   1 month(s)  The format for your next appointment:   In Person  Provider:   Jenne Campus, MD   Other Instructions

## 2020-06-28 NOTE — Progress Notes (Signed)
Cardiology Office Note:    Date:  06/28/2020   ID:  Robert Spears, DOB [birth year], MRN [id number]  PCP:  Algis Greenhouse, MD  Cardiologist:  Jenne Campus, MD    Referring MD: Algis Greenhouse, MD   No chief complaint on file. Doing better  History of Present Illness:    Robert Spears is a 69 y.o. male with past medical history significant for coronary artery disease status post coronary bypass graft many years ago, permanent atrial fibrillation, diabetes, dyslipidemia.  Recently he ended up going to [facility] because of intracranial bleed.  His anticoagulation has been withdrawn and he is still without anticoagulation.  Gradually improving still a bit slow in speaking he comes in with his wife today to the office to talk about his issue card he complains is swelling of lower extremities.  Swelling is worse at evening time denies have any shortness of breath but has ability to exercise now somewhat limited because of recent intracranial bleed.  Luckily he did not required any surgical intervention.  Past Medical History:  Diagnosis Date  . A-fib ([place])   . Allergy to bee sting   . Diabetes mellitus   . High blood pressure   . High cholesterol   . Stroke ([place])   . Weight loss     Past Surgical History:  Procedure Laterality Date  . ARTERIAL BYPASS SURGRY    . CARDIAC SURGERY    . CORONARY ARTERY BYPASS GRAFT    . CORONARY STENT PLACEMENT      Current Medications: Current Meds  Medication Sig  . atorvastatin (LIPITOR) 80 MG tablet Take 80 mg by mouth daily.   . carvedilol (COREG) 12.5 MG tablet Take 12.5 mg by mouth 2 (two) times daily.  . Evolocumab (REPATHA SURECLICK) 782 MG/ML SOAJ Inject 140 mg into the skin every 14 (fourteen) days. cholesterol  . ezetimibe (ZETIA) 10 MG tablet Take 1 tablet by mouth once daily  . furosemide (LASIX) 40 MG tablet Take 1 tablet (40 mg total) by mouth daily.  Marland Kitchen glimepiride (AMARYL) 2 MG tablet Take 2 mg by mouth daily.   .  hydrALAZINE (APRESOLINE) 25 MG tablet Take by mouth.  . losartan (COZAAR) 100 MG tablet Take 1 tablet by mouth once daily  . metFORMIN (GLUCOPHAGE) 1000 MG tablet Take 1,000 mg by mouth 2 (two) times daily.  Glory Rosebush ULTRA test strip 1 each daily.  . tamsulosin (FLOMAX) 0.4 MG CAPS capsule Take 1 capsule by mouth daily.  . [DISCONTINUED] amLODipine (NORVASC) 5 MG tablet Take 2 tablets by mouth once daily (Patient taking differently: Take 5 mg by mouth daily. )     Allergies:   Bee venom   Social History   Socioeconomic History  . Marital status: Married    Spouse name: [name]  . Number of children: 2  . Years of education: Not on file  . Highest education level: Associate degree: occupational, Hotel manager, or vocational program  Occupational History  . Occupation: retired  Tobacco Use  . Smoking status: Former Smoker    Quit date: 02/25/1996    Years since quitting: 24.3  . Smokeless tobacco: Former Network engineer  . Vaping Use: Never used  Substance and Sexual Activity  . Alcohol use: No  . Drug use: No  . Sexual activity: Not on file  Other Topics Concern  . Not on file  Social History Narrative   Patient is left-handed. He lives with his wife in  a one level home. He drinks 2 cups of coffee a day and an occasional diet soda. He does not regularly exercise.   Social Determinants of Health   Financial Resource Strain:   . Difficulty of Paying Living Expenses:   Food Insecurity:   . Worried About Charity fundraiser in the Last Year:   . Arboriculturist in the Last Year:   Transportation Needs:   . Film/video editor (Medical):   Marland Kitchen Lack of Transportation (Non-Medical):   Physical Activity:   . Days of Exercise per Week:   . Minutes of Exercise per Session:   Stress:   . Feeling of Stress :   Social Connections:   . Frequency of Communication with Friends and Family:   . Frequency of Social Gatherings with Friends and Family:   . Attends Religious Services:   .  Active Member of Clubs or Organizations:   . Attends Archivist Meetings:   Marland Kitchen Marital Status:      Family History: The patient's family history includes Cancer in his sister; Diabetes in his brother, mother, and sister; Heart attack in his brother; Heart disease in his brother and sister; Heart disease (age of onset: 26) in his mother; High blood pressure in his brother and mother; Other (age of onset: 73) in his father. ROS:   Please see the history of present illness.    All 14 point review of systems negative except as described per history of present illness  EKGs/Labs/Other Studies Reviewed:      Recent Labs: 05/18/2020: BUN 17; Creatinine, Ser 1.70; NT-Pro BNP 1,771; Potassium 4.8; Sodium 142  Recent Lipid Panel No results found for: CHOL, TRIG, HDL, CHOLHDL, VLDL, LDLCALC, LDLDIRECT  Physical Exam:    VS:  BP 138/78 (BP Location: Right Arm, Patient Position: Sitting, Cuff Size: Normal)   Pulse 73   Ht 6\' 1"  (1.854 m)   Wt 268 lb (121.6 kg)   SpO2 97%   BMI 35.36 kg/m     Wt Readings from Last 3 Encounters:  06/28/20 268 lb (121.6 kg)  05/11/20 259 lb 6.4 oz (117.7 kg)  12/01/19 257 lb 9.6 oz (116.8 kg)     GEN:  Well nourished, well developed in no acute distress HEENT: Normal NECK: No JVD; No carotid bruits LYMPHATICS: No lymphadenopathy CARDIAC: Irregular, no murmurs, no rubs, no gallops RESPIRATORY:  Clear to auscultation without rales, wheezing or rhonchi  ABDOMEN: Soft, non-tender, non-distended MUSCULOSKELETAL:  No edema; No deformity  SKIN: Warm and dry LOWER EXTREMITIES: no swelling NEUROLOGIC:  Alert and oriented x 3 PSYCHIATRIC:  Normal affect   ASSESSMENT:    1. Congestive heart failure, unspecified HF chronicity, unspecified heart failure type ([place])   2. Status post coronary artery bypass graft   3. Permanent atrial fibrillation ([place])   4. Essential hypertension   5. Dyslipidemia   6. Swelling of both lower extremities   7.  Intracranial bleeding ([place])    PLAN:    In order of problems listed above:  1. Congestive heart failure.  He does have some swelling of lower extremities I will check Chem-7 as well as proBNP today with anticipation of needing to increase the dose of diuretic.  I will ask him to completely discontinue amlodipine he takes only 5 mg daily. 2. Status post coronary bypass graft not stable. 3. Permanent atrial fibrillation obviously anticoagulation has been withdrawn because of intracranial bleed that make the situation extremely complicated.  We initiated  conversation about watchman device.  However watchman device will require some antiplatelets therapy as well as anticoagulation for short period time.  Therefore, we can await for assessment by neurology and neurosurgery with the decision when we will be able to resume short.  Time antiplatelets therapy. 4. Essential hypertension seems to be well controlled continue present management. 5. Dyslipidemia: High intensity statin   Medication Adjustments/Labs and Tests Ordered: Current medicines are reviewed at length with the patient today.  Concerns regarding medicines are outlined above.  Orders Placed This Encounter  Procedures  . Basic metabolic panel  . Pro b natriuretic peptide (BNP)   Medication changes: No orders of the defined types were placed in this encounter.   Signed, Park Liter, MD, [facility] 06/28/2020 [facility]

## 2020-06-29 DIAGNOSIS — N179 Acute kidney failure, unspecified: Secondary | ICD-10-CM | POA: Diagnosis not present

## 2020-06-29 DIAGNOSIS — I619 Nontraumatic intracerebral hemorrhage, unspecified: Secondary | ICD-10-CM | POA: Diagnosis not present

## 2020-06-29 DIAGNOSIS — I680 Cerebral amyloid angiopathy: Secondary | ICD-10-CM | POA: Diagnosis not present

## 2020-06-29 DIAGNOSIS — E854 Organ-limited amyloidosis: Secondary | ICD-10-CM | POA: Diagnosis not present

## 2020-06-29 LAB — BASIC METABOLIC PANEL
BUN/Creatinine Ratio: 12 (ref 10–24)
BUN: 20 mg/dL (ref 8–27)
CO2: 19 mmol/L — ABNORMAL LOW (ref 20–29)
Calcium: 9 mg/dL (ref 8.6–10.2)
Chloride: 106 mmol/L (ref 96–106)
Creatinine, Ser: 1.69 mg/dL — ABNORMAL HIGH (ref 0.76–1.27)
GFR calc Af Amer: 47 mL/min/{1.73_m2} — ABNORMAL LOW (ref >59)
GFR calc non Af Amer: 40 mL/min/{1.73_m2} — ABNORMAL LOW (ref >59)
Glucose: 178 mg/dL — ABNORMAL HIGH (ref 65–99)
Potassium: 5 mmol/L (ref 3.5–5.2)
Sodium: 138 mmol/L (ref 134–144)

## 2020-06-29 LAB — PRO B NATRIURETIC PEPTIDE: NT-Pro BNP: 1898 pg/mL — ABNORMAL HIGH (ref 0–376)

## 2020-07-02 ENCOUNTER — Telehealth: Payer: Self-pay

## 2020-07-02 DIAGNOSIS — R0602 Shortness of breath: Secondary | ICD-10-CM

## 2020-07-02 DIAGNOSIS — I10 Essential (primary) hypertension: Secondary | ICD-10-CM

## 2020-07-02 DIAGNOSIS — I2510 Atherosclerotic heart disease of native coronary artery without angina pectoris: Secondary | ICD-10-CM

## 2020-07-02 NOTE — Telephone Encounter (Signed)
-----   Message from Park Liter, MD sent at 07/02/2020  8:42 AM EDT ----- Please increase furosemide to 60 mg daily, he need to have a Chem-7 and proBNP done within the next week

## 2020-07-02 NOTE — Telephone Encounter (Signed)
Spoke with the patient just now and he let me know that he has not been taking his furosemide 40 mg daily. I told him to go ahead and restart this medication and I would reach out to Dr. Agustin Cree to let him know and to see if he still wanted to increase the patients dose. Patient states that he will come in for lab work within the next 1-2 days. No other issues or concerns were noted at this time.    Encouraged patient to call back with any questions or concerns.

## 2020-07-03 ENCOUNTER — Other Ambulatory Visit: Payer: Self-pay | Admitting: Cardiology

## 2020-07-03 ENCOUNTER — Other Ambulatory Visit: Payer: Self-pay

## 2020-07-03 DIAGNOSIS — I10 Essential (primary) hypertension: Secondary | ICD-10-CM

## 2020-07-03 DIAGNOSIS — R0602 Shortness of breath: Secondary | ICD-10-CM

## 2020-07-03 DIAGNOSIS — I2510 Atherosclerotic heart disease of native coronary artery without angina pectoris: Secondary | ICD-10-CM

## 2020-07-03 NOTE — Telephone Encounter (Signed)
If that is the case he need to start taking furosemide 40 mg daily. It is fine to have blood work done within the next 1 to 2 days

## 2020-07-03 NOTE — Telephone Encounter (Signed)
Patient was told yesterday in our telephone call to come in for labs in the next 1-2 days and to start taking his furosemide 40 mg daily. He verbally understood along with his wife. No other issues or concerns were noted.   Encouraged patient to call back with any questions or concerns.

## 2020-07-04 LAB — BASIC METABOLIC PANEL
BUN/Creatinine Ratio: 11 (ref 10–24)
BUN: 18 mg/dL (ref 8–27)
CO2: 21 mmol/L (ref 20–29)
Calcium: 9.2 mg/dL (ref 8.6–10.2)
Chloride: 105 mmol/L (ref 96–106)
Creatinine, Ser: 1.65 mg/dL — ABNORMAL HIGH (ref 0.76–1.27)
GFR calc Af Amer: 48 mL/min/{1.73_m2} — ABNORMAL LOW (ref >59)
GFR calc non Af Amer: 41 mL/min/{1.73_m2} — ABNORMAL LOW (ref >59)
Glucose: 132 mg/dL — ABNORMAL HIGH (ref 65–99)
Potassium: 4.9 mmol/L (ref 3.5–5.2)
Sodium: 139 mmol/L (ref 134–144)

## 2020-07-04 LAB — PRO B NATRIURETIC PEPTIDE: NT-Pro BNP: 2573 pg/mL — ABNORMAL HIGH (ref 0–376)

## 2020-07-11 DIAGNOSIS — I619 Nontraumatic intracerebral hemorrhage, unspecified: Secondary | ICD-10-CM | POA: Diagnosis not present

## 2020-07-11 DIAGNOSIS — I680 Cerebral amyloid angiopathy: Secondary | ICD-10-CM | POA: Diagnosis not present

## 2020-07-11 DIAGNOSIS — R5383 Other fatigue: Secondary | ICD-10-CM | POA: Diagnosis not present

## 2020-07-11 DIAGNOSIS — Z87891 Personal history of nicotine dependence: Secondary | ICD-10-CM | POA: Diagnosis not present

## 2020-07-11 DIAGNOSIS — I10 Essential (primary) hypertension: Secondary | ICD-10-CM | POA: Diagnosis not present

## 2020-07-11 DIAGNOSIS — E854 Organ-limited amyloidosis: Secondary | ICD-10-CM | POA: Diagnosis not present

## 2020-07-11 DIAGNOSIS — I611 Nontraumatic intracerebral hemorrhage in hemisphere, cortical: Secondary | ICD-10-CM | POA: Diagnosis not present

## 2020-07-16 IMAGING — MR MR HEAD W/O CM
1 series · 10 of 48 positions shown · non-contrast
Comparison: MRI brain 07/09/2015

CLINICAL DATA: TIA. Episode of abnormal speech lasting 20-30
minutes 2 months ago. Patient reports a similar episode 2 years ago.

EXAM:
MRI HEAD WITHOUT CONTRAST
MRA HEAD WITHOUT CONTRAST
TECHNIQUE: Multiplanar, multiecho pulse sequences of the brain and surrounding
structures were obtained without intravenous contrast. Angiographic
images of the head were obtained using MRA technique without
contrast.

[Series 8: [id number] · axial · 0.6mm · 0.26mm/px · z∈[-7,+61]mm · 10 of 149 slices shown]
[im 10/149]
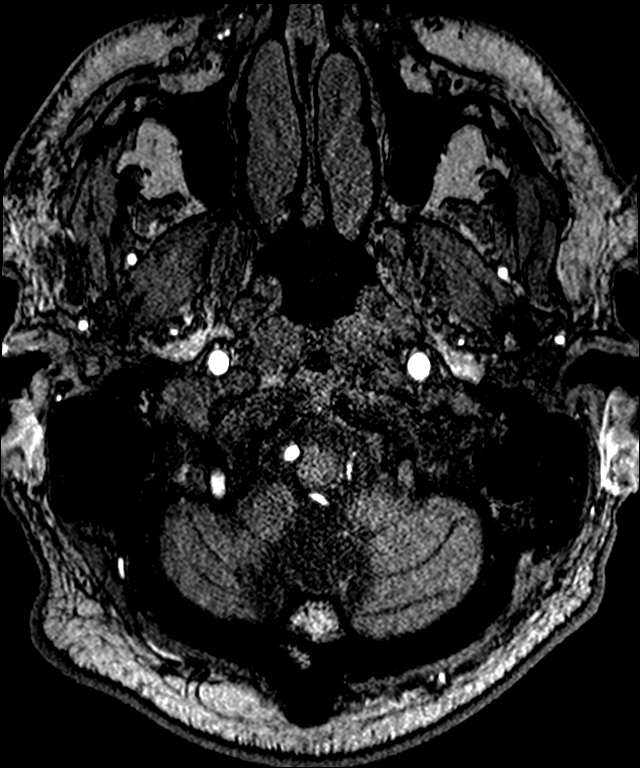
[im 26/149]
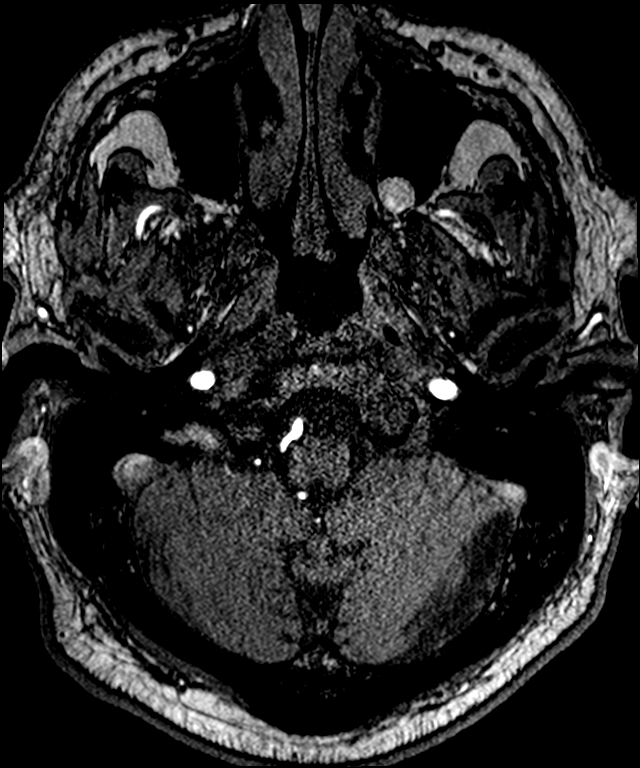
[im 29/149]
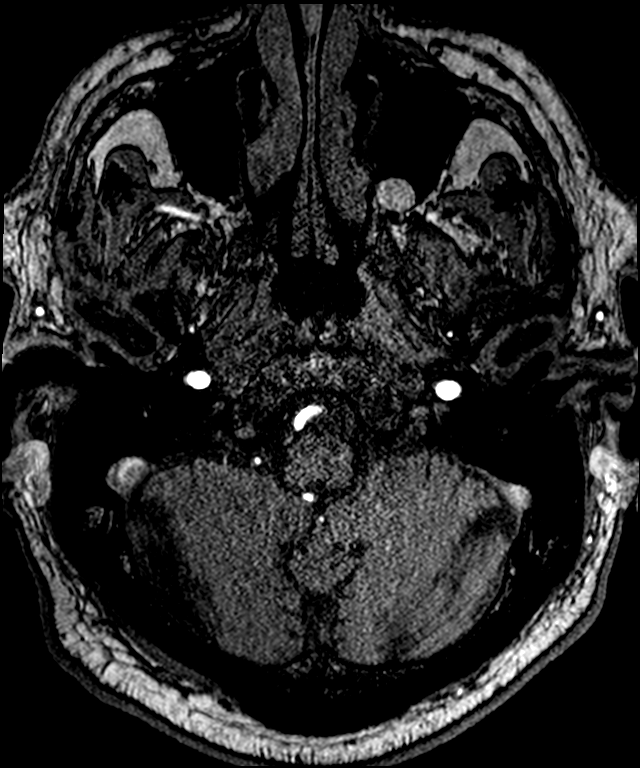
[im 48/149]
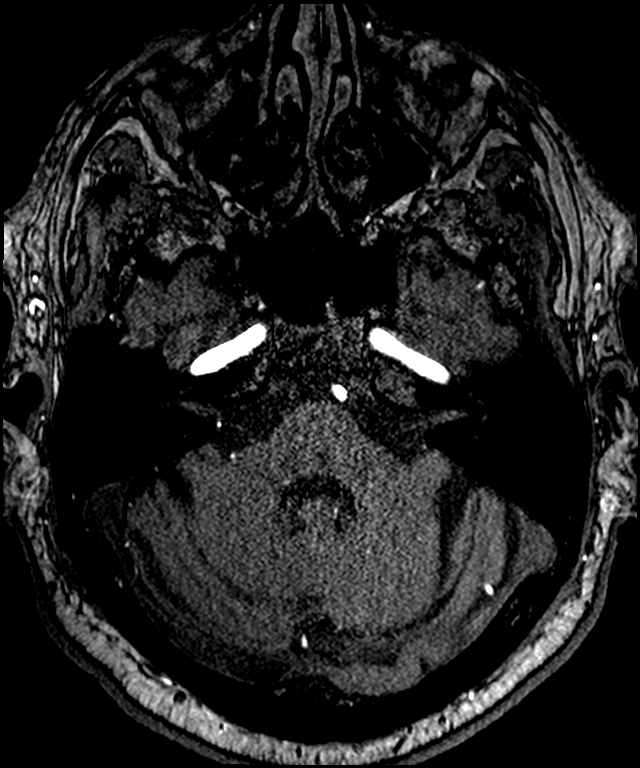
[im 67/149]
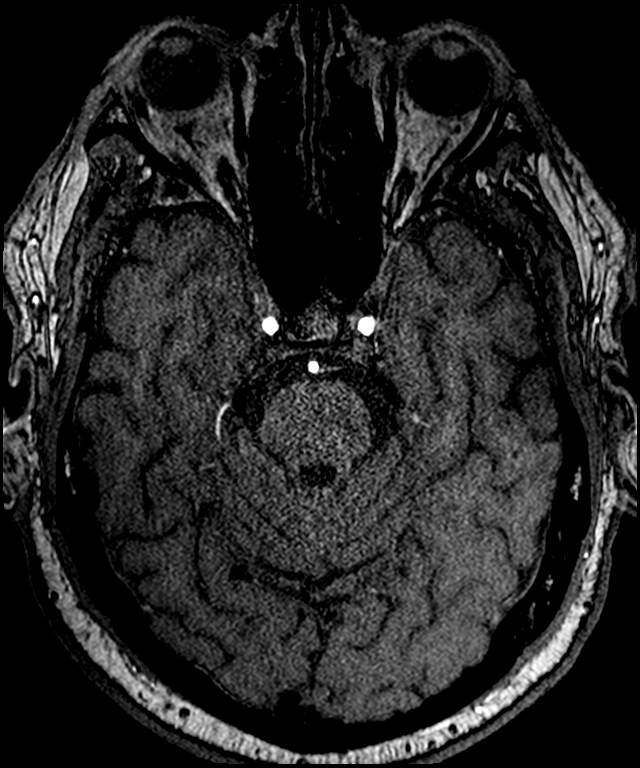
[im 76/149]
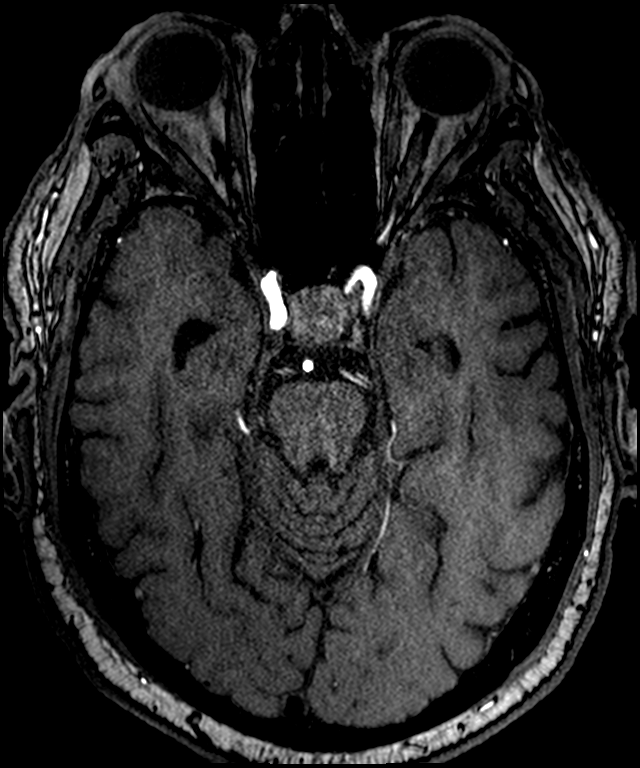
[im 86/149]
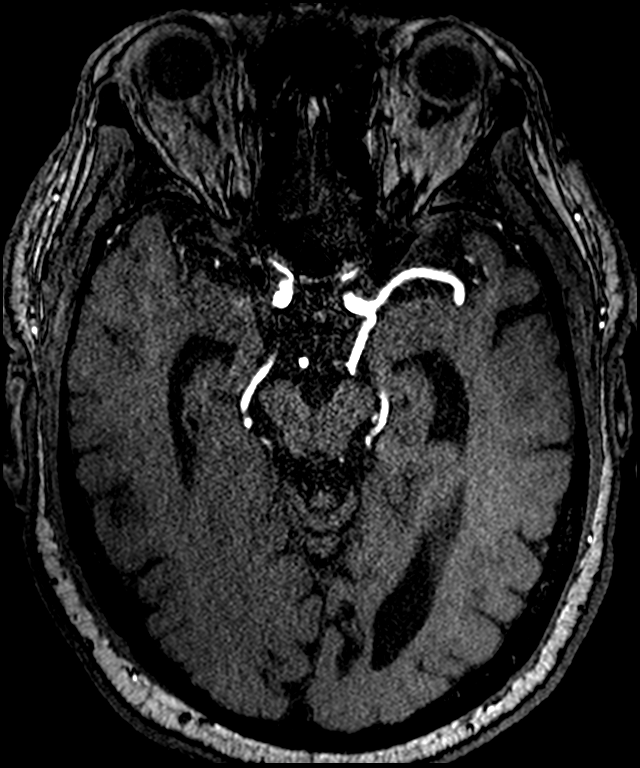
[im 104/149]
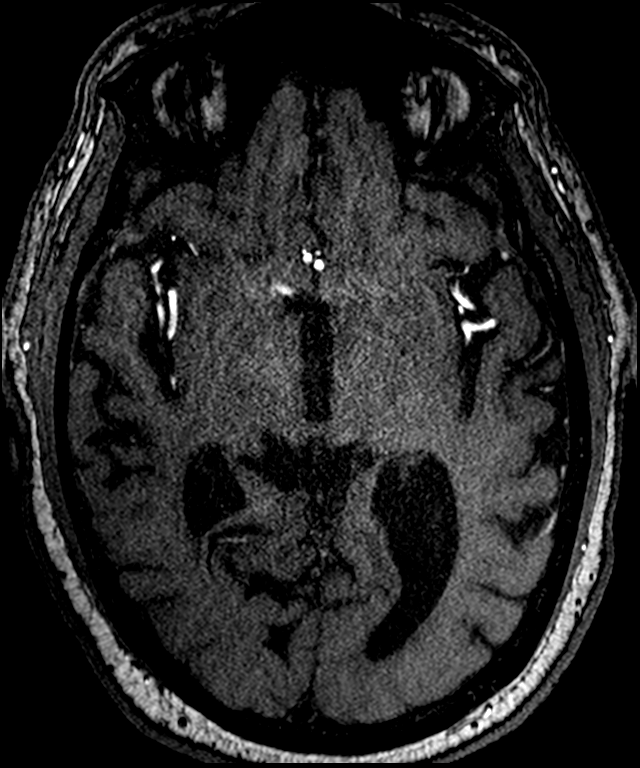
[im 123/149]
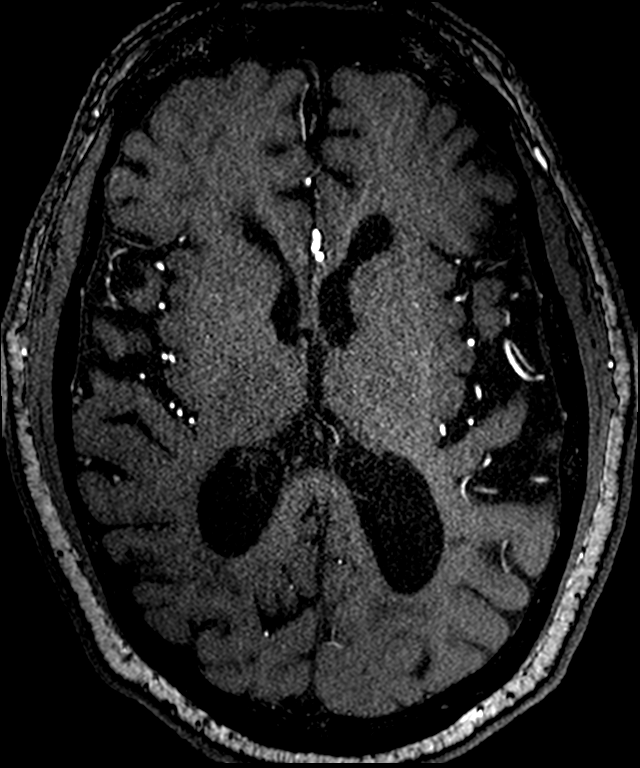
[im 126/149]
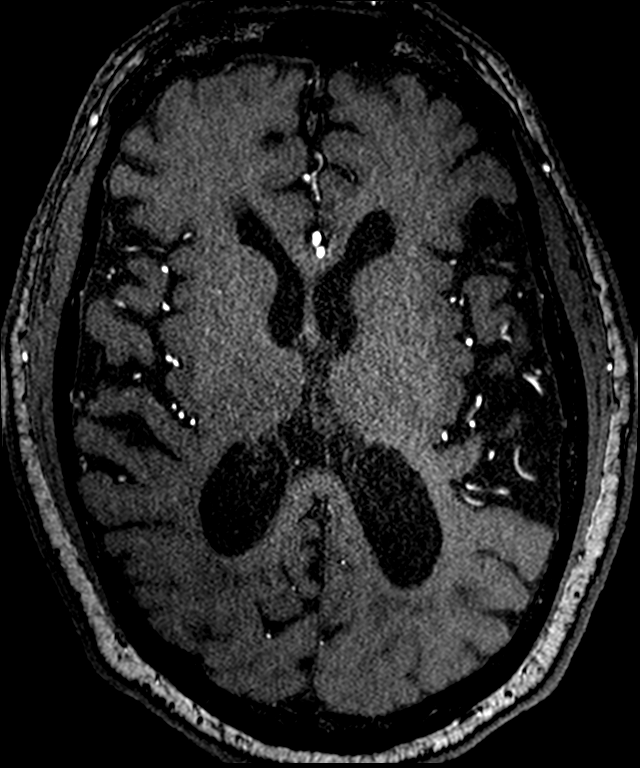

[10 of 48 positions shown; findings below may reference images not displayed]

FINDINGS: MRI HEAD FINDINGS

Brain: No acute infarct, hemorrhage, or mass lesion is present.
Progressive advanced atrophy and confluent white matter disease is
present. There is further volume loss. The ventricles are of
proportionate to the degree of atrophy. No significant extraaxial
fluid collection is present.

The internal auditory canals are within normal limits. White matter
changes extend into the brainstem. Cerebellum is unremarkable.

Vascular: Flow is present in the major intracranial arteries.

Skull and upper cervical spine: Chronic bilateral petrous apex
effusions are present. There is no associated restricted diffusion.
Craniocervical junction is otherwise normal. Upper cervical spine is
within normal limits.

Sinuses/Orbits: Small polyp or mucous retention cyst is again noted
in the posterior left maxillary sinus. The paranasal sinuses are
otherwise clear. Minimal fluid of the left sphenoid sinus is present
without obstructing nasopharyngeal lesion. The globes and orbits are
within normal limits.

MRA HEAD FINDINGS

The internal carotid arteries are within normal limits from the high
cervical segments through the ICA termini bilaterally. The A1 and M1
segments are normal. The anterior communicating artery is patent.
MCA bifurcations are intact. ACA and MCA branch vessels are within
normal limits.

The left vertebral artery is not visualized. Right PICA origin is
visualized and normal. Basilar artery is normal. The right posterior
cerebral artery originates from basilar tip. The left posterior
cerebral artery is of fetal type.
IMPRESSION: 1. No acute or subacute infarct.
2. Progressive age advanced atrophy and white matter disease this
likely reflects the sequela of chronic microvascular ischemia.
3. The left vertebral artery is not visualized on the MRA. It is
likely hypoplastic based on the T2 axial images.
4. Otherwise normal variant MRA circle-of-Willis without significant
proximal stenosis, aneurysm, or branch vessel occlusion.

## 2020-07-17 DIAGNOSIS — G9389 Other specified disorders of brain: Secondary | ICD-10-CM | POA: Diagnosis not present

## 2020-07-17 DIAGNOSIS — I517 Cardiomegaly: Secondary | ICD-10-CM | POA: Diagnosis not present

## 2020-07-17 DIAGNOSIS — I6340 Cerebral infarction due to embolism of unspecified cerebral artery: Secondary | ICD-10-CM | POA: Diagnosis not present

## 2020-07-17 DIAGNOSIS — I10 Essential (primary) hypertension: Secondary | ICD-10-CM | POA: Diagnosis not present

## 2020-07-17 DIAGNOSIS — R4182 Altered mental status, unspecified: Secondary | ICD-10-CM | POA: Diagnosis not present

## 2020-07-17 DIAGNOSIS — R410 Disorientation, unspecified: Secondary | ICD-10-CM | POA: Diagnosis not present

## 2020-07-17 DIAGNOSIS — I4811 Longstanding persistent atrial fibrillation: Secondary | ICD-10-CM | POA: Diagnosis not present

## 2020-07-17 DIAGNOSIS — G319 Degenerative disease of nervous system, unspecified: Secondary | ICD-10-CM | POA: Diagnosis not present

## 2020-07-18 ENCOUNTER — Telehealth: Payer: Self-pay | Admitting: Cardiology

## 2020-07-18 DIAGNOSIS — E538 Deficiency of other specified B group vitamins: Secondary | ICD-10-CM | POA: Diagnosis not present

## 2020-07-18 DIAGNOSIS — I4811 Longstanding persistent atrial fibrillation: Secondary | ICD-10-CM | POA: Diagnosis not present

## 2020-07-18 DIAGNOSIS — E1122 Type 2 diabetes mellitus with diabetic chronic kidney disease: Secondary | ICD-10-CM | POA: Diagnosis not present

## 2020-07-18 DIAGNOSIS — Z7984 Long term (current) use of oral hypoglycemic drugs: Secondary | ICD-10-CM | POA: Diagnosis not present

## 2020-07-18 DIAGNOSIS — I517 Cardiomegaly: Secondary | ICD-10-CM | POA: Diagnosis not present

## 2020-07-18 DIAGNOSIS — G9389 Other specified disorders of brain: Secondary | ICD-10-CM | POA: Diagnosis not present

## 2020-07-18 DIAGNOSIS — I6389 Other cerebral infarction: Secondary | ICD-10-CM | POA: Diagnosis not present

## 2020-07-18 DIAGNOSIS — S066X0A Traumatic subarachnoid hemorrhage without loss of consciousness, initial encounter: Secondary | ICD-10-CM | POA: Diagnosis not present

## 2020-07-18 DIAGNOSIS — R29810 Facial weakness: Secondary | ICD-10-CM | POA: Diagnosis not present

## 2020-07-18 DIAGNOSIS — R29710 NIHSS score 10: Secondary | ICD-10-CM | POA: Diagnosis not present

## 2020-07-18 DIAGNOSIS — I6340 Cerebral infarction due to embolism of unspecified cerebral artery: Secondary | ICD-10-CM | POA: Diagnosis not present

## 2020-07-18 DIAGNOSIS — I10 Essential (primary) hypertension: Secondary | ICD-10-CM | POA: Diagnosis not present

## 2020-07-18 DIAGNOSIS — I252 Old myocardial infarction: Secondary | ICD-10-CM | POA: Diagnosis not present

## 2020-07-18 DIAGNOSIS — Z7902 Long term (current) use of antithrombotics/antiplatelets: Secondary | ICD-10-CM | POA: Diagnosis not present

## 2020-07-18 DIAGNOSIS — I2510 Atherosclerotic heart disease of native coronary artery without angina pectoris: Secondary | ICD-10-CM | POA: Diagnosis not present

## 2020-07-18 DIAGNOSIS — Z951 Presence of aortocoronary bypass graft: Secondary | ICD-10-CM | POA: Diagnosis not present

## 2020-07-18 DIAGNOSIS — I63532 Cerebral infarction due to unspecified occlusion or stenosis of left posterior cerebral artery: Secondary | ICD-10-CM | POA: Diagnosis not present

## 2020-07-18 DIAGNOSIS — I361 Nonrheumatic tricuspid (valve) insufficiency: Secondary | ICD-10-CM | POA: Diagnosis not present

## 2020-07-18 DIAGNOSIS — G8321 Monoplegia of upper limb affecting right dominant side: Secondary | ICD-10-CM | POA: Diagnosis not present

## 2020-07-18 DIAGNOSIS — I340 Nonrheumatic mitral (valve) insufficiency: Secondary | ICD-10-CM | POA: Diagnosis not present

## 2020-07-18 DIAGNOSIS — I672 Cerebral atherosclerosis: Secondary | ICD-10-CM | POA: Diagnosis not present

## 2020-07-18 DIAGNOSIS — I616 Nontraumatic intracerebral hemorrhage, multiple localized: Secondary | ICD-10-CM | POA: Diagnosis not present

## 2020-07-18 DIAGNOSIS — I608 Other nontraumatic subarachnoid hemorrhage: Secondary | ICD-10-CM | POA: Diagnosis not present

## 2020-07-18 DIAGNOSIS — N400 Enlarged prostate without lower urinary tract symptoms: Secondary | ICD-10-CM | POA: Diagnosis not present

## 2020-07-18 DIAGNOSIS — I1310 Hypertensive heart and chronic kidney disease without heart failure, with stage 1 through stage 4 chronic kidney disease, or unspecified chronic kidney disease: Secondary | ICD-10-CM | POA: Diagnosis not present

## 2020-07-18 DIAGNOSIS — G319 Degenerative disease of nervous system, unspecified: Secondary | ICD-10-CM | POA: Diagnosis not present

## 2020-07-18 DIAGNOSIS — I4819 Other persistent atrial fibrillation: Secondary | ICD-10-CM | POA: Diagnosis not present

## 2020-07-18 DIAGNOSIS — E785 Hyperlipidemia, unspecified: Secondary | ICD-10-CM | POA: Diagnosis not present

## 2020-07-18 DIAGNOSIS — I6523 Occlusion and stenosis of bilateral carotid arteries: Secondary | ICD-10-CM | POA: Diagnosis not present

## 2020-07-18 DIAGNOSIS — N183 Chronic kidney disease, stage 3 unspecified: Secondary | ICD-10-CM | POA: Diagnosis not present

## 2020-07-18 DIAGNOSIS — R410 Disorientation, unspecified: Secondary | ICD-10-CM | POA: Diagnosis not present

## 2020-07-18 DIAGNOSIS — R4182 Altered mental status, unspecified: Secondary | ICD-10-CM | POA: Diagnosis not present

## 2020-07-18 DIAGNOSIS — Z91030 Bee allergy status: Secondary | ICD-10-CM | POA: Diagnosis not present

## 2020-07-18 DIAGNOSIS — R4701 Aphasia: Secondary | ICD-10-CM | POA: Diagnosis not present

## 2020-07-18 DIAGNOSIS — Z79899 Other long term (current) drug therapy: Secondary | ICD-10-CM | POA: Diagnosis not present

## 2020-07-18 NOTE — Telephone Encounter (Signed)
Follow Up:     Please call [facility] asap please, she needs to know something asap please.

## 2020-07-18 NOTE — Telephone Encounter (Signed)
Called number provided as well and I'm agreeable it seems to be a fax number.

## 2020-07-18 NOTE — Telephone Encounter (Signed)
Should be on Coreg, not on Toprol

## 2020-07-18 NOTE — Telephone Encounter (Signed)
I spoke with [facility] and gave her information from Dr Agustin Cree

## 2020-07-18 NOTE — Telephone Encounter (Signed)
Dee with [facility] is calling to ensure that the patient is to continue taking carvedilol (COREG) 12.5 MG tablet and metoprolol succinate (TOPROL-XL) 100 MG 24 hr tablet medications. Please return call to discuss at [phone].

## 2020-07-18 NOTE — Telephone Encounter (Signed)
Karena Addison is calling to follow up. She states the incorrect number was provided and the correct call back number is [phone]. Please call.

## 2020-07-18 NOTE — Telephone Encounter (Signed)
I returned call to number listed and it sounds like this is a fax machine.  Tried again and received same fax tone.

## 2020-07-23 DIAGNOSIS — Z951 Presence of aortocoronary bypass graft: Secondary | ICD-10-CM | POA: Diagnosis not present

## 2020-07-23 DIAGNOSIS — I69315 Cognitive social or emotional deficit following cerebral infarction: Secondary | ICD-10-CM | POA: Diagnosis not present

## 2020-07-23 DIAGNOSIS — Z7984 Long term (current) use of oral hypoglycemic drugs: Secondary | ICD-10-CM | POA: Diagnosis not present

## 2020-07-23 DIAGNOSIS — M1990 Unspecified osteoarthritis, unspecified site: Secondary | ICD-10-CM | POA: Diagnosis not present

## 2020-07-23 DIAGNOSIS — I2510 Atherosclerotic heart disease of native coronary artery without angina pectoris: Secondary | ICD-10-CM | POA: Diagnosis not present

## 2020-07-23 DIAGNOSIS — N183 Chronic kidney disease, stage 3 unspecified: Secondary | ICD-10-CM | POA: Diagnosis not present

## 2020-07-23 DIAGNOSIS — I69331 Monoplegia of upper limb following cerebral infarction affecting right dominant side: Secondary | ICD-10-CM | POA: Diagnosis not present

## 2020-07-23 DIAGNOSIS — I083 Combined rheumatic disorders of mitral, aortic and tricuspid valves: Secondary | ICD-10-CM | POA: Diagnosis not present

## 2020-07-23 DIAGNOSIS — E785 Hyperlipidemia, unspecified: Secondary | ICD-10-CM | POA: Diagnosis not present

## 2020-07-23 DIAGNOSIS — I69392 Facial weakness following cerebral infarction: Secondary | ICD-10-CM | POA: Diagnosis not present

## 2020-07-23 DIAGNOSIS — Z9181 History of falling: Secondary | ICD-10-CM | POA: Diagnosis not present

## 2020-07-23 DIAGNOSIS — E1122 Type 2 diabetes mellitus with diabetic chronic kidney disease: Secondary | ICD-10-CM | POA: Diagnosis not present

## 2020-07-23 DIAGNOSIS — N400 Enlarged prostate without lower urinary tract symptoms: Secondary | ICD-10-CM | POA: Diagnosis not present

## 2020-07-23 DIAGNOSIS — I482 Chronic atrial fibrillation, unspecified: Secondary | ICD-10-CM | POA: Diagnosis not present

## 2020-07-23 DIAGNOSIS — I129 Hypertensive chronic kidney disease with stage 1 through stage 4 chronic kidney disease, or unspecified chronic kidney disease: Secondary | ICD-10-CM | POA: Diagnosis not present

## 2020-07-23 DIAGNOSIS — I69320 Aphasia following cerebral infarction: Secondary | ICD-10-CM | POA: Diagnosis not present

## 2020-07-24 DIAGNOSIS — R4701 Aphasia: Secondary | ICD-10-CM | POA: Insufficient documentation

## 2020-07-24 DIAGNOSIS — E538 Deficiency of other specified B group vitamins: Secondary | ICD-10-CM | POA: Insufficient documentation

## 2020-07-24 DIAGNOSIS — R7989 Other specified abnormal findings of blood chemistry: Secondary | ICD-10-CM | POA: Insufficient documentation

## 2020-07-24 DIAGNOSIS — I639 Cerebral infarction, unspecified: Secondary | ICD-10-CM | POA: Insufficient documentation

## 2020-07-24 DIAGNOSIS — G8191 Hemiplegia, unspecified affecting right dominant side: Secondary | ICD-10-CM

## 2020-07-24 HISTORY — DX: Other specified abnormal findings of blood chemistry: R79.89

## 2020-07-24 HISTORY — DX: Deficiency of other specified B group vitamins: E53.8

## 2020-07-24 HISTORY — DX: Hemiplegia, unspecified affecting right dominant side: G81.91

## 2020-07-26 DIAGNOSIS — E538 Deficiency of other specified B group vitamins: Secondary | ICD-10-CM | POA: Diagnosis not present

## 2020-07-26 DIAGNOSIS — E854 Organ-limited amyloidosis: Secondary | ICD-10-CM | POA: Diagnosis not present

## 2020-07-26 DIAGNOSIS — I69151 Hemiplegia and hemiparesis following nontraumatic intracerebral hemorrhage affecting right dominant side: Secondary | ICD-10-CM | POA: Diagnosis not present

## 2020-07-26 DIAGNOSIS — R7989 Other specified abnormal findings of blood chemistry: Secondary | ICD-10-CM | POA: Diagnosis not present

## 2020-07-26 DIAGNOSIS — I639 Cerebral infarction, unspecified: Secondary | ICD-10-CM | POA: Diagnosis not present

## 2020-07-26 DIAGNOSIS — E039 Hypothyroidism, unspecified: Secondary | ICD-10-CM | POA: Diagnosis not present

## 2020-07-26 DIAGNOSIS — R4701 Aphasia: Secondary | ICD-10-CM | POA: Diagnosis not present

## 2020-07-26 DIAGNOSIS — I680 Cerebral amyloid angiopathy: Secondary | ICD-10-CM | POA: Diagnosis not present

## 2020-07-30 DIAGNOSIS — Z8673 Personal history of transient ischemic attack (TIA), and cerebral infarction without residual deficits: Secondary | ICD-10-CM | POA: Diagnosis not present

## 2020-07-30 DIAGNOSIS — R9431 Abnormal electrocardiogram [ECG] [EKG]: Secondary | ICD-10-CM | POA: Diagnosis not present

## 2020-07-30 DIAGNOSIS — E038 Other specified hypothyroidism: Secondary | ICD-10-CM | POA: Insufficient documentation

## 2020-07-30 DIAGNOSIS — I4891 Unspecified atrial fibrillation: Secondary | ICD-10-CM | POA: Diagnosis not present

## 2020-07-30 DIAGNOSIS — I680 Cerebral amyloid angiopathy: Secondary | ICD-10-CM | POA: Diagnosis not present

## 2020-07-30 DIAGNOSIS — Z7982 Long term (current) use of aspirin: Secondary | ICD-10-CM | POA: Diagnosis not present

## 2020-07-30 DIAGNOSIS — I639 Cerebral infarction, unspecified: Secondary | ICD-10-CM | POA: Diagnosis not present

## 2020-07-30 DIAGNOSIS — I619 Nontraumatic intracerebral hemorrhage, unspecified: Secondary | ICD-10-CM | POA: Diagnosis not present

## 2020-07-30 DIAGNOSIS — I10 Essential (primary) hypertension: Secondary | ICD-10-CM | POA: Diagnosis not present

## 2020-07-30 DIAGNOSIS — E039 Hypothyroidism, unspecified: Secondary | ICD-10-CM | POA: Insufficient documentation

## 2020-07-30 DIAGNOSIS — E854 Organ-limited amyloidosis: Secondary | ICD-10-CM | POA: Diagnosis not present

## 2020-07-30 DIAGNOSIS — I493 Ventricular premature depolarization: Secondary | ICD-10-CM | POA: Diagnosis not present

## 2020-07-30 DIAGNOSIS — Z09 Encounter for follow-up examination after completed treatment for conditions other than malignant neoplasm: Secondary | ICD-10-CM | POA: Diagnosis not present

## 2020-07-30 DIAGNOSIS — Z951 Presence of aortocoronary bypass graft: Secondary | ICD-10-CM | POA: Diagnosis not present

## 2020-07-30 DIAGNOSIS — I4821 Permanent atrial fibrillation: Secondary | ICD-10-CM | POA: Diagnosis not present

## 2020-07-30 HISTORY — DX: Other specified hypothyroidism: E03.8

## 2020-07-31 ENCOUNTER — Other Ambulatory Visit: Payer: Self-pay | Admitting: Cardiology

## 2020-07-31 DIAGNOSIS — I69331 Monoplegia of upper limb following cerebral infarction affecting right dominant side: Secondary | ICD-10-CM | POA: Diagnosis not present

## 2020-07-31 DIAGNOSIS — I69315 Cognitive social or emotional deficit following cerebral infarction: Secondary | ICD-10-CM | POA: Diagnosis not present

## 2020-07-31 DIAGNOSIS — I69320 Aphasia following cerebral infarction: Secondary | ICD-10-CM | POA: Diagnosis not present

## 2020-08-06 ENCOUNTER — Encounter: Payer: Self-pay | Admitting: Cardiology

## 2020-08-06 ENCOUNTER — Other Ambulatory Visit: Payer: Self-pay

## 2020-08-06 ENCOUNTER — Ambulatory Visit: Payer: PPO | Admitting: Cardiology

## 2020-08-06 VITALS — BP 140/82 | HR 74 | Ht 73.0 in | Wt 250.0 lb

## 2020-08-06 DIAGNOSIS — Z951 Presence of aortocoronary bypass graft: Secondary | ICD-10-CM | POA: Diagnosis not present

## 2020-08-06 DIAGNOSIS — I629 Nontraumatic intracranial hemorrhage, unspecified: Secondary | ICD-10-CM

## 2020-08-06 DIAGNOSIS — I4821 Permanent atrial fibrillation: Secondary | ICD-10-CM

## 2020-08-06 DIAGNOSIS — I2510 Atherosclerotic heart disease of native coronary artery without angina pectoris: Secondary | ICD-10-CM | POA: Diagnosis not present

## 2020-08-06 DIAGNOSIS — M7989 Other specified soft tissue disorders: Secondary | ICD-10-CM | POA: Diagnosis not present

## 2020-08-06 DIAGNOSIS — E785 Hyperlipidemia, unspecified: Secondary | ICD-10-CM

## 2020-08-06 NOTE — Patient Instructions (Signed)
Medication Instructions:  Your physician recommends that you continue on your current medications as directed. Please refer to the Current Medication list given to you today.  *If you need a refill on your cardiac medications before your next appointment, please call your pharmacy*   Lab Work: None ordered  If you have labs (blood work) drawn today and your tests are completely normal, you will receive your results only by: Marland Kitchen MyChart Message (if you have MyChart) OR . A paper copy in the mail If you have any lab test that is abnormal or we need to change your treatment, we will call you to review the results.   Testing/Procedures: None ordered   Follow-Up: At [facility], you and your health needs are our priority.  As part of our continuing mission to provide you with exceptional heart care, we have created designated Provider Care Teams.  These Care Teams include your primary Cardiologist (physician) and Advanced Practice Providers (APPs -  Physician Assistants and Nurse Practitioners) who all work together to provide you with the care you need, when you need it.  We recommend signing up for the patient portal called "MyChart".  Sign up information is provided on this After Visit Summary.  MyChart is used to connect with patients for Virtual Visits (Telemedicine).  Patients are able to view lab/test results, encounter notes, upcoming appointments, etc.  Non-urgent messages can be sent to your provider as well.   To learn more about what you can do with MyChart, go to [email or web address].    Your next appointment:   3 month(s)  The format for your next appointment:   In Person  Provider:   Jenne Campus, MD   Other Instructions

## 2020-08-06 NOTE — Progress Notes (Signed)
Cardiology Office Note:    Date:  08/06/2020   ID:  Robert Spears, [place] Oct 14, 1951, MRN [id number]  PCP:  Algis Greenhouse, MD  Cardiologist:  Jenne Campus, MD    Referring MD: Algis Greenhouse, MD   Chief Complaint  Patient presents with  . Follow-up  Another stroke  History of Present Illness:    Robert Spears is a 69 y.o. male with past medical history significant for coronary artery disease, status post coronary bypass graft years ago, permanent atrial fibrillation, was on anticoagulation, however end up having intracranial bleed anticoagulation has been withdrawn.  Sadly eventually he ended up getting ischemic stroke.  He comes today to my office for follow-up.  He does have some expressive aphasia.  Also had some generalized weakness but overall getting better according to his wife who is taking care of him.  He did see cardiology team at [facility] and he is scheduled to have watchman device implantation procedure scheduled.  Denies have any cardiac complaints.  There is no chest pain tightness squeezing pressure burning chest there is no shortness of breath, he does get some swelling of lower extremities which is still present.  Past Medical History:  Diagnosis Date  . A-fib ([place])   . Allergy to bee sting   . Diabetes mellitus   . High blood pressure   . High cholesterol   . Stroke ([place])   . Weight loss     Past Surgical History:  Procedure Laterality Date  . ARTERIAL BYPASS SURGRY    . CARDIAC SURGERY    . CORONARY ARTERY BYPASS GRAFT    . CORONARY STENT PLACEMENT      Current Medications: Current Meds  Medication Sig  . aspirin EC 81 MG tablet Take 81 mg by mouth daily.   Marland Kitchen atorvastatin (LIPITOR) 80 MG tablet Take 80 mg by mouth daily.   . carvedilol (COREG) 12.5 MG tablet Take 12.5 mg by mouth 2 (two) times daily.  Marland Kitchen EPINEPHrine (EPIPEN 2-PAK) 0.3 mg/0.3 mL IJ SOAJ injection Use as directed for life-threatening allergic reaction.  Marland Kitchen ezetimibe (ZETIA) 10  MG tablet Take 1 tablet by mouth once daily  . furosemide (LASIX) 40 MG tablet Take 1 tablet (40 mg total) by mouth daily.  Marland Kitchen glimepiride (AMARYL) 2 MG tablet Take 4 mg by mouth daily.   . hydrALAZINE (APRESOLINE) 25 MG tablet Take by mouth.  . metFORMIN (GLUCOPHAGE) 1000 MG tablet Take 1,000 mg by mouth 2 (two) times daily.  . Multiple Vitamin (MULTI-VITAMINS) TABS Take 1 tablet by mouth daily.   . nitroGLYCERIN (NITROSTAT) 0.4 MG SL tablet Place 1 tablet (0.4 mg total) under the tongue every 5 (five) minutes as needed.  Glory Rosebush ULTRA test strip 1 each daily.  . tamsulosin (FLOMAX) 0.4 MG CAPS capsule Take 1 capsule by mouth daily.     Allergies:   Bee venom   Social History   Socioeconomic History  . Marital status: Married    Spouse name: [name]  . Number of children: 2  . Years of education: Not on file  . Highest education level: Associate degree: occupational, Hotel manager, or vocational program  Occupational History  . Occupation: retired  Tobacco Use  . Smoking status: Former Smoker    Quit date: 02/25/1996    Years since quitting: 24.4  . Smokeless tobacco: Former Network engineer  . Vaping Use: Never used  Substance and Sexual Activity  . Alcohol use: No  . Drug use:  No  . Sexual activity: Not on file  Other Topics Concern  . Not on file  Social History Narrative   Patient is left-handed. He lives with his wife in a one level home. He drinks 2 cups of coffee a day and an occasional diet soda. He does not regularly exercise.   Social Determinants of Health   Financial Resource Strain:   . Difficulty of Paying Living Expenses: Not on file  Food Insecurity:   . Worried About Charity fundraiser in the Last Year: Not on file  . Ran Out of Food in the Last Year: Not on file  Transportation Needs:   . Lack of Transportation (Medical): Not on file  . Lack of Transportation (Non-Medical): Not on file  Physical Activity:   . Days of Exercise per Week: Not on file  .  Minutes of Exercise per Session: Not on file  Stress:   . Feeling of Stress : Not on file  Social Connections:   . Frequency of Communication with Friends and Family: Not on file  . Frequency of Social Gatherings with Friends and Family: Not on file  . Attends Religious Services: Not on file  . Active Member of Clubs or Organizations: Not on file  . Attends Archivist Meetings: Not on file  . Marital Status: Not on file     Family History: The patient's family history includes Cancer in his sister; Diabetes in his brother, mother, and sister; Heart attack in his brother; Heart disease in his brother and sister; Heart disease (age of onset: 53) in his mother; High blood pressure in his brother and mother; Other (age of onset: 40) in his father. ROS:   Please see the history of present illness.    All 14 point review of systems negative except as described per history of present illness  EKGs/Labs/Other Studies Reviewed:      Recent Labs: 07/03/2020: BUN 18; Creatinine, Ser 1.65; NT-Pro BNP 2,573; Potassium 4.9; Sodium 139  Recent Lipid Panel No results found for: CHOL, TRIG, HDL, CHOLHDL, VLDL, LDLCALC, LDLDIRECT  Physical Exam:    VS:  BP 140/82 (BP Location: Left Arm, Patient Position: Sitting, Cuff Size: Normal)   Pulse 74   Ht 6\' 1"  (1.854 m)   Wt 250 lb (113.4 kg)   SpO2 99%   BMI 32.98 kg/m     Wt Readings from Last 3 Encounters:  08/06/20 250 lb (113.4 kg)  06/28/20 268 lb (121.6 kg)  05/11/20 259 lb 6.4 oz (117.7 kg)     GEN:  Well nourished, well developed in no acute distress HEENT: Normal NECK: No JVD; No carotid bruits LYMPHATICS: No lymphadenopathy CARDIAC: Irregularly irregular, no murmurs, no rubs, no gallops RESPIRATORY:  Clear to auscultation without rales, wheezing or rhonchi  ABDOMEN: Soft, non-tender, non-distended MUSCULOSKELETAL:  No edema; No deformity  SKIN: Warm and dry LOWER EXTREMITIES: no swelling NEUROLOGIC:  Alert and oriented  x 3 PSYCHIATRIC:  Normal affect   ASSESSMENT:    1. Coronary artery disease involving native coronary artery of native heart without angina pectoris   2. Permanent atrial fibrillation ([place])   3. Intracranial bleeding ([place])   4. Status post coronary artery bypass graft   5. Swelling of both lower extremities   6. Dyslipidemia    PLAN:    In order of problems listed above:  1. Coronary disease stable from that point review on antiplatelet therapy as well as beta-blocker which I will continue.  He  is also on high intense statin in form of Lipitor 80 mg we will continue present management. 2. Permanent atrial fibrillation: Rate controlled, he is not anticoagulated because of history of intracranial bleed, recent stroke.  He is scheduled to have watchman device implantation procedure.  I discussed with patient as well as his wife procedure explained to him what the rationale for it is they understood already they were very well informed by cardiologist from [facility]. 3. History of intracranial bleed.  Noted. 4. Status post coronary bypass graft stable. 5. Swelling of lower extremities still present but only mild today. 6. Dyslipidemia: High intensity statin.  I will continue.  He is also on Zetia   Medication Adjustments/Labs and Tests Ordered: Current medicines are reviewed at length with the patient today.  Concerns regarding medicines are outlined above.  No orders of the defined types were placed in this encounter.  Medication changes: No orders of the defined types were placed in this encounter.   Signed, Park Liter, MD, [facility] 08/06/2020 10:20 AM    [place]

## 2020-08-10 DIAGNOSIS — E118 Type 2 diabetes mellitus with unspecified complications: Secondary | ICD-10-CM | POA: Diagnosis not present

## 2020-08-10 DIAGNOSIS — I619 Nontraumatic intracerebral hemorrhage, unspecified: Secondary | ICD-10-CM | POA: Diagnosis not present

## 2020-08-10 DIAGNOSIS — Z794 Long term (current) use of insulin: Secondary | ICD-10-CM | POA: Diagnosis not present

## 2020-08-10 DIAGNOSIS — I10 Essential (primary) hypertension: Secondary | ICD-10-CM | POA: Diagnosis not present

## 2020-08-10 DIAGNOSIS — E854 Organ-limited amyloidosis: Secondary | ICD-10-CM | POA: Diagnosis not present

## 2020-08-10 DIAGNOSIS — Z8673 Personal history of transient ischemic attack (TIA), and cerebral infarction without residual deficits: Secondary | ICD-10-CM | POA: Diagnosis not present

## 2020-08-10 DIAGNOSIS — I4821 Permanent atrial fibrillation: Secondary | ICD-10-CM | POA: Diagnosis not present

## 2020-08-10 DIAGNOSIS — Z87891 Personal history of nicotine dependence: Secondary | ICD-10-CM | POA: Diagnosis not present

## 2020-08-10 DIAGNOSIS — E785 Hyperlipidemia, unspecified: Secondary | ICD-10-CM | POA: Diagnosis not present

## 2020-08-10 DIAGNOSIS — E782 Mixed hyperlipidemia: Secondary | ICD-10-CM | POA: Diagnosis not present

## 2020-08-10 DIAGNOSIS — I4891 Unspecified atrial fibrillation: Secondary | ICD-10-CM | POA: Diagnosis not present

## 2020-08-10 DIAGNOSIS — I680 Cerebral amyloid angiopathy: Secondary | ICD-10-CM | POA: Diagnosis not present

## 2020-08-16 DIAGNOSIS — I619 Nontraumatic intracerebral hemorrhage, unspecified: Secondary | ICD-10-CM | POA: Diagnosis not present

## 2020-08-21 ENCOUNTER — Telehealth: Payer: Self-pay | Admitting: Cardiology

## 2020-08-21 NOTE — Telephone Encounter (Signed)
° ° °  Pt c/o medication issue:  1. Name of Medication: amlodipine  2. How are you currently taking this medication (dosage and times per day)?   3. Are you having a reaction (difficulty breathing--STAT)?   4. What is your medication issue? Mardene Celeste calling, she said the last time pt was seen by Dr. Raliegh Ip he decreases his amlodipine from 2 tablet to 1 table a day. She tried to refill this medication and was told by pharmacy it has been discontinued, she would like to clarify if pt need to stop taking this med

## 2020-08-21 NOTE — Telephone Encounter (Signed)
Interestingly, amlodipine is noted with listed on the list of medication he was taking last time.

## 2020-08-22 DIAGNOSIS — N183 Chronic kidney disease, stage 3 unspecified: Secondary | ICD-10-CM | POA: Diagnosis not present

## 2020-08-22 DIAGNOSIS — Z951 Presence of aortocoronary bypass graft: Secondary | ICD-10-CM | POA: Diagnosis not present

## 2020-08-22 DIAGNOSIS — I69320 Aphasia following cerebral infarction: Secondary | ICD-10-CM | POA: Diagnosis not present

## 2020-08-22 DIAGNOSIS — I129 Hypertensive chronic kidney disease with stage 1 through stage 4 chronic kidney disease, or unspecified chronic kidney disease: Secondary | ICD-10-CM | POA: Diagnosis not present

## 2020-08-22 DIAGNOSIS — I69315 Cognitive social or emotional deficit following cerebral infarction: Secondary | ICD-10-CM | POA: Diagnosis not present

## 2020-08-22 DIAGNOSIS — I482 Chronic atrial fibrillation, unspecified: Secondary | ICD-10-CM | POA: Diagnosis not present

## 2020-08-22 DIAGNOSIS — E785 Hyperlipidemia, unspecified: Secondary | ICD-10-CM | POA: Diagnosis not present

## 2020-08-22 DIAGNOSIS — M1990 Unspecified osteoarthritis, unspecified site: Secondary | ICD-10-CM | POA: Diagnosis not present

## 2020-08-22 DIAGNOSIS — Z9181 History of falling: Secondary | ICD-10-CM | POA: Diagnosis not present

## 2020-08-22 DIAGNOSIS — I69331 Monoplegia of upper limb following cerebral infarction affecting right dominant side: Secondary | ICD-10-CM | POA: Diagnosis not present

## 2020-08-22 DIAGNOSIS — I2510 Atherosclerotic heart disease of native coronary artery without angina pectoris: Secondary | ICD-10-CM | POA: Diagnosis not present

## 2020-08-22 DIAGNOSIS — N400 Enlarged prostate without lower urinary tract symptoms: Secondary | ICD-10-CM | POA: Diagnosis not present

## 2020-08-22 DIAGNOSIS — Z7984 Long term (current) use of oral hypoglycemic drugs: Secondary | ICD-10-CM | POA: Diagnosis not present

## 2020-08-22 DIAGNOSIS — I083 Combined rheumatic disorders of mitral, aortic and tricuspid valves: Secondary | ICD-10-CM | POA: Diagnosis not present

## 2020-08-22 DIAGNOSIS — I69392 Facial weakness following cerebral infarction: Secondary | ICD-10-CM | POA: Diagnosis not present

## 2020-08-22 DIAGNOSIS — E1122 Type 2 diabetes mellitus with diabetic chronic kidney disease: Secondary | ICD-10-CM | POA: Diagnosis not present

## 2020-08-22 MED ORDER — AMLODIPINE BESYLATE 10 MG PO TABS
10.0000 mg | ORAL_TABLET | Freq: Every day | ORAL | 1 refills | Status: DC
Start: 2020-08-22 — End: 2020-12-24

## 2020-08-22 NOTE — Telephone Encounter (Signed)
One of the biggest problem he had was swelling of lower extremities, if he does have significant swelling then amlodipine can be reduced to only 5 mg daily, if swelling is not a problem it looks like his blood pressure is well controlled I will continue with 10 mg

## 2020-08-22 NOTE — Telephone Encounter (Signed)
Called and spoke to patient wife again. Swelling is not an issue at this time. Will continue 10 mg daily. If swelling comes back they will let us know and we will decrease to 5 mg daily of amlodipine.

## 2020-08-22 NOTE — Telephone Encounter (Signed)
Called and spoke to patient's wife per dpr. She reports that the patient has been taking Amlodipine 10 mg daily since July. He has been in the hospital twice since then so we are thinking that is where the medication mix up has possibly cam from. Regardless the patient has not stopped amlodipine. His blood pressure today is 125/68. Will consult if he is ok for [place] to send in amlodipine 10 mg daily for the patient as he has been taking.

## 2020-09-03 DIAGNOSIS — I611 Nontraumatic intracerebral hemorrhage in hemisphere, cortical: Secondary | ICD-10-CM | POA: Diagnosis not present

## 2020-09-03 DIAGNOSIS — I2510 Atherosclerotic heart disease of native coronary artery without angina pectoris: Secondary | ICD-10-CM | POA: Diagnosis not present

## 2020-09-03 DIAGNOSIS — Z951 Presence of aortocoronary bypass graft: Secondary | ICD-10-CM | POA: Diagnosis not present

## 2020-09-03 DIAGNOSIS — E785 Hyperlipidemia, unspecified: Secondary | ICD-10-CM | POA: Diagnosis not present

## 2020-09-03 DIAGNOSIS — E119 Type 2 diabetes mellitus without complications: Secondary | ICD-10-CM | POA: Diagnosis not present

## 2020-09-03 DIAGNOSIS — I10 Essential (primary) hypertension: Secondary | ICD-10-CM | POA: Diagnosis not present

## 2020-09-03 DIAGNOSIS — Z87891 Personal history of nicotine dependence: Secondary | ICD-10-CM | POA: Diagnosis not present

## 2020-09-21 ENCOUNTER — Telehealth: Payer: Self-pay | Admitting: Cardiology

## 2020-09-21 DIAGNOSIS — M1990 Unspecified osteoarthritis, unspecified site: Secondary | ICD-10-CM | POA: Diagnosis not present

## 2020-09-21 DIAGNOSIS — E785 Hyperlipidemia, unspecified: Secondary | ICD-10-CM | POA: Diagnosis not present

## 2020-09-21 DIAGNOSIS — I2510 Atherosclerotic heart disease of native coronary artery without angina pectoris: Secondary | ICD-10-CM | POA: Diagnosis not present

## 2020-09-21 DIAGNOSIS — I69315 Cognitive social or emotional deficit following cerebral infarction: Secondary | ICD-10-CM | POA: Diagnosis not present

## 2020-09-21 DIAGNOSIS — I482 Chronic atrial fibrillation, unspecified: Secondary | ICD-10-CM | POA: Diagnosis not present

## 2020-09-21 DIAGNOSIS — N183 Chronic kidney disease, stage 3 unspecified: Secondary | ICD-10-CM | POA: Diagnosis not present

## 2020-09-21 DIAGNOSIS — E1122 Type 2 diabetes mellitus with diabetic chronic kidney disease: Secondary | ICD-10-CM | POA: Diagnosis not present

## 2020-09-21 DIAGNOSIS — Z951 Presence of aortocoronary bypass graft: Secondary | ICD-10-CM | POA: Diagnosis not present

## 2020-09-21 DIAGNOSIS — N400 Enlarged prostate without lower urinary tract symptoms: Secondary | ICD-10-CM | POA: Diagnosis not present

## 2020-09-21 DIAGNOSIS — I69392 Facial weakness following cerebral infarction: Secondary | ICD-10-CM | POA: Diagnosis not present

## 2020-09-21 DIAGNOSIS — I69320 Aphasia following cerebral infarction: Secondary | ICD-10-CM | POA: Diagnosis not present

## 2020-09-21 DIAGNOSIS — Z9181 History of falling: Secondary | ICD-10-CM | POA: Diagnosis not present

## 2020-09-21 DIAGNOSIS — I69331 Monoplegia of upper limb following cerebral infarction affecting right dominant side: Secondary | ICD-10-CM | POA: Diagnosis not present

## 2020-09-21 DIAGNOSIS — I083 Combined rheumatic disorders of mitral, aortic and tricuspid valves: Secondary | ICD-10-CM | POA: Diagnosis not present

## 2020-09-21 DIAGNOSIS — Z7984 Long term (current) use of oral hypoglycemic drugs: Secondary | ICD-10-CM | POA: Diagnosis not present

## 2020-09-21 NOTE — Telephone Encounter (Signed)
Becky from [facility] states she faxed a shared decision paperwork in regards to the referral sent to them. She states she has not heard anything back and is faxing it again.

## 2020-09-21 NOTE — Telephone Encounter (Signed)
Attempted to call number back. It needed a extension. No extension provided. Unsure what this call is referring to.

## 2020-09-24 ENCOUNTER — Other Ambulatory Visit: Payer: Self-pay | Admitting: Cardiology

## 2020-09-24 MED ORDER — CARVEDILOL 12.5 MG PO TABS: 12.5000 mg | ORAL_TABLET | Freq: Two times a day (BID) | ORAL | 1 refills | Status: DC

## 2020-09-24 MED ORDER — HYDRALAZINE HCL 25 MG PO TABS: 25.0000 mg | ORAL_TABLET | Freq: Every day | ORAL | 1 refills | Status: DC

## 2020-09-24 NOTE — Telephone Encounter (Signed)
New Message  Pt c/o medication issue:  1. Name of Medication:  carvedilol (COREG) 12.5 MG tablet hydrALAZINE (APRESOLINE) 25 MG tablet tamsulosin (FLOMAX) 0.4 MG CAPS capsule  2. How are you currently taking this medication (dosage and times per day)?  carvedilol (COREG) 12.5 MG tablet 2x daily  hydrALAZINE (APRESOLINE) 25 MG tablet 1x daily  tamsulosin (FLOMAX) 0.4 MG CAPS capsule 1 x daily   3. Are you having a reaction (difficulty breathing--STAT)? No   4. What is your medication issue? Pts wife is calling, she says the patient was prescribed this medication when she was in the hospital at [facility]. She says he only has a few tablets left and if he needs to continue taking them, he will need refills sent too [place], [place] with 30 day supply   Please call

## 2020-09-24 NOTE — Telephone Encounter (Signed)
Refilled carvedilol and hydralazine. Called wife per dpr informed her flomax will need to come from pcp. She verbally understood. No further questions.

## 2020-09-28 DIAGNOSIS — I69315 Cognitive social or emotional deficit following cerebral infarction: Secondary | ICD-10-CM | POA: Diagnosis not present

## 2020-09-28 DIAGNOSIS — I69320 Aphasia following cerebral infarction: Secondary | ICD-10-CM | POA: Diagnosis not present

## 2020-09-28 DIAGNOSIS — I4821 Permanent atrial fibrillation: Secondary | ICD-10-CM | POA: Diagnosis not present

## 2020-09-28 DIAGNOSIS — E119 Type 2 diabetes mellitus without complications: Secondary | ICD-10-CM | POA: Diagnosis not present

## 2020-09-28 DIAGNOSIS — Z01812 Encounter for preprocedural laboratory examination: Secondary | ICD-10-CM | POA: Diagnosis not present

## 2020-09-28 DIAGNOSIS — I69331 Monoplegia of upper limb following cerebral infarction affecting right dominant side: Secondary | ICD-10-CM | POA: Diagnosis not present

## 2020-10-01 ENCOUNTER — Telehealth: Payer: Self-pay

## 2020-10-01 NOTE — Telephone Encounter (Signed)
Robert Spears from [facility] calling in to check on a shared decision form she faxed in last week. Saw where Robert Spears attempted to call - obtained direct contact number for [facility]. [phone]. Please call.

## 2020-10-01 NOTE — Telephone Encounter (Signed)
      I went in pt's chart to see who called him this morning. It was Robert Spears.

## 2020-10-01 NOTE — Telephone Encounter (Signed)
LMTCB for [employer]

## 2020-10-02 ENCOUNTER — Telehealth: Payer: Self-pay | Admitting: *Deleted

## 2020-10-02 ENCOUNTER — Telehealth: Payer: Self-pay | Admitting: Cardiology

## 2020-10-02 DIAGNOSIS — Z79899 Other long term (current) drug therapy: Secondary | ICD-10-CM

## 2020-10-02 NOTE — Telephone Encounter (Signed)
Pt c/o medication issue:  1. Name of Medication: losartan 100 mg  2. How are you currently taking this medication (dosage and times per day)? Stopped taking since July  3. Are you having a reaction (difficulty breathing--STAT)? no  4. What is your medication issue? Robert Spears from [facility] calling to verify if the patient is supposed to be taking the losartan. She states he was at [facility] for a fall and they stopped the medication. She states his pharmacy automatically refilled the medication, but he stopped it back in July.

## 2020-10-02 NOTE — Telephone Encounter (Signed)
Faxed form back to [facility] in [place] for pt to have Left Atrial Appendage Closure with Non-Valvular Atrial Fibrillation. Form was signed by Dr. Agustin Cree.

## 2020-10-04 NOTE — Telephone Encounter (Signed)
Spoke to patient wife per dpr. Advised her that he needs to have labs drawn to determine further. She verbally understood no further questions.

## 2020-10-04 NOTE — Telephone Encounter (Signed)
Lets check his Chem-7 that will help [place] to determine if he can be on this medication.  Ideally he should be but again lets check Chem-7 first

## 2020-10-05 DIAGNOSIS — I129 Hypertensive chronic kidney disease with stage 1 through stage 4 chronic kidney disease, or unspecified chronic kidney disease: Secondary | ICD-10-CM | POA: Diagnosis not present

## 2020-10-05 DIAGNOSIS — I493 Ventricular premature depolarization: Secondary | ICD-10-CM | POA: Diagnosis not present

## 2020-10-05 DIAGNOSIS — I4821 Permanent atrial fibrillation: Secondary | ICD-10-CM | POA: Diagnosis not present

## 2020-10-05 DIAGNOSIS — I2510 Atherosclerotic heart disease of native coronary artery without angina pectoris: Secondary | ICD-10-CM | POA: Diagnosis not present

## 2020-10-05 DIAGNOSIS — Z7902 Long term (current) use of antithrombotics/antiplatelets: Secondary | ICD-10-CM | POA: Diagnosis not present

## 2020-10-05 DIAGNOSIS — I680 Cerebral amyloid angiopathy: Secondary | ICD-10-CM | POA: Diagnosis not present

## 2020-10-05 DIAGNOSIS — E782 Mixed hyperlipidemia: Secondary | ICD-10-CM | POA: Diagnosis not present

## 2020-10-05 DIAGNOSIS — I69351 Hemiplegia and hemiparesis following cerebral infarction affecting right dominant side: Secondary | ICD-10-CM | POA: Diagnosis not present

## 2020-10-05 DIAGNOSIS — Z006 Encounter for examination for normal comparison and control in clinical research program: Secondary | ICD-10-CM | POA: Diagnosis not present

## 2020-10-05 DIAGNOSIS — I510 Cardiac septal defect, acquired: Secondary | ICD-10-CM | POA: Diagnosis not present

## 2020-10-05 DIAGNOSIS — Z7982 Long term (current) use of aspirin: Secondary | ICD-10-CM | POA: Diagnosis not present

## 2020-10-05 DIAGNOSIS — E1122 Type 2 diabetes mellitus with diabetic chronic kidney disease: Secondary | ICD-10-CM | POA: Diagnosis not present

## 2020-10-05 DIAGNOSIS — N183 Chronic kidney disease, stage 3 unspecified: Secondary | ICD-10-CM | POA: Diagnosis not present

## 2020-10-05 DIAGNOSIS — Z87891 Personal history of nicotine dependence: Secondary | ICD-10-CM | POA: Diagnosis not present

## 2020-10-05 DIAGNOSIS — E854 Organ-limited amyloidosis: Secondary | ICD-10-CM | POA: Diagnosis not present

## 2020-10-05 DIAGNOSIS — I4891 Unspecified atrial fibrillation: Secondary | ICD-10-CM | POA: Diagnosis not present

## 2020-10-05 DIAGNOSIS — Z7984 Long term (current) use of oral hypoglycemic drugs: Secondary | ICD-10-CM | POA: Diagnosis not present

## 2020-10-05 DIAGNOSIS — I69320 Aphasia following cerebral infarction: Secondary | ICD-10-CM | POA: Diagnosis not present

## 2020-10-05 DIAGNOSIS — E038 Other specified hypothyroidism: Secondary | ICD-10-CM | POA: Diagnosis not present

## 2020-10-05 DIAGNOSIS — Z951 Presence of aortocoronary bypass graft: Secondary | ICD-10-CM | POA: Diagnosis not present

## 2020-10-05 DIAGNOSIS — Z95818 Presence of other cardiac implants and grafts: Secondary | ICD-10-CM | POA: Diagnosis not present

## 2020-10-05 DIAGNOSIS — Z79899 Other long term (current) drug therapy: Secondary | ICD-10-CM | POA: Diagnosis not present

## 2020-10-06 DIAGNOSIS — Z95818 Presence of other cardiac implants and grafts: Secondary | ICD-10-CM

## 2020-10-06 HISTORY — DX: Presence of other cardiac implants and grafts: Z95.818

## 2020-10-09 DIAGNOSIS — R7989 Other specified abnormal findings of blood chemistry: Secondary | ICD-10-CM | POA: Diagnosis not present

## 2020-10-09 DIAGNOSIS — Z23 Encounter for immunization: Secondary | ICD-10-CM | POA: Diagnosis not present

## 2020-10-09 DIAGNOSIS — Z0000 Encounter for general adult medical examination without abnormal findings: Secondary | ICD-10-CM | POA: Diagnosis not present

## 2020-10-09 DIAGNOSIS — E782 Mixed hyperlipidemia: Secondary | ICD-10-CM | POA: Diagnosis not present

## 2020-10-09 DIAGNOSIS — Z87891 Personal history of nicotine dependence: Secondary | ICD-10-CM | POA: Diagnosis not present

## 2020-10-09 DIAGNOSIS — E119 Type 2 diabetes mellitus without complications: Secondary | ICD-10-CM | POA: Diagnosis not present

## 2020-10-09 DIAGNOSIS — E038 Other specified hypothyroidism: Secondary | ICD-10-CM | POA: Diagnosis not present

## 2020-10-09 DIAGNOSIS — I10 Essential (primary) hypertension: Secondary | ICD-10-CM | POA: Diagnosis not present

## 2020-10-09 HISTORY — PX: LEFT ATRIAL APPENDAGE OCCLUSION: SHX173A

## 2020-10-09 NOTE — Telephone Encounter (Signed)
Called and spoke to [place]. No further needs at this time.

## 2020-10-26 ENCOUNTER — Other Ambulatory Visit: Payer: Self-pay | Admitting: Cardiology

## 2020-10-29 DIAGNOSIS — R4701 Aphasia: Secondary | ICD-10-CM | POA: Diagnosis not present

## 2020-10-30 ENCOUNTER — Ambulatory Visit: Payer: PPO | Admitting: Cardiology

## 2020-10-30 ENCOUNTER — Other Ambulatory Visit: Payer: Self-pay

## 2020-10-30 ENCOUNTER — Encounter: Payer: Self-pay | Admitting: Cardiology

## 2020-10-30 VITALS — BP 142/76 | HR 68 | Wt 251.0 lb

## 2020-10-30 DIAGNOSIS — E119 Type 2 diabetes mellitus without complications: Secondary | ICD-10-CM | POA: Diagnosis not present

## 2020-10-30 DIAGNOSIS — Z95818 Presence of other cardiac implants and grafts: Secondary | ICD-10-CM | POA: Diagnosis not present

## 2020-10-30 DIAGNOSIS — I2510 Atherosclerotic heart disease of native coronary artery without angina pectoris: Secondary | ICD-10-CM

## 2020-10-30 DIAGNOSIS — I4821 Permanent atrial fibrillation: Secondary | ICD-10-CM | POA: Diagnosis not present

## 2020-10-30 DIAGNOSIS — E782 Mixed hyperlipidemia: Secondary | ICD-10-CM

## 2020-10-30 DIAGNOSIS — M7989 Other specified soft tissue disorders: Secondary | ICD-10-CM

## 2020-10-30 NOTE — Progress Notes (Signed)
Cardiology Office Note:    Date:  10/30/2020   ID:  Robert Spears, DOB [birth year], MRN [id number]  PCP:  Algis Greenhouse, MD  Cardiologist:  Jenne Campus, MD    Referring MD: Algis Greenhouse, MD   No chief complaint on file. I am doing better  History of Present Illness:    Robert Spears is a 69 y.o. male with past medical history significant for coronary artery disease, status post coronary bypass graft many years ago, permanent atrial fibrillation, he was not anticoagulated however end up having intracranial bleed, anticoagulation has been withdrawn sadly eventually he ended up having ischemic stroke being off anticoagulation.  He developed expressive aphasia.  He is doing better in terms of aphasia he is participating in speech therapy and improvement is noted post however still according to wife is difficult for him to talk.  About 3 weeks ago he did have watchman device inserted that was done at [facility].  Doing very well from that point review.  He said he is building up stamina he is getting stronger and is able to do better.  Past Medical History:  Diagnosis Date  . A-fib ([place])   . Allergy to bee sting   . Diabetes mellitus   . High blood pressure   . High cholesterol   . Stroke ([place])   . Weight loss     Past Surgical History:  Procedure Laterality Date  . ARTERIAL BYPASS SURGRY    . CARDIAC SURGERY    . CORONARY ARTERY BYPASS GRAFT    . CORONARY STENT PLACEMENT    . LEFT ATRIAL APPENDAGE OCCLUSION  10/09/2020    Current Medications: Current Meds  Medication Sig  . amLODipine (NORVASC) 10 MG tablet Take 1 tablet (10 mg total) by mouth daily.  Marland Kitchen aspirin EC 81 MG tablet Take 81 mg by mouth daily.   Marland Kitchen atorvastatin (LIPITOR) 80 MG tablet Take 80 mg by mouth daily.   . carvedilol (COREG) 12.5 MG tablet Take 1 tablet (12.5 mg total) by mouth 2 (two) times daily.  Marland Kitchen EPINEPHrine (EPIPEN 2-PAK) 0.3 mg/0.3 mL IJ SOAJ injection Use as directed for life-threatening  allergic reaction.  Marland Kitchen ezetimibe (ZETIA) 10 MG tablet Take 1 tablet by mouth once daily  . furosemide (LASIX) 40 MG tablet Take 1 tablet (40 mg total) by mouth daily.  Marland Kitchen glimepiride (AMARYL) 2 MG tablet Take 4 mg by mouth daily.   . hydrALAZINE (APRESOLINE) 25 MG tablet Take 1 tablet (25 mg total) by mouth daily.  Marland Kitchen levothyroxine (SYNTHROID) 25 MCG tablet Take 25 mcg by mouth daily before breakfast.  . metFORMIN (GLUCOPHAGE) 1000 MG tablet Take 1,000 mg by mouth 2 (two) times daily.  . Multiple Vitamin (MULTI-VITAMINS) TABS Take 1 tablet by mouth daily.   . nitroGLYCERIN (NITROSTAT) 0.4 MG SL tablet Place 1 tablet (0.4 mg total) under the tongue every 5 (five) minutes as needed.     Allergies:   Bee venom   Social History   Socioeconomic History  . Marital status: Married    Spouse name: Robert Spears  . Number of children: 2  . Years of education: Not on file  . Highest education level: Associate degree: occupational, Hotel manager, or vocational program  Occupational History  . Occupation: retired  Tobacco Use  . Smoking status: Former Smoker    Quit date: 02/25/1996    Years since quitting: 24.6  . Smokeless tobacco: Former Network engineer  . Vaping Use: Never used  Substance and Sexual Activity  . Alcohol use: No  . Drug use: No  . Sexual activity: Not on file  Other Topics Concern  . Not on file  Social History Narrative   Patient is left-handed. He lives with his wife in a one level home. He drinks 2 cups of coffee a day and an occasional diet soda. He does not regularly exercise.   Social Determinants of Health   Financial Resource Strain:   . Difficulty of Paying Living Expenses: Not on file  Food Insecurity:   . Worried About Charity fundraiser in the Last Year: Not on file  . Ran Out of Food in the Last Year: Not on file  Transportation Needs:   . Lack of Transportation (Medical): Not on file  . Lack of Transportation (Non-Medical): Not on file  Physical Activity:   .  Days of Exercise per Week: Not on file  . Minutes of Exercise per Session: Not on file  Stress:   . Feeling of Stress : Not on file  Social Connections:   . Frequency of Communication with Friends and Family: Not on file  . Frequency of Social Gatherings with Friends and Family: Not on file  . Attends Religious Services: Not on file  . Active Member of Clubs or Organizations: Not on file  . Attends Archivist Meetings: Not on file  . Marital Status: Not on file     Family History: The patient's family history includes Cancer in his sister; Diabetes in his brother, mother, and sister; Heart attack in his brother; Heart disease in his brother and sister; Heart disease (age of onset: 39) in his mother; High blood pressure in his brother and mother; Other (age of onset: 24) in his father. ROS:   Please see the history of present illness.    All 14 point review of systems negative except as described per history of present illness  EKGs/Labs/Other Studies Reviewed:      Recent Labs: 07/03/2020: BUN 18; Creatinine, Ser 1.65; NT-Pro BNP 2,573; Potassium 4.9; Sodium 139  Recent Lipid Panel No results found for: CHOL, TRIG, HDL, CHOLHDL, VLDL, LDLCALC, LDLDIRECT  Physical Exam:    VS:  BP (!) 142/76 (BP Location: Left Arm, Patient Position: Sitting)   Pulse 68   Wt 251 lb (113.9 kg)   SpO2 99%   BMI 33.12 kg/m     Wt Readings from Last 3 Encounters:  10/30/20 251 lb (113.9 kg)  08/06/20 250 lb (113.4 kg)  06/28/20 268 lb (121.6 kg)     GEN:  Well nourished, well developed in no acute distress HEENT: Normal NECK: No JVD; No carotid bruits LYMPHATICS: No lymphadenopathy CARDIAC: Irregular irregular, no murmurs, no rubs, no gallops RESPIRATORY:  Clear to auscultation without rales, wheezing or rhonchi  ABDOMEN: Soft, non-tender, non-distended MUSCULOSKELETAL:  No edema; No deformity  SKIN: Warm and dry LOWER EXTREMITIES: no swelling NEUROLOGIC:  Alert and oriented x  3 PSYCHIATRIC:  Normal affect   ASSESSMENT:    1. Permanent atrial fibrillation ([place])   2. Coronary artery disease involving native coronary artery of native heart without angina pectoris   3. Type 2 diabetes mellitus without complication, without long-term current use of insulin ([place])   4. Presence of Watchman left atrial appendage closure device   5. Swelling of both lower extremities   6. Mixed hyperlipidemia    PLAN:    In order of problems listed above:  1. Permanent atrial fibrillation, rate is  controlled, not anticoagulated because of history of intracranial bleed while on anticoagulation.  He is on aspirin and Plavix for 6 months after watchman device insertion.  Procedure was done in October 2021. 2. Coronary disease, status post coronary artery bypass graft, stable on appropriate medication asymptomatic we will continue. 3. Essential hypertension: Uncontrolled: I did review his medication and noted that he is taking hydralazine only 1 tablet a day.  I asked him to increase to twice daily and I told him that the ultimate target will be to take that medication 3 times a day.  He is willing to try twice daily first. 4. Kidney dysfunction with creatinine 1.91, he is scheduled to see nephrologist this coming Thursday. 5. Presence of watchman device, implanted few weeks ago at [facility]  I did review notes.  Procedure was uncomplicated and again plan is to continue dual antiplatelet therapy for 6 months. 6. Dyslipidemia: I did review his test done by primary care physician which showed total cholesterol 117 LDL 75, HDL 29, this is on high intensity statin.  He is also taking Zetia.  We did talk about diet in the future we may be forced to upgrade to the therapy to PCSK9 agent.  His target LDL should be less than 70   Medication Adjustments/Labs and Tests Ordered: Current medicines are reviewed at length with the patient today.  Concerns regarding medicines are outlined above.  No orders  of the defined types were placed in this encounter.  Medication changes: No orders of the defined types were placed in this encounter.   Signed, Park Liter, MD, [facility] 10/30/2020 9:43 AM    [place]

## 2020-10-30 NOTE — Patient Instructions (Signed)
Medication Instructions:  Your physician recommends that you continue on your current medications as directed. Please refer to the Current Medication list given to you today.  *If you need a refill on your cardiac medications before your next appointment, please call your pharmacy*   Lab Work: None If you have labs (blood work) drawn today and your tests are completely normal, you will receive your results only by: Marland Kitchen MyChart Message (if you have MyChart) OR . A paper copy in the mail If you have any lab test that is abnormal or we need to change your treatment, we will call you to review the results.   Testing/Procedures: none   Follow-Up: At [facility], you and your health needs are our priority.  As part of our continuing mission to provide you with exceptional heart care, we have created designated Provider Care Teams.  These Care Teams include your primary Cardiologist (physician) and Advanced Practice Providers (APPs -  Physician Assistants and Nurse Practitioners) who all work together to provide you with the care you need, when you need it.  We recommend signing up for the patient portal called "MyChart".  Sign up information is provided on this After Visit Summary.  MyChart is used to connect with patients for Virtual Visits (Telemedicine).  Patients are able to view lab/test results, encounter notes, upcoming appointments, etc.  Non-urgent messages can be sent to your provider as well.   To learn more about what you can do with MyChart, go to [email or web address].    Your next appointment:   5 month(s)  The format for your next appointment:   In Person  Provider:   Jenne Campus, MD   Other Instructions

## 2020-11-01 DIAGNOSIS — N183 Chronic kidney disease, stage 3 unspecified: Secondary | ICD-10-CM | POA: Diagnosis not present

## 2020-11-01 DIAGNOSIS — E1122 Type 2 diabetes mellitus with diabetic chronic kidney disease: Secondary | ICD-10-CM | POA: Diagnosis not present

## 2020-11-01 DIAGNOSIS — R809 Proteinuria, unspecified: Secondary | ICD-10-CM

## 2020-11-01 DIAGNOSIS — E669 Obesity, unspecified: Secondary | ICD-10-CM

## 2020-11-01 DIAGNOSIS — I129 Hypertensive chronic kidney disease with stage 1 through stage 4 chronic kidney disease, or unspecified chronic kidney disease: Secondary | ICD-10-CM | POA: Diagnosis not present

## 2020-11-01 DIAGNOSIS — N1832 Chronic kidney disease, stage 3b: Secondary | ICD-10-CM | POA: Diagnosis not present

## 2020-11-01 HISTORY — DX: Obesity, unspecified: E66.9

## 2020-11-01 HISTORY — DX: Proteinuria, unspecified: R80.9

## 2020-11-05 DIAGNOSIS — J042 Acute laryngotracheitis: Secondary | ICD-10-CM

## 2020-11-05 HISTORY — DX: Acute laryngotracheitis: J04.2

## 2020-11-13 ENCOUNTER — Other Ambulatory Visit: Payer: Self-pay | Admitting: Cardiology

## 2020-11-13 NOTE — Telephone Encounter (Signed)
Rx refill sent to pharmacy. 

## 2020-11-16 ENCOUNTER — Telehealth: Payer: Self-pay | Admitting: Cardiology

## 2020-11-16 ENCOUNTER — Other Ambulatory Visit: Payer: Self-pay

## 2020-11-16 MED ORDER — HYDRALAZINE HCL 25 MG PO TABS
25.0000 mg | ORAL_TABLET | Freq: Every day | ORAL | 1 refills | Status: DC
Start: 2020-11-16 — End: 2020-12-19

## 2020-11-16 NOTE — Telephone Encounter (Signed)
*  STAT* If patient is at the pharmacy, call can be transferred to refill team.   1. Which medications need to be refilled? (please list name of each medication and dose if known) hydrALAZINE (APRESOLINE) 25 MG tablet  2. Which pharmacy/location (including street and city if local pharmacy) is medication to be sent to? [place] [address]  3. Do they need a 30 day or 90 day supply? 90 day supply

## 2020-11-19 DIAGNOSIS — R931 Abnormal findings on diagnostic imaging of heart and coronary circulation: Secondary | ICD-10-CM | POA: Diagnosis not present

## 2020-11-19 DIAGNOSIS — Z95811 Presence of heart assist device: Secondary | ICD-10-CM | POA: Diagnosis not present

## 2020-11-19 DIAGNOSIS — I4821 Permanent atrial fibrillation: Secondary | ICD-10-CM | POA: Diagnosis not present

## 2020-11-22 DIAGNOSIS — R4701 Aphasia: Secondary | ICD-10-CM | POA: Diagnosis not present

## 2020-11-26 ENCOUNTER — Other Ambulatory Visit: Payer: Self-pay | Admitting: Cardiology

## 2020-11-26 DIAGNOSIS — N189 Chronic kidney disease, unspecified: Secondary | ICD-10-CM | POA: Diagnosis not present

## 2020-11-26 DIAGNOSIS — N281 Cyst of kidney, acquired: Secondary | ICD-10-CM | POA: Diagnosis not present

## 2020-11-26 NOTE — Telephone Encounter (Signed)
Pharmacy notified Lasix fill to soon.

## 2020-11-27 ENCOUNTER — Other Ambulatory Visit: Payer: Self-pay | Admitting: Cardiology

## 2020-11-28 ENCOUNTER — Telehealth: Payer: Self-pay

## 2020-11-28 NOTE — Telephone Encounter (Signed)
Patient's wife calling to check on the status of this refill request.

## 2020-11-28 NOTE — Telephone Encounter (Signed)
Left message regarding Lasix refill request being denied. Medication was refilled 11/13/20 # 90 tablets, refill request too soon. Instructed patient to call pharmacy if had further questions

## 2020-12-03 ENCOUNTER — Other Ambulatory Visit: Payer: Self-pay

## 2020-12-03 MED ORDER — FUROSEMIDE 40 MG PO TABS
40.0000 mg | ORAL_TABLET | Freq: Every day | ORAL | 2 refills | Status: DC
Start: 2020-12-03 — End: 2021-02-06

## 2020-12-03 NOTE — Telephone Encounter (Signed)
Refill sent to [facility] for Furosemide 40 mg.

## 2020-12-11 DIAGNOSIS — E038 Other specified hypothyroidism: Secondary | ICD-10-CM | POA: Diagnosis not present

## 2020-12-17 DIAGNOSIS — Z7982 Long term (current) use of aspirin: Secondary | ICD-10-CM | POA: Diagnosis not present

## 2020-12-17 DIAGNOSIS — Z8679 Personal history of other diseases of the circulatory system: Secondary | ICD-10-CM | POA: Diagnosis not present

## 2020-12-17 DIAGNOSIS — I4891 Unspecified atrial fibrillation: Secondary | ICD-10-CM | POA: Diagnosis not present

## 2020-12-17 DIAGNOSIS — Z7902 Long term (current) use of antithrombotics/antiplatelets: Secondary | ICD-10-CM | POA: Diagnosis not present

## 2020-12-17 DIAGNOSIS — I619 Nontraumatic intracerebral hemorrhage, unspecified: Secondary | ICD-10-CM | POA: Diagnosis not present

## 2020-12-17 DIAGNOSIS — G9389 Other specified disorders of brain: Secondary | ICD-10-CM | POA: Diagnosis not present

## 2020-12-18 ENCOUNTER — Other Ambulatory Visit: Payer: Self-pay | Admitting: Cardiology

## 2020-12-20 ENCOUNTER — Other Ambulatory Visit: Payer: Self-pay | Admitting: Cardiology

## 2020-12-20 ENCOUNTER — Telehealth: Payer: Self-pay | Admitting: Cardiology

## 2020-12-20 DIAGNOSIS — R4701 Aphasia: Secondary | ICD-10-CM | POA: Diagnosis not present

## 2020-12-20 NOTE — Telephone Encounter (Signed)
Pt's [name]) came in today to discuss medications/ Her concerns are as follows-  Pt takes Carvedilol- supposed to take 2 tablets daily, should be receiving 180 tablets for a 90 days supply- only receives 90.  Hydralazine- takes 2 a day but instructions say one a day so again, same issue- not receiving enough for a 90 day supply  amLODipine- instructions are to take 10 mg tablet once a day but he recieves 5 mg tablets so again has to take two- leaving him with only a 45 day supply  Pt's wife is Robert Spears- best number to reach her is [phone]

## 2020-12-20 NOTE — Telephone Encounter (Signed)
Rx refill sent to pharmacy. 

## 2020-12-21 NOTE — Telephone Encounter (Signed)
Of Hydralazine

## 2020-12-21 NOTE — Telephone Encounter (Signed)
I have updated his medication list, if you will review these prescriptions for refills.  We had him taking 25 mg daily and he has been taking it 2 times a day.  Informed patient's wife, Dr. Agustin Cree will review and we will call her back Monday.

## 2020-12-24 MED ORDER — AMLODIPINE BESYLATE 10 MG PO TABS: 10.0000 mg | ORAL_TABLET | Freq: Every day | ORAL | 1 refills | Status: DC

## 2020-12-24 MED ORDER — HYDRALAZINE HCL 25 MG PO TABS: 25.0000 mg | ORAL_TABLET | Freq: Two times a day (BID) | ORAL | 2 refills | Status: DC

## 2020-12-24 NOTE — Telephone Encounter (Signed)
Left message for patient to return call.

## 2020-12-24 NOTE — Telephone Encounter (Signed)
Please correct all his medications.

## 2020-12-24 NOTE — Telephone Encounter (Signed)
Patient's wife is returning call. 

## 2020-12-24 NOTE — Telephone Encounter (Signed)
Called and spoke to wife. Informed her Dr. Agustin Cree is ok to send in hydralazine 25 mg twice daily she also wanted a refill on amlodipine did this as well. No further questions.

## 2021-01-17 DIAGNOSIS — R4701 Aphasia: Secondary | ICD-10-CM | POA: Diagnosis not present

## 2021-01-27 ENCOUNTER — Other Ambulatory Visit: Payer: Self-pay | Admitting: Cardiology

## 2021-01-28 NOTE — Telephone Encounter (Signed)
Rx refill sent to pharmacy. 

## 2021-02-05 DIAGNOSIS — N1832 Chronic kidney disease, stage 3b: Secondary | ICD-10-CM | POA: Diagnosis not present

## 2021-02-06 ENCOUNTER — Other Ambulatory Visit: Payer: Self-pay | Admitting: Cardiology

## 2021-02-06 NOTE — Telephone Encounter (Signed)
Refill sent to pharmacy.   

## 2021-02-07 DIAGNOSIS — I129 Hypertensive chronic kidney disease with stage 1 through stage 4 chronic kidney disease, or unspecified chronic kidney disease: Secondary | ICD-10-CM | POA: Diagnosis not present

## 2021-02-07 DIAGNOSIS — Z87891 Personal history of nicotine dependence: Secondary | ICD-10-CM | POA: Diagnosis not present

## 2021-02-07 DIAGNOSIS — Z7984 Long term (current) use of oral hypoglycemic drugs: Secondary | ICD-10-CM | POA: Diagnosis not present

## 2021-02-07 DIAGNOSIS — I10 Essential (primary) hypertension: Secondary | ICD-10-CM | POA: Insufficient documentation

## 2021-02-07 DIAGNOSIS — E1122 Type 2 diabetes mellitus with diabetic chronic kidney disease: Secondary | ICD-10-CM | POA: Diagnosis not present

## 2021-02-07 DIAGNOSIS — N1832 Chronic kidney disease, stage 3b: Secondary | ICD-10-CM | POA: Diagnosis not present

## 2021-02-07 DIAGNOSIS — E559 Vitamin D deficiency, unspecified: Secondary | ICD-10-CM | POA: Diagnosis not present

## 2021-02-07 HISTORY — DX: Essential (primary) hypertension: I10

## 2021-02-11 DIAGNOSIS — Z7984 Long term (current) use of oral hypoglycemic drugs: Secondary | ICD-10-CM | POA: Diagnosis not present

## 2021-02-11 DIAGNOSIS — E119 Type 2 diabetes mellitus without complications: Secondary | ICD-10-CM | POA: Diagnosis not present

## 2021-02-11 DIAGNOSIS — Z95818 Presence of other cardiac implants and grafts: Secondary | ICD-10-CM | POA: Diagnosis not present

## 2021-02-11 DIAGNOSIS — I10 Essential (primary) hypertension: Secondary | ICD-10-CM | POA: Diagnosis not present

## 2021-02-11 DIAGNOSIS — I2510 Atherosclerotic heart disease of native coronary artery without angina pectoris: Secondary | ICD-10-CM | POA: Diagnosis not present

## 2021-02-11 DIAGNOSIS — I639 Cerebral infarction, unspecified: Secondary | ICD-10-CM | POA: Diagnosis not present

## 2021-02-11 DIAGNOSIS — E038 Other specified hypothyroidism: Secondary | ICD-10-CM | POA: Diagnosis not present

## 2021-02-11 DIAGNOSIS — I680 Cerebral amyloid angiopathy: Secondary | ICD-10-CM | POA: Diagnosis not present

## 2021-02-11 DIAGNOSIS — I4891 Unspecified atrial fibrillation: Secondary | ICD-10-CM | POA: Diagnosis not present

## 2021-02-11 DIAGNOSIS — R4701 Aphasia: Secondary | ICD-10-CM | POA: Diagnosis not present

## 2021-02-11 DIAGNOSIS — I619 Nontraumatic intracerebral hemorrhage, unspecified: Secondary | ICD-10-CM | POA: Diagnosis not present

## 2021-02-11 DIAGNOSIS — Z7902 Long term (current) use of antithrombotics/antiplatelets: Secondary | ICD-10-CM | POA: Diagnosis not present

## 2021-02-11 DIAGNOSIS — Z8673 Personal history of transient ischemic attack (TIA), and cerebral infarction without residual deficits: Secondary | ICD-10-CM | POA: Diagnosis not present

## 2021-02-11 DIAGNOSIS — E854 Organ-limited amyloidosis: Secondary | ICD-10-CM | POA: Diagnosis not present

## 2021-02-11 DIAGNOSIS — E785 Hyperlipidemia, unspecified: Secondary | ICD-10-CM | POA: Diagnosis not present

## 2021-02-11 DIAGNOSIS — Z951 Presence of aortocoronary bypass graft: Secondary | ICD-10-CM | POA: Diagnosis not present

## 2021-02-11 DIAGNOSIS — Z7901 Long term (current) use of anticoagulants: Secondary | ICD-10-CM | POA: Diagnosis not present

## 2021-02-14 DIAGNOSIS — R4701 Aphasia: Secondary | ICD-10-CM | POA: Diagnosis not present

## 2021-02-28 DIAGNOSIS — R0602 Shortness of breath: Secondary | ICD-10-CM | POA: Diagnosis not present

## 2021-02-28 DIAGNOSIS — J90 Pleural effusion, not elsewhere classified: Secondary | ICD-10-CM | POA: Diagnosis not present

## 2021-02-28 DIAGNOSIS — I10 Essential (primary) hypertension: Secondary | ICD-10-CM | POA: Diagnosis not present

## 2021-02-28 DIAGNOSIS — E8779 Other fluid overload: Secondary | ICD-10-CM | POA: Insufficient documentation

## 2021-03-04 DIAGNOSIS — E119 Type 2 diabetes mellitus without complications: Secondary | ICD-10-CM | POA: Diagnosis not present

## 2021-03-04 DIAGNOSIS — Z87891 Personal history of nicotine dependence: Secondary | ICD-10-CM | POA: Diagnosis not present

## 2021-03-04 DIAGNOSIS — I10 Essential (primary) hypertension: Secondary | ICD-10-CM | POA: Diagnosis not present

## 2021-03-04 DIAGNOSIS — E8779 Other fluid overload: Secondary | ICD-10-CM | POA: Diagnosis not present

## 2021-03-21 DIAGNOSIS — R4701 Aphasia: Secondary | ICD-10-CM | POA: Diagnosis not present

## 2021-04-08 DIAGNOSIS — I629 Nontraumatic intracranial hemorrhage, unspecified: Secondary | ICD-10-CM | POA: Diagnosis not present

## 2021-04-08 DIAGNOSIS — I639 Cerebral infarction, unspecified: Secondary | ICD-10-CM | POA: Diagnosis not present

## 2021-04-08 DIAGNOSIS — I4891 Unspecified atrial fibrillation: Secondary | ICD-10-CM | POA: Diagnosis not present

## 2021-04-08 DIAGNOSIS — E854 Organ-limited amyloidosis: Secondary | ICD-10-CM | POA: Diagnosis not present

## 2021-04-08 DIAGNOSIS — Z8673 Personal history of transient ischemic attack (TIA), and cerebral infarction without residual deficits: Secondary | ICD-10-CM | POA: Diagnosis not present

## 2021-04-08 DIAGNOSIS — I680 Cerebral amyloid angiopathy: Secondary | ICD-10-CM | POA: Diagnosis not present

## 2021-04-08 DIAGNOSIS — R4701 Aphasia: Secondary | ICD-10-CM | POA: Diagnosis not present

## 2021-04-08 DIAGNOSIS — Z95818 Presence of other cardiac implants and grafts: Secondary | ICD-10-CM | POA: Diagnosis not present

## 2021-04-08 DIAGNOSIS — I4821 Permanent atrial fibrillation: Secondary | ICD-10-CM | POA: Diagnosis not present

## 2021-04-10 DIAGNOSIS — I129 Hypertensive chronic kidney disease with stage 1 through stage 4 chronic kidney disease, or unspecified chronic kidney disease: Secondary | ICD-10-CM | POA: Diagnosis not present

## 2021-04-10 DIAGNOSIS — I10 Essential (primary) hypertension: Secondary | ICD-10-CM | POA: Diagnosis not present

## 2021-04-10 DIAGNOSIS — E119 Type 2 diabetes mellitus without complications: Secondary | ICD-10-CM | POA: Diagnosis not present

## 2021-04-10 DIAGNOSIS — Z0000 Encounter for general adult medical examination without abnormal findings: Secondary | ICD-10-CM | POA: Diagnosis not present

## 2021-04-10 DIAGNOSIS — E782 Mixed hyperlipidemia: Secondary | ICD-10-CM | POA: Diagnosis not present

## 2021-04-10 DIAGNOSIS — Z7984 Long term (current) use of oral hypoglycemic drugs: Secondary | ICD-10-CM | POA: Diagnosis not present

## 2021-04-10 DIAGNOSIS — N1832 Chronic kidney disease, stage 3b: Secondary | ICD-10-CM | POA: Diagnosis not present

## 2021-04-10 DIAGNOSIS — Z125 Encounter for screening for malignant neoplasm of prostate: Secondary | ICD-10-CM | POA: Diagnosis not present

## 2021-04-10 DIAGNOSIS — E1122 Type 2 diabetes mellitus with diabetic chronic kidney disease: Secondary | ICD-10-CM | POA: Diagnosis not present

## 2021-04-10 DIAGNOSIS — E038 Other specified hypothyroidism: Secondary | ICD-10-CM | POA: Diagnosis not present

## 2021-04-14 ENCOUNTER — Other Ambulatory Visit: Payer: Self-pay | Admitting: Cardiology

## 2021-04-15 NOTE — Telephone Encounter (Signed)
Zetia 10 mg # 90 x 3 refills sent to pharmacy

## 2021-04-18 DIAGNOSIS — R4701 Aphasia: Secondary | ICD-10-CM | POA: Diagnosis not present

## 2021-04-23 ENCOUNTER — Other Ambulatory Visit: Payer: Self-pay

## 2021-04-23 DIAGNOSIS — R634 Abnormal weight loss: Secondary | ICD-10-CM | POA: Insufficient documentation

## 2021-04-23 DIAGNOSIS — Z91030 Bee allergy status: Secondary | ICD-10-CM | POA: Insufficient documentation

## 2021-04-23 DIAGNOSIS — E7800 Pure hypercholesterolemia, unspecified: Secondary | ICD-10-CM | POA: Insufficient documentation

## 2021-04-23 DIAGNOSIS — I4891 Unspecified atrial fibrillation: Secondary | ICD-10-CM | POA: Insufficient documentation

## 2021-04-29 ENCOUNTER — Ambulatory Visit: Payer: PPO | Admitting: Cardiology

## 2021-04-29 ENCOUNTER — Encounter: Payer: Self-pay | Admitting: Cardiology

## 2021-04-29 ENCOUNTER — Other Ambulatory Visit: Payer: Self-pay

## 2021-04-29 VITALS — BP 140/86 | HR 78 | Ht 73.0 in | Wt 244.0 lb

## 2021-04-29 DIAGNOSIS — I2510 Atherosclerotic heart disease of native coronary artery without angina pectoris: Secondary | ICD-10-CM | POA: Diagnosis not present

## 2021-04-29 DIAGNOSIS — I10 Essential (primary) hypertension: Secondary | ICD-10-CM

## 2021-04-29 DIAGNOSIS — I509 Heart failure, unspecified: Secondary | ICD-10-CM

## 2021-04-29 DIAGNOSIS — E782 Mixed hyperlipidemia: Secondary | ICD-10-CM

## 2021-04-29 DIAGNOSIS — I4821 Permanent atrial fibrillation: Secondary | ICD-10-CM | POA: Diagnosis not present

## 2021-04-29 LAB — BASIC METABOLIC PANEL
BUN/Creatinine Ratio: 15 (ref 10–24)
BUN: 33 mg/dL — ABNORMAL HIGH (ref 8–27)
CO2: 21 mmol/L (ref 20–29)
Calcium: 9.3 mg/dL (ref 8.6–10.2)
Chloride: 102 mmol/L (ref 96–106)
Creatinine, Ser: 2.19 mg/dL — ABNORMAL HIGH (ref 0.76–1.27)
Glucose: 191 mg/dL — ABNORMAL HIGH (ref 65–99)
Potassium: 4.8 mmol/L (ref 3.5–5.2)
Sodium: 138 mmol/L (ref 134–144)
eGFR: 32 mL/min/{1.73_m2} — ABNORMAL LOW (ref >59)

## 2021-04-29 NOTE — Progress Notes (Signed)
Cardiology Office Note:    Date:  04/29/2021   ID:  Robert Spears, DOB [birth year], MRN [id number]  PCP:  Algis Greenhouse, MD  Cardiologist:  Jenne Campus, MD    Referring MD: Algis Greenhouse, MD   Chief Complaint  Patient presents with  . Medication Refill  . Follow-up    History of Present Illness:    Robert Spears is a 70 y.o. male with past medical history significant for coronary artery disease, status post coronary artery bypass graft many years ago, permanent atrial fibrillation, initially he was anticoagulated however end up having intracranial bleed, anticoagulation has been withdrawn and then he did have watchman device implanted.  He also got ischemic stroke while off anticoagulation.  That was in the end of last year.  He is coming today 2 months of follow-up about 6 weeks ago he started having shortness of breath look like he got typical proximal mitral dyspnea with exertional shortness of breath his primary care physician very appropriately increased dose of diuretic and improve his symptomatology he lost about 9 pounds shortness of breath is almost completely gone.  He can walk around with no major difficulties.  His wife is watching his weight weight is stable now.  He denies have any chest pain tightness squeezing pressure burning chest.  No palpitations no dizziness no passing out.  Past Medical History:  Diagnosis Date  . A-fib ([place])   . Acute laryngotracheitis 11/05/2020   Formatting of this note might be different from the original. 11/05/2020  . Allergy to bee sting   . Benign hypertension with CKD (chronic kidney disease) stage III ([place]) 03/22/2019  . Cerebral amyloid angiopathy ([place]) 01/22/2016   Formatting of this note might be different from the original. 2016: RIGHT ARM NUMB, RIGHT FACE WEAK, EXP DYSPHASIA; MOD CAROTID PLAQUE, AFIB 2018: residual numbness fingers right hand 2021: left occ-temp hemorrhage on NOAC with ex/rec dysphasia, lower facial droop,  LUE weakness  . Chronic bilateral low back pain without sciatica 08/01/2016   Formatting of this note might be different from the original. 2018: surg  . Coronary artery disease 01/21/2016   Formatting of this note might be different from the original. Managed CARDS (1997) onset (2005) PCI of RCA, PCI with DES of RCA  post CABS  Formatting of this note might be different from the original. Managed CARDS (1997) onset (2005) PCI of RCA, PCI with DES of RCA  post CABG  . Coronary artery disease involving native coronary artery of native heart without angina pectoris 08/09/2015  . DDD (degenerative disc disease), lumbar 08/24/2017   Formatting of this note might be different from the original. 2018: surgery  . Diabetes mellitus   . Dyslipidemia 08/09/2015  . Family history of premature coronary artery disease 09/17/2018   Formatting of this note might be different from the original. Brother  . Hemiparesis of right dominant side ([place]) 07/24/2020   Formatting of this note might be different from the original. 2021: left occ-temp hemorrhage  . High blood pressure   . High cholesterol   . Hypertension 10/02/2015  . Intracranial bleeding ([place]) 06/28/2020  . Intraparenchymal hemorrhage of brain ([place]) 06/23/2020  . Low vitamin B12 level 07/24/2020   Formatting of this note might be different from the original. 2021: 236  . Mixed hyperlipidemia 01/21/2016   Formatting of this note might be different from the original. 01/2015 Not at goal LDL 79 HDL 26 (LDL 70 HDL 40)  . Obesity (BMI  30-39.9) 11/01/2020  . Permanent atrial fibrillation ([place]) 01/22/2016   Formatting of this note might be different from the original. Managed CARDS 2014: DX CHADS2=2 RX ACT  . Presence of Watchman left atrial appendage closure device 10/06/2020  . Proteinuria 11/01/2020  . Status post coronary artery bypass graft 05/11/2020  . Stroke ([place])   . Subclinical hypothyroidism 07/30/2020  . Swelling of both lower extremities 06/28/2020  . TIA  (transient ischemic attack) 08/21/2015  . Type 2 diabetes mellitus with stage 3b chronic kidney disease, without long-term current use of insulin ([place]) 01/21/2016   Overview:  04/2015 A1C has increased to 7.6  . Uncontrolled hypertension 02/07/2021  . Weight loss   . Wellness examination 03/09/2020    Past Surgical History:  Procedure Laterality Date  . ARTERIAL BYPASS SURGRY    . CARDIAC SURGERY    . CORONARY ARTERY BYPASS GRAFT    . CORONARY STENT PLACEMENT    . LEFT ATRIAL APPENDAGE OCCLUSION  10/09/2020    Current Medications: Current Meds  Medication Sig  . amLODipine (NORVASC) 10 MG tablet Take 1 tablet (10 mg total) by mouth daily.  Marland Kitchen aspirin EC 81 MG tablet Take 81 mg by mouth daily.   Marland Kitchen atorvastatin (LIPITOR) 80 MG tablet Take 80 mg by mouth daily.   . carvedilol (COREG) 12.5 MG tablet Take 1 tablet by mouth twice daily (Patient taking differently: Take 12.5 mg by mouth 2 (two) times daily with a meal.)  . EPINEPHrine (EPIPEN 2-PAK) 0.3 mg/0.3 mL IJ SOAJ injection Use as directed for life-threatening allergic reaction. (Patient taking differently: Inject 0.3 mg into the muscle as needed for anaphylaxis. Use as directed for life-threatening allergic reaction.)  . ergocalciferol (VITAMIN D2) 1.25 MG (50000 UT) capsule Take 1 capsule by mouth once a week.  . ezetimibe (ZETIA) 10 MG tablet Take 1 tablet by mouth once daily (Patient taking differently: Take 10 mg by mouth daily.)  . furosemide (LASIX) 40 MG tablet Take 1 tablet by mouth once daily (Patient taking differently: Take 40 mg by mouth 2 (two) times daily.)  . glimepiride (AMARYL) 4 MG tablet Take 4 mg by mouth daily.  . hydrALAZINE (APRESOLINE) 100 MG tablet Take 100 mg by mouth 2 (two) times daily.  Marland Kitchen levothyroxine (SYNTHROID) 50 MCG tablet Take 50 mcg by mouth daily before breakfast.  . metFORMIN (GLUCOPHAGE) 1000 MG tablet Take 1,000 mg by mouth daily with breakfast.  . Multiple Vitamin (MULTI-VITAMINS) TABS Take 1 tablet  by mouth daily. Unknown strehgth  . nitroGLYCERIN (NITROSTAT) 0.4 MG SL tablet Place 1 tablet (0.4 mg total) under the tongue every 5 (five) minutes as needed. (Patient taking differently: Place 0.4 mg under the tongue every 5 (five) minutes as needed for chest pain.)  . [DISCONTINUED] levothyroxine (SYNTHROID) 25 MCG tablet Take 25 mcg by mouth daily before breakfast.     Allergies:   Bee venom   Social History   Socioeconomic History  . Marital status: Married    Spouse name: [name]  . Number of children: 2  . Years of education: Not on file  . Highest education level: Associate degree: occupational, Hotel manager, or vocational program  Occupational History  . Occupation: retired  Tobacco Use  . Smoking status: Former Smoker    Quit date: 02/25/1996    Years since quitting: 25.1  . Smokeless tobacco: Former Network engineer  . Vaping Use: Never used  Substance and Sexual Activity  . Alcohol use: No  . Drug use:  No  . Sexual activity: Not on file  Other Topics Concern  . Not on file  Social History Narrative   Patient is left-handed. He lives with his wife in a one level home. He drinks 2 cups of coffee a day and an occasional diet soda. He does not regularly exercise.   Social Determinants of Health   Financial Resource Strain: Not on file  Food Insecurity: Not on file  Transportation Needs: Not on file  Physical Activity: Not on file  Stress: Not on file  Social Connections: Not on file     Family History: The patient's family history includes Cancer in his sister; Diabetes in his brother, mother, and sister; Heart attack in his brother; Heart disease in his brother and sister; Heart disease (age of onset: 47) in his mother; High blood pressure in his brother and mother; Other (age of onset: 27) in his father. ROS:   Please see the history of present illness.    All 14 point review of systems negative except as described per history of present illness  EKGs/Labs/Other  Studies Reviewed:      Recent Labs: 07/03/2020: Spears 18; Creatinine, Ser 1.65; NT-Pro BNP 2,573; Potassium 4.9; Sodium 139  Recent Lipid Panel No results found for: CHOL, TRIG, HDL, CHOLHDL, VLDL, LDLCALC, LDLDIRECT  Physical Exam:    VS:  BP 140/86 (BP Location: Right Arm, Patient Position: Sitting)   Pulse 78   Ht 6\' 1"  (1.854 m)   Wt 244 lb (110.7 kg)   SpO2 97%   BMI 32.19 kg/m     Wt Readings from Last 3 Encounters:  04/29/21 244 lb (110.7 kg)  10/30/20 251 lb (113.9 kg)  08/06/20 250 lb (113.4 kg)     GEN:  Well nourished, well developed in no acute distress HEENT: Normal NECK: No JVD; No carotid bruits LYMPHATICS: No lymphadenopathy CARDIAC: , irregularly irregular,  Respiratory :  Clear to auscultation without rales, wheezing or rhonchi  ABDOMEN: Soft, non-tender, non-distended MUSCULOSKELETAL:  No edema; No deformity  SKIN: Warm and dry LOWER EXTREMITIES: 1+ swelling NEUROLOGIC:  Alert and oriented x 3 PSYCHIATRIC:  Normal affect   ASSESSMENT:    1. Coronary artery disease involving native coronary artery of native heart without angina pectoris   2. Chronic congestive heart failure, unspecified heart failure type (HCC)   3. Permanent atrial fibrillation ([place])   4. Primary hypertension   5. Mixed hyperlipidemia    PLAN:    In order of problems listed above:  1. Congestive heart failure was decompensated now mild decompensation his legs are still swollen.  I will get Chem-7 today.  He is on 40 mg of Lasix twice daily we will check his kidney function as well as potassium.  I will schedule him to have an echocardiogram within the next 2 weeks.  If we cannot do it in the office we will do it in the hospital.  He does have baseline kidney dysfunction, with creatinine last time (1.81. 2. Coronary disease stable denies have any chest pain tightness squeezing pressure burning chest. 3. Permanent atrial fibrillation, rate controlled, he does have watchman device off  anticoagulation secondary to intracranial bleed and then ischemic stroke. 4. Essential hypertension blood pressure still on the higher side again will wait for results of Chem-7 before decide about therapy. 5. Mixed dyslipidemia he is on atorvastatin 80 as well as Zetia 10 which I will continue, I did review his data from primary care physician with HDL of 27  and LDL of 53 this is from 27 April of this year.   Medication Adjustments/Labs and Tests Ordered: Current medicines are reviewed at length with the patient today.  Concerns regarding medicines are outlined above.  No orders of the defined types were placed in this encounter.  Medication changes: No orders of the defined types were placed in this encounter.   Signed, Park Liter, MD, [facility] 04/29/2021 8:30 AM    [place]

## 2021-04-29 NOTE — Patient Instructions (Addendum)
Medication Instructions:  Your physician recommends that you continue on your current medications as directed. Please refer to the Current Medication list given to you today.  *If you need a refill on your cardiac medications before your next appointment, please call your pharmacy*   Lab Work: Your physician recommends that you return for lab work today: bmp   If you have labs (blood work) drawn today and your tests are completely normal, you will receive your results only by: Marland Kitchen MyChart Message (if you have MyChart) OR . A paper copy in the mail If you have any lab test that is abnormal or we need to change your treatment, we will call you to review the results.   Testing/Procedures: Your physician has requested that you have an echocardiogram. Echocardiography is a painless test that uses sound waves to create images of your heart. It provides your doctor with information about the size and shape of your heart and how well your heart's chambers and valves are working. This procedure takes approximately one hour. There are no restrictions for this procedure.     Follow-Up: At [facility], you and your health needs are our priority.  As part of our continuing mission to provide you with exceptional heart care, we have created designated Provider Care Teams.  These Care Teams include your primary Cardiologist (physician) and Advanced Practice Providers (APPs -  Physician Assistants and Nurse Practitioners) who all work together to provide you with the care you need, when you need it.  We recommend signing up for the patient portal called "MyChart".  Sign up information is provided on this After Visit Summary.  MyChart is used to connect with patients for Virtual Visits (Telemedicine).  Patients are able to view lab/test results, encounter notes, upcoming appointments, etc.  Non-urgent messages can be sent to your provider as well.   To learn more about what you can do with MyChart, go to  [email or web address].    Your next appointment:   1 month(s)  The format for your next appointment:   In Person  Provider:   Jenne Campus, MD   Other Instructions   Echocardiogram An echocardiogram is a test that uses sound waves (ultrasound) to produce images of the heart. Images from an echocardiogram can provide important information about:  Heart size and shape.  The size and thickness and movement of your heart's walls.  Heart muscle function and strength.  Heart valve function or if you have stenosis. Stenosis is when the heart valves are too narrow.  If blood is flowing backward through the heart valves (regurgitation).  A tumor or infectious growth around the heart valves.  Areas of heart muscle that are not working well because of poor blood flow or injury from a heart attack.  Aneurysm detection. An aneurysm is a weak or damaged part of an artery wall. The wall bulges out from the normal force of blood pumping through the body. Tell a health care provider about:  Any allergies you have.  All medicines you are taking, including vitamins, herbs, eye drops, creams, and over-the-counter medicines.  Any blood disorders you have.  Any surgeries you have had.  Any medical conditions you have.  Whether you are pregnant or may be pregnant. What are the risks? Generally, this is a safe test. However, problems may occur, including an allergic reaction to dye (contrast) that may be used during the test. What happens before the test? No specific preparation is needed. You may eat and drink  normally. What happens during the test?  You will take off your clothes from the waist up and put on a hospital gown.  Electrodes or electrocardiogram (ECG)patches may be placed on your chest. The electrodes or patches are then connected to a device that monitors your heart rate and rhythm.  You will lie down on a table for an ultrasound exam. A gel will be applied to  your chest to help sound waves pass through your skin.  A handheld device, called a transducer, will be pressed against your chest and moved over your heart. The transducer produces sound waves that travel to your heart and bounce back (or "echo" back) to the transducer. These sound waves will be captured in real-time and changed into images of your heart that can be viewed on a video monitor. The images will be recorded on a computer and reviewed by your health care provider.  You may be asked to change positions or hold your breath for a short time. This makes it easier to get different views or better views of your heart.  In some cases, you may receive contrast through an IV in one of your veins. This can improve the quality of the pictures from your heart. The procedure may vary among health care providers and hospitals.   What can I expect after the test? You may return to your normal, everyday life, including diet, activities, and medicines, unless your health care provider tells you not to do that. Follow these instructions at home:  It is up to you to get the results of your test. Ask your health care provider, or the department that is doing the test, when your results will be ready.  Keep all follow-up visits. This is important. Summary  An echocardiogram is a test that uses sound waves (ultrasound) to produce images of the heart.  Images from an echocardiogram can provide important information about the size and shape of your heart, heart muscle function, heart valve function, and other possible heart problems.  You do not need to do anything to prepare before this test. You may eat and drink normally.  After the echocardiogram is completed, you may return to your normal, everyday life, unless your health care provider tells you not to do that. This information is not intended to replace advice given to you by your health care provider. Make sure you discuss any questions you have  with your health care provider. Document Revised: 07/24/2020 Document Reviewed: 07/24/2020 Elsevier Patient Education  2021 [employer].

## 2021-05-06 ENCOUNTER — Telehealth: Payer: Self-pay | Admitting: Cardiology

## 2021-05-06 DIAGNOSIS — Z79899 Other long term (current) drug therapy: Secondary | ICD-10-CM

## 2021-05-06 NOTE — Telephone Encounter (Signed)
Please call patient wife with lab results. [phone]

## 2021-05-06 NOTE — Telephone Encounter (Signed)
Called patient wife per dpr informed her of patient results and to have him decrease lasix to 40 mg in the morning and 20 mg in the evening daily. Also advised her that he needs to have labs drawn in 10 days to repeat kidney function she understood no further questions.

## 2021-05-07 DIAGNOSIS — I2510 Atherosclerotic heart disease of native coronary artery without angina pectoris: Secondary | ICD-10-CM | POA: Diagnosis not present

## 2021-05-07 DIAGNOSIS — I340 Nonrheumatic mitral (valve) insufficiency: Secondary | ICD-10-CM | POA: Diagnosis not present

## 2021-05-07 DIAGNOSIS — I361 Nonrheumatic tricuspid (valve) insufficiency: Secondary | ICD-10-CM | POA: Diagnosis not present

## 2021-05-10 ENCOUNTER — Telehealth: Payer: Self-pay

## 2021-05-10 NOTE — Telephone Encounter (Signed)
Patient failed to appear to his Echo appointment per [facility].

## 2021-05-14 ENCOUNTER — Telehealth: Payer: Self-pay | Admitting: Cardiology

## 2021-05-14 NOTE — Telephone Encounter (Signed)
Follow Up:     Pt 's wife is calling to see if pt Echo results are ready from last week?

## 2021-05-14 NOTE — Telephone Encounter (Signed)
Called patient wife informed her that the echo was done at [facility] and I will have to print and have Dr. Agustin Cree review once he gets in the office this pm. Advised her I will do this and call to let her know is result note and recommendations.

## 2021-05-14 NOTE — Telephone Encounter (Signed)
Called patient wife per dpr. Informed her per Dr. Agustin Cree echo was a good report not changes will see patient at next appointment. Patient wife per dpr understood no further questions.

## 2021-05-15 DIAGNOSIS — N183 Chronic kidney disease, stage 3 unspecified: Secondary | ICD-10-CM | POA: Diagnosis not present

## 2021-05-15 DIAGNOSIS — N1832 Chronic kidney disease, stage 3b: Secondary | ICD-10-CM | POA: Diagnosis not present

## 2021-05-15 DIAGNOSIS — I129 Hypertensive chronic kidney disease with stage 1 through stage 4 chronic kidney disease, or unspecified chronic kidney disease: Secondary | ICD-10-CM | POA: Diagnosis not present

## 2021-05-15 DIAGNOSIS — E1122 Type 2 diabetes mellitus with diabetic chronic kidney disease: Secondary | ICD-10-CM | POA: Diagnosis not present

## 2021-05-31 ENCOUNTER — Other Ambulatory Visit: Payer: Self-pay

## 2021-05-31 ENCOUNTER — Ambulatory Visit: Payer: PPO | Admitting: Cardiology

## 2021-05-31 ENCOUNTER — Encounter: Payer: Self-pay | Admitting: Cardiology

## 2021-05-31 VITALS — BP 130/80 | HR 68 | Ht 73.0 in | Wt 236.0 lb

## 2021-05-31 DIAGNOSIS — I2510 Atherosclerotic heart disease of native coronary artery without angina pectoris: Secondary | ICD-10-CM

## 2021-05-31 DIAGNOSIS — N1832 Chronic kidney disease, stage 3b: Secondary | ICD-10-CM | POA: Diagnosis not present

## 2021-05-31 DIAGNOSIS — M7989 Other specified soft tissue disorders: Secondary | ICD-10-CM | POA: Diagnosis not present

## 2021-05-31 DIAGNOSIS — E1122 Type 2 diabetes mellitus with diabetic chronic kidney disease: Secondary | ICD-10-CM | POA: Diagnosis not present

## 2021-05-31 DIAGNOSIS — I4821 Permanent atrial fibrillation: Secondary | ICD-10-CM

## 2021-05-31 DIAGNOSIS — I272 Pulmonary hypertension, unspecified: Secondary | ICD-10-CM | POA: Diagnosis not present

## 2021-05-31 NOTE — Patient Instructions (Signed)
Medication Instructions:  Your physician recommends that you continue on your current medications as directed. Please refer to the Current Medication list given to you today.  *If you need a refill on your cardiac medications before your next appointment, please call your pharmacy*   Lab Work: Your physician recommends that you return for lab work in: Today for [employer] and BMP  If you have labs (blood work) drawn today and your tests are completely normal, you will receive your results only by: MyChart Message (if you have [place]) OR A paper copy in the mail If you have any lab test that is abnormal or we need to change your treatment, we will call you to review the results.   Testing/Procedures: NONE   Follow-Up: At [facility], you and your health needs are our priority.  As part of our continuing mission to provide you with exceptional heart care, we have created designated Provider Care Teams.  These Care Teams include your primary Cardiologist (physician) and Advanced Practice Providers (APPs -  Physician Assistants and Nurse Practitioners) who all work together to provide you with the care you need, when you need it.  We recommend signing up for the patient portal called "MyChart".  Sign up information is provided on this After Visit Summary.  MyChart is used to connect with patients for Virtual Visits (Telemedicine).  Patients are able to view lab/test results, encounter notes, upcoming appointments, etc.  Non-urgent messages can be sent to your provider as well.   To learn more about what you can do with MyChart, go to [email or web address].    Your next appointment:   2 month(s)  The format for your next appointment:   In Person  Provider:   Jenne Campus, MD   Other Instructions

## 2021-05-31 NOTE — Progress Notes (Signed)
Cardiology Office Note:    Date:  05/31/2021   ID:  Robert Spears, DOB 17-Sep-1951, MRN [id number]  PCP:  Algis Greenhouse, MD  Cardiologist:  Jenne Campus, MD    Referring MD: Algis Greenhouse, MD   Chief Complaint  Patient presents with   Follow-up  I am doing fine  History of Present Illness:    Robert Spears is a 70 y.o. male with complex past medical history which include coronary artery disease, status post coronary artery bypass graft years ago, permanent atrial fibrillation, initially anticoagulated however end up having intracranial bleed, anticoagulation has been withdrawn and he end up having stroke which was ischemic.  After that he got watchman device.  Also have diastolic congestive heart failure, mild to moderate mitral regurgitation, recently discovered pulmonary hypertension.  Last time I seen him he was complaining of having swelling of lower extremities.  Swelling is still there but better.  He was put on diuretic with some improvement.  Denies have any chest pain tightness squeezing pressure burning chest.  He hates hot weather so he stays at home most of the time during the summer.  Past Medical History:  Diagnosis Date   A-fib [facility])    Acute laryngotracheitis 11/05/2020   Formatting of this note might be different from the original. 11/05/2020   Allergy to bee sting    Benign hypertension with CKD (chronic kidney disease) stage III ([place]) 03/22/2019   Cerebral amyloid angiopathy ([place]) 01/22/2016   Formatting of this note might be different from the original. 2016: RIGHT ARM NUMB, RIGHT FACE WEAK, EXP DYSPHASIA; MOD CAROTID PLAQUE, AFIB 2018: residual numbness fingers right hand 2021: left occ-temp hemorrhage on NOAC with ex/rec dysphasia, lower facial droop, LUE weakness   Chronic bilateral low back pain without sciatica 08/01/2016   Formatting of this note might be different from the original. 2018: surg   Coronary artery disease 01/21/2016   Formatting of this  note might be different from the original. Managed CARDS (1997) onset (2005) PCI of RCA, PCI with DES of RCA  post CABS  Formatting of this note might be different from the original. Managed CARDS (1997) onset (2005) PCI of RCA, PCI with DES of RCA  post CABG   Coronary artery disease involving native coronary artery of native heart without angina pectoris 08/09/2015   DDD (degenerative disc disease), lumbar 08/24/2017   Formatting of this note might be different from the original. 2018: surgery   Diabetes mellitus    Dyslipidemia 08/09/2015   Family history of premature coronary artery disease 09/17/2018   Formatting of this note might be different from the original. Brother   Hemiparesis of right dominant side ([place]) 07/24/2020   Formatting of this note might be different from the original. 2021: left occ-temp hemorrhage   High blood pressure    High cholesterol    Hypertension 10/02/2015   Intracranial bleeding ([place]) 06/28/2020   Intraparenchymal hemorrhage of brain ([place]) 06/23/2020   Low vitamin B12 level 07/24/2020   Formatting of this note might be different from the original. 2021: 236   Mixed hyperlipidemia 01/21/2016   Formatting of this note might be different from the original. 01/2015 Not at goal LDL 79 HDL 26 (LDL 70 HDL 40)   Obesity (BMI 30-39.9) 11/01/2020   Permanent atrial fibrillation ([place]) 01/22/2016   Formatting of this note might be different from the original. Managed CARDS 2014: DX CHADS2=2 RX ACT   Presence of Watchman left atrial appendage  closure device 10/06/2020   Proteinuria 11/01/2020   Status post coronary artery bypass graft 05/11/2020   Stroke ([place])    Subclinical hypothyroidism 07/30/2020   Swelling of both lower extremities 06/28/2020   TIA (transient ischemic attack) 08/21/2015   Type 2 diabetes mellitus with stage 3b chronic kidney disease, without long-term current use of insulin ([place]) 01/21/2016   Overview:  04/2015 A1C has increased to 7.6   Uncontrolled hypertension  02/07/2021   Weight loss    Wellness examination 03/09/2020    Past Surgical History:  Procedure Laterality Date   ARTERIAL BYPASS SURGRY     CARDIAC SURGERY     CORONARY ARTERY BYPASS GRAFT     CORONARY STENT PLACEMENT     LEFT ATRIAL APPENDAGE OCCLUSION  10/09/2020    Current Medications: Current Meds  Medication Sig   amLODipine (NORVASC) 10 MG tablet Take 1 tablet (10 mg total) by mouth daily.   aspirin EC 81 MG tablet Take 81 mg by mouth daily.    atorvastatin (LIPITOR) 80 MG tablet Take 80 mg by mouth daily.    carvedilol (COREG) 12.5 MG tablet Take 1 tablet by mouth twice daily   EPINEPHrine (EPIPEN 2-PAK) 0.3 mg/0.3 mL IJ SOAJ injection Inject 0.3 mg into the muscle as needed for anaphylaxis.   ergocalciferol (VITAMIN D2) 1.25 MG (50000 UT) capsule Take 1 capsule by mouth once a week.   ezetimibe (ZETIA) 10 MG tablet Take 1 tablet by mouth once daily   furosemide (LASIX) 40 MG tablet Take 40 mg by mouth 2 (two) times daily. Take 40mg  in am and 20 mg at night   glimepiride (AMARYL) 4 MG tablet Take 4 mg by mouth daily.   hydrALAZINE (APRESOLINE) 100 MG tablet Take 100 mg by mouth 2 (two) times daily.   levothyroxine (SYNTHROID) 50 MCG tablet Take 50 mcg by mouth daily before breakfast.   metFORMIN (GLUCOPHAGE) 1000 MG tablet Take 1,000 mg by mouth daily with breakfast.   Multiple Vitamin (MULTI-VITAMINS) TABS Take 1 tablet by mouth daily. Unknown strehgth   nitroGLYCERIN (NITROSTAT) 0.4 MG SL tablet Place 0.4 mg under the tongue every 5 (five) minutes as needed for chest pain.     Allergies:   Bee venom   Social History   Socioeconomic History   Marital status: Married    Spouse name: Pat   Number of children: 2   Years of education: Not on file   Highest education level: Associate degree: occupational, Hotel manager, or vocational program  Occupational History   Occupation: retired  Tobacco Use   Smoking status: Former    Pack years: 0.00    Types: Cigarettes     Quit date: 02/25/1996    Years since quitting: 25.2   Smokeless tobacco: Former  Scientific laboratory technician Use: Never used  Substance and Sexual Activity   Alcohol use: No   Drug use: No   Sexual activity: Not on file  Other Topics Concern   Not on file  Social History Narrative   Patient is left-handed. He lives with his wife in a one level home. He drinks 2 cups of coffee a day and an occasional diet soda. He does not regularly exercise.   Social Determinants of Health   Financial Resource Strain: Not on file  Food Insecurity: Not on file  Transportation Needs: Not on file  Physical Activity: Not on file  Stress: Not on file  Social Connections: Not on file     Family History:  The patient's family history includes Cancer in his sister; Diabetes in his brother, mother, and sister; Heart attack in his brother; Heart disease in his brother and sister; Heart disease (age of onset: 38) in his mother; High blood pressure in his brother and mother; Other (age of onset: 35) in his father. ROS:   Please see the history of present illness.    All 14 point review of systems negative except as described per history of present illness  EKGs/Labs/Other Studies Reviewed:      Recent Labs: 07/03/2020: NT-Pro BNP 2,573 04/29/2021: BUN 33; Creatinine, Ser 2.19; Potassium 4.8; Sodium 138  Recent Lipid Panel No results found for: CHOL, TRIG, HDL, CHOLHDL, VLDL, LDLCALC, LDLDIRECT  Physical Exam:    VS:  BP 130/80 (BP Location: Right Arm, Patient Position: Sitting, Cuff Size: Normal)   Pulse 68   Ht 6\' 1"  (1.854 m)   Wt 236 lb (107 kg)   SpO2 98%   BMI 31.14 kg/m     Wt Readings from Last 3 Encounters:  05/31/21 236 lb (107 kg)  04/29/21 244 lb (110.7 kg)  10/30/20 251 lb (113.9 kg)     GEN:  Well nourished, well developed in no acute distress HEENT: Normal NECK: No JVD; No carotid bruits LYMPHATICS: No lymphadenopathy CARDIAC: Irregular irregular, no murmurs, no rubs, no  gallops RESPIRATORY:  Clear to auscultation without rales, wheezing or rhonchi  ABDOMEN: Soft, non-tender, non-distended MUSCULOSKELETAL:  No edema; No deformity  SKIN: Warm and dry LOWER EXTREMITIES: no swelling NEUROLOGIC:  Alert and oriented x 3 PSYCHIATRIC:  Normal affect   ASSESSMENT:    1. Coronary artery disease involving native coronary artery of native heart without angina pectoris   2. Permanent atrial fibrillation ([place])   3. Type 2 diabetes mellitus with stage 3b chronic kidney disease, without long-term current use of insulin ([place])   4. Swelling of both lower extremities   5. Pulmonary hypertension, unspecified ([place])    PLAN:    In order of problems listed above:  Coronary artery disease: Stable from that point review.  I will continue present management. Permanent atrial fibrillation: He is anticoagulated which I will continue.  He is rate is controlled. Type 2 diabetes: To review K PN which show me his hemoglobin A1c is 7.4.  Probably need to work better on better control of diabetes. Swelling of lower extremities: We will check his proBNP today as well as Chem-7 to see if there is still some room to improve his symptomatology.  He is taking amlodipine if swelling becomes unbearable we may be forced to switch this medication. Pulmonary hypertension I suspect this is mostly group 2, looks like he does not have sleep apnea.  Again we will decide about therapy after we get Chem-7 and proBNP. Current medicines are reviewed at length with the patient today.  Concerns regarding medicines are outlined above.  Orders Placed This Encounter  Procedures   Pro b natriuretic peptide (BNP)   Basic metabolic panel   Medication changes: No orders of the defined types were placed in this encounter.   Signed, Park Liter, MD, [facility] 05/31/2021 8:23 AM    [place]

## 2021-06-01 LAB — BASIC METABOLIC PANEL
BUN/Creatinine Ratio: 16 (ref 10–24)
BUN: 32 mg/dL — ABNORMAL HIGH (ref 8–27)
CO2: 19 mmol/L — ABNORMAL LOW (ref 20–29)
Calcium: 9.8 mg/dL (ref 8.6–10.2)
Chloride: 103 mmol/L (ref 96–106)
Creatinine, Ser: 1.98 mg/dL — ABNORMAL HIGH (ref 0.76–1.27)
Glucose: 152 mg/dL — ABNORMAL HIGH (ref 65–99)
Potassium: 4.2 mmol/L (ref 3.5–5.2)
Sodium: 139 mmol/L (ref 134–144)
eGFR: 35 mL/min/{1.73_m2} — ABNORMAL LOW (ref >59)

## 2021-06-01 LAB — PRO B NATRIURETIC PEPTIDE: NT-Pro BNP: 1722 pg/mL — ABNORMAL HIGH (ref 0–376)

## 2021-06-05 ENCOUNTER — Telehealth: Payer: Self-pay

## 2021-06-05 NOTE — Telephone Encounter (Signed)
Spoke with patient regarding results and recommendation.  Patient verbalizes understanding and is agreeable to plan of care. Advised patient to call back with any issues or concerns.  

## 2021-06-05 NOTE — Telephone Encounter (Signed)
-----   Message from Park Liter, MD sent at 06/05/2021  9:42 AM EDT ----- Overall blood tests are looking better.  There is some confusion about how much he is taking Lasix.  He said in the chart 40 mg in the morning and 20 mg every afternoon.  If this is the case we need to increase furosemide to 40 mg in the morning 40 mg in the afternoon

## 2021-07-01 DIAGNOSIS — Z79899 Other long term (current) drug therapy: Secondary | ICD-10-CM | POA: Diagnosis not present

## 2021-07-01 DIAGNOSIS — Z87891 Personal history of nicotine dependence: Secondary | ICD-10-CM | POA: Diagnosis not present

## 2021-07-01 DIAGNOSIS — G9389 Other specified disorders of brain: Secondary | ICD-10-CM | POA: Diagnosis not present

## 2021-07-01 DIAGNOSIS — I10 Essential (primary) hypertension: Secondary | ICD-10-CM | POA: Diagnosis not present

## 2021-07-01 DIAGNOSIS — I618 Other nontraumatic intracerebral hemorrhage: Secondary | ICD-10-CM | POA: Diagnosis not present

## 2021-07-01 DIAGNOSIS — Z7982 Long term (current) use of aspirin: Secondary | ICD-10-CM | POA: Diagnosis not present

## 2021-07-01 DIAGNOSIS — I619 Nontraumatic intracerebral hemorrhage, unspecified: Secondary | ICD-10-CM | POA: Diagnosis not present

## 2021-07-28 ENCOUNTER — Other Ambulatory Visit: Payer: Self-pay | Admitting: Cardiology

## 2021-08-29 ENCOUNTER — Ambulatory Visit: Payer: PPO | Admitting: Cardiology

## 2021-08-29 ENCOUNTER — Encounter: Payer: Self-pay | Admitting: Cardiology

## 2021-08-29 ENCOUNTER — Other Ambulatory Visit: Payer: Self-pay

## 2021-08-29 VITALS — BP 148/76 | HR 70 | Ht 73.0 in | Wt 243.2 lb

## 2021-08-29 DIAGNOSIS — I272 Pulmonary hypertension, unspecified: Secondary | ICD-10-CM

## 2021-08-29 DIAGNOSIS — Z95818 Presence of other cardiac implants and grafts: Secondary | ICD-10-CM | POA: Diagnosis not present

## 2021-08-29 DIAGNOSIS — N183 Chronic kidney disease, stage 3 unspecified: Secondary | ICD-10-CM

## 2021-08-29 DIAGNOSIS — Z951 Presence of aortocoronary bypass graft: Secondary | ICD-10-CM | POA: Diagnosis not present

## 2021-08-29 DIAGNOSIS — I4821 Permanent atrial fibrillation: Secondary | ICD-10-CM

## 2021-08-29 DIAGNOSIS — I2510 Atherosclerotic heart disease of native coronary artery without angina pectoris: Secondary | ICD-10-CM

## 2021-08-29 DIAGNOSIS — I129 Hypertensive chronic kidney disease with stage 1 through stage 4 chronic kidney disease, or unspecified chronic kidney disease: Secondary | ICD-10-CM | POA: Diagnosis not present

## 2021-08-29 MED ORDER — FUROSEMIDE 40 MG PO TABS: 40.0000 mg | ORAL_TABLET | Freq: Two times a day (BID) | ORAL | 3 refills | Status: DC

## 2021-08-29 MED ORDER — HYDRALAZINE HCL 100 MG PO TABS: 100.0000 mg | ORAL_TABLET | Freq: Three times a day (TID) | ORAL | 3 refills | Status: DC

## 2021-08-29 NOTE — Addendum Note (Signed)
Addended by: Orvan July on: 08/29/2021 09:21 AM   Modules accepted: Orders

## 2021-08-29 NOTE — Addendum Note (Signed)
Addended by: Orvan July on: 08/29/2021 10:20 AM   Modules accepted: Orders

## 2021-08-29 NOTE — Progress Notes (Signed)
Cardiology Office Note:    Date:  08/29/2021   ID:  [name], DOB 1951-09-03, MRN [id number]  PCP:  Algis Greenhouse, MD  Cardiologist:  Jenne Campus, MD    Referring MD: Algis Greenhouse, MD   Chief Complaint  Patient presents with   Follow-up    Discuss upcoming TEE  Very well  History of Present Illness:    Robert Spears is a 70 y.o. male   with complex past medical history which include coronary artery disease, status post coronary artery bypass graft years ago, permanent atrial fibrillation, initially anticoagulated however end up having intracranial bleed, anticoagulation has been withdrawn and he end up having stroke which was ischemic.  After that he got watchman device.  Also have diastolic congestive heart failure, mild to moderate mitral regurgitation, recently discovered pulmonary hypertension.  Comes today to my office for follow-up overall he is doing well denies have any chest pain tightness squeezing pressure burning chest.  Swelling of lower extremities is still present but is relatively small and he is overall happy where he is.  Denies have any palpitations.  Past Medical History:  Diagnosis Date   A-fib [facility])    Acute laryngotracheitis 11/05/2020   Formatting of this note might be different from the original. 11/05/2020   Allergy to bee sting    Benign hypertension with CKD (chronic kidney disease) stage III ([place]) 03/22/2019   Cerebral amyloid angiopathy ([place]) 01/22/2016   Formatting of this note might be different from the original. 2016: RIGHT ARM NUMB, RIGHT FACE WEAK, EXP DYSPHASIA; MOD CAROTID PLAQUE, AFIB 2018: residual numbness fingers right hand 2021: left occ-temp hemorrhage on NOAC with ex/rec dysphasia, lower facial droop, LUE weakness   Chronic bilateral low back pain without sciatica 08/01/2016   Formatting of this note might be different from the original. 2018: surg   Coronary artery disease 01/21/2016   Formatting of this note might be  different from the original. Managed CARDS (1997) onset (2005) PCI of RCA, PCI with DES of RCA  post CABS  Formatting of this note might be different from the original. Managed CARDS (1997) onset (2005) PCI of RCA, PCI with DES of RCA  post CABG   Coronary artery disease involving native coronary artery of native heart without angina pectoris 08/09/2015   DDD (degenerative disc disease), lumbar 08/24/2017   Formatting of this note might be different from the original. 2018: surgery   Diabetes mellitus    Dyslipidemia 08/09/2015   Family history of premature coronary artery disease 09/17/2018   Formatting of this note might be different from the original. Brother   Hemiparesis of right dominant side ([place]) 07/24/2020   Formatting of this note might be different from the original. 2021: left occ-temp hemorrhage   High blood pressure    High cholesterol    Hypertension 10/02/2015   Intracranial bleeding ([place]) 06/28/2020   Intraparenchymal hemorrhage of brain ([place]) 06/23/2020   Low vitamin B12 level 07/24/2020   Formatting of this note might be different from the original. 2021: 236   Mixed hyperlipidemia 01/21/2016   Formatting of this note might be different from the original. 01/2015 Not at goal LDL 79 HDL 26 (LDL 70 HDL 40)   Obesity (BMI 30-39.9) 11/01/2020   Permanent atrial fibrillation ([place]) 01/22/2016   Formatting of this note might be different from the original. Managed CARDS 2014: DX CHADS2=2 RX ACT   Presence of Watchman left atrial appendage closure device 10/06/2020  Proteinuria 11/01/2020   Status post coronary artery bypass graft 05/11/2020   Stroke ([place])    Subclinical hypothyroidism 07/30/2020   Swelling of both lower extremities 06/28/2020   TIA (transient ischemic attack) 08/21/2015   Type 2 diabetes mellitus with stage 3b chronic kidney disease, without long-term current use of insulin ([place]) 01/21/2016   Overview:  04/2015 A1C has increased to 7.6   Uncontrolled hypertension 02/07/2021    Weight loss    Wellness examination 03/09/2020    Past Surgical History:  Procedure Laterality Date   ARTERIAL BYPASS SURGRY     CARDIAC SURGERY     CORONARY ARTERY BYPASS GRAFT     CORONARY STENT PLACEMENT     LEFT ATRIAL APPENDAGE OCCLUSION  10/09/2020    Current Medications: Current Meds  Medication Sig   amLODipine (NORVASC) 10 MG tablet Take 1 tablet (10 mg total) by mouth daily.   aspirin EC 81 MG tablet Take 81 mg by mouth daily.    atorvastatin (LIPITOR) 80 MG tablet Take 80 mg by mouth daily.    carvedilol (COREG) 12.5 MG tablet Take 1 tablet by mouth twice daily (Patient taking differently: Take 12.5 mg by mouth.)   EPINEPHrine 0.3 mg/0.3 mL IJ SOAJ injection Inject 0.3 mg into the muscle as needed for anaphylaxis.   ergocalciferol (VITAMIN D2) 1.25 MG (50000 UT) capsule Take 1 capsule by mouth once a week.   ezetimibe (ZETIA) 10 MG tablet Take 1 tablet by mouth once daily (Patient taking differently: Take 10 mg by mouth daily.)   furosemide (LASIX) 40 MG tablet Take 40 mg by mouth 2 (two) times daily.   glimepiride (AMARYL) 4 MG tablet Take 4 mg by mouth daily.   hydrALAZINE (APRESOLINE) 100 MG tablet Take 100 mg by mouth 2 (two) times daily.   levothyroxine (SYNTHROID) 50 MCG tablet Take 50 mcg by mouth daily before breakfast.   metFORMIN (GLUCOPHAGE) 1000 MG tablet Take 1,000 mg by mouth daily with breakfast.   Multiple Vitamin (MULTI-VITAMINS) TABS Take 1 tablet by mouth daily. Unknown strehgth   nitroGLYCERIN (NITROSTAT) 0.4 MG SL tablet Place 0.4 mg under the tongue every 5 (five) minutes as needed for chest pain.     Allergies:   Bee venom   Social History   Socioeconomic History   Marital status: Married    Spouse name: [name]   Number of children: 2   Years of education: Not on file   Highest education level: Associate degree: occupational, Hotel manager, or vocational program  Occupational History   Occupation: retired  Tobacco Use   Smoking status: Former     Types: Cigarettes    Quit date: 02/25/1996    Years since quitting: 25.5   Smokeless tobacco: Former  Scientific laboratory technician Use: Never used  Substance and Sexual Activity   Alcohol use: No   Drug use: No   Sexual activity: Not on file  Other Topics Concern   Not on file  Social History Narrative   Patient is left-handed. He lives with his wife in a one level home. He drinks 2 cups of coffee a day and an occasional diet soda. He does not regularly exercise.   Social Determinants of Health   Financial Resource Strain: Not on file  Food Insecurity: Not on file  Transportation Needs: Not on file  Physical Activity: Not on file  Stress: Not on file  Social Connections: Not on file     Family History: The patient's family history includes  Cancer in his sister; Diabetes in his brother, mother, and sister; Heart attack in his brother; Heart disease in his brother and sister; Heart disease (age of onset: 35) in his mother; High blood pressure in his brother and mother; Other (age of onset: 60) in his father. ROS:   Please see the history of present illness.    All 14 point review of systems negative except as described per history of present illness  EKGs/Labs/Other Studies Reviewed:      Recent Labs: 05/31/2021: BUN 32; Creatinine, Ser 1.98; NT-Pro BNP 1,722; Potassium 4.2; Sodium 139  Recent Lipid Panel No results found for: CHOL, TRIG, HDL, CHOLHDL, VLDL, LDLCALC, LDLDIRECT  Physical Exam:    VS:  BP (!) 148/76 (BP Location: Right Arm, Patient Position: Sitting)   Pulse 70   Ht '6\' 1"'$  (1.854 m)   Wt 243 lb 3.2 oz (110.3 kg)   SpO2 97%   BMI 32.09 kg/m     Wt Readings from Last 3 Encounters:  08/29/21 243 lb 3.2 oz (110.3 kg)  05/31/21 236 lb (107 kg)  04/29/21 244 lb (110.7 kg)     GEN:  Well nourished, well developed in no acute distress HEENT: Normal NECK: No JVD; No carotid bruits LYMPHATICS: No lymphadenopathy CARDIAC: Irregularly irregular, no murmurs, no rubs,  no gallops RESPIRATORY:  Clear to auscultation without rales, wheezing or rhonchi  ABDOMEN: Soft, non-tender, non-distended MUSCULOSKELETAL:  No edema; No deformity  SKIN: Warm and dry LOWER EXTREMITIES: no swelling NEUROLOGIC:  Alert and oriented x 3 PSYCHIATRIC:  Normal affect   ASSESSMENT:    1. Coronary artery disease involving native coronary artery of native heart without angina pectoris   2. Benign hypertension with CKD (chronic kidney disease) stage III (H[facility])   3. Permanent atrial fibrillation ([place])   4. Pulmonary hypertension, unspecified ([place])   5. Status post coronary artery bypass graft   6. Presence of Watchman left atrial appendage closure device    PLAN:    In order of problems listed above:  Coronary disease stable from that point review doing well. Permanent atrial fibrillation, not a candidate for anticoagulation he did have watchman device.  He is scheduled to have TEE in October as per protocol.  We discussed briefly the procedure he is ready to proceed. Pulmonary hypertension which is probably related to his diastolic dysfunction.  In the future we will repeat echocardiogram to see how he does.  Clinically he appears to be doing well. Diastolic congestive heart failure.  He does have some swelling of lower extremities contributing factor is amlodipine that he takes.  However he does not bring much therefore we will continue. Essential hypertension which is still not controlled.  I will increase dose of hydralazine 200 mg 3 times daily   Medication Adjustments/Labs and Tests Ordered: Current medicines are reviewed at length with the patient today.  Concerns regarding medicines are outlined above.  No orders of the defined types were placed in this encounter.  Medication changes: No orders of the defined types were placed in this encounter.   Signed, Park Liter, MD, [facility] 08/29/2021 9:13 AM    [place]

## 2021-08-29 NOTE — Patient Instructions (Signed)
Medication Instructions:  Your physician has recommended you make the following change in your medication:  INCREASE: Hydralazine (Apresoline) 100 mg three times daily *If you need a refill on your cardiac medications before your next appointment, please call your pharmacy*   Lab Work: None If you have labs (blood work) drawn today and your tests are completely normal, you will receive your results only by: [place] (if you have MyChart) OR A paper copy in the mail If you have any lab test that is abnormal or we need to change your treatment, we will call you to review the results.   Testing/Procedures: None   Follow-Up: At [facility], you and your health needs are our priority.  As part of our continuing mission to provide you with exceptional heart care, we have created designated Provider Care Teams.  These Care Teams include your primary Cardiologist (physician) and Advanced Practice Providers (APPs -  Physician Assistants and Nurse Practitioners) who all work together to provide you with the care you need, when you need it.  We recommend signing up for the patient portal called "MyChart".  Sign up information is provided on this After Visit Summary.  MyChart is used to connect with patients for Virtual Visits (Telemedicine).  Patients are able to view lab/test results, encounter notes, upcoming appointments, etc.  Non-urgent messages can be sent to your provider as well.   To learn more about what you can do with MyChart, go to [email or web address].    Your next appointment:   5 month(s)  The format for your next appointment:   In Person  Provider:   Jenne Campus, MD   Other Instructions

## 2021-09-12 DIAGNOSIS — J189 Pneumonia, unspecified organism: Secondary | ICD-10-CM | POA: Diagnosis not present

## 2021-09-12 DIAGNOSIS — Z20822 Contact with and (suspected) exposure to covid-19: Secondary | ICD-10-CM | POA: Diagnosis not present

## 2021-09-19 DIAGNOSIS — J189 Pneumonia, unspecified organism: Secondary | ICD-10-CM | POA: Diagnosis not present

## 2021-09-19 DIAGNOSIS — Z23 Encounter for immunization: Secondary | ICD-10-CM | POA: Diagnosis not present

## 2021-09-26 ENCOUNTER — Other Ambulatory Visit: Payer: Self-pay | Admitting: Cardiology

## 2021-10-04 DIAGNOSIS — Z79899 Other long term (current) drug therapy: Secondary | ICD-10-CM | POA: Diagnosis not present

## 2021-10-04 DIAGNOSIS — Z95818 Presence of other cardiac implants and grafts: Secondary | ICD-10-CM | POA: Diagnosis not present

## 2021-10-04 DIAGNOSIS — I051 Rheumatic mitral insufficiency: Secondary | ICD-10-CM | POA: Diagnosis not present

## 2021-10-04 DIAGNOSIS — Z48812 Encounter for surgical aftercare following surgery on the circulatory system: Secondary | ICD-10-CM | POA: Diagnosis not present

## 2021-10-04 DIAGNOSIS — I340 Nonrheumatic mitral (valve) insufficiency: Secondary | ICD-10-CM | POA: Diagnosis not present

## 2021-10-04 DIAGNOSIS — Z951 Presence of aortocoronary bypass graft: Secondary | ICD-10-CM | POA: Diagnosis not present

## 2021-10-04 DIAGNOSIS — Z95811 Presence of heart assist device: Secondary | ICD-10-CM | POA: Diagnosis not present

## 2021-10-04 DIAGNOSIS — Z87891 Personal history of nicotine dependence: Secondary | ICD-10-CM | POA: Diagnosis not present

## 2021-10-04 DIAGNOSIS — Z7982 Long term (current) use of aspirin: Secondary | ICD-10-CM | POA: Diagnosis not present

## 2021-10-04 DIAGNOSIS — I4821 Permanent atrial fibrillation: Secondary | ICD-10-CM | POA: Diagnosis not present

## 2021-10-12 ENCOUNTER — Other Ambulatory Visit: Payer: Self-pay | Admitting: Cardiology

## 2021-10-25 DIAGNOSIS — I69951 Hemiplegia and hemiparesis following unspecified cerebrovascular disease affecting right dominant side: Secondary | ICD-10-CM | POA: Diagnosis not present

## 2021-10-25 DIAGNOSIS — I129 Hypertensive chronic kidney disease with stage 1 through stage 4 chronic kidney disease, or unspecified chronic kidney disease: Secondary | ICD-10-CM | POA: Diagnosis not present

## 2021-10-25 DIAGNOSIS — E782 Mixed hyperlipidemia: Secondary | ICD-10-CM | POA: Diagnosis not present

## 2021-10-25 DIAGNOSIS — Z7984 Long term (current) use of oral hypoglycemic drugs: Secondary | ICD-10-CM | POA: Diagnosis not present

## 2021-10-25 DIAGNOSIS — E1122 Type 2 diabetes mellitus with diabetic chronic kidney disease: Secondary | ICD-10-CM | POA: Diagnosis not present

## 2021-10-25 DIAGNOSIS — Z87891 Personal history of nicotine dependence: Secondary | ICD-10-CM | POA: Diagnosis not present

## 2021-10-25 DIAGNOSIS — N1832 Chronic kidney disease, stage 3b: Secondary | ICD-10-CM | POA: Diagnosis not present

## 2021-10-25 DIAGNOSIS — Z0000 Encounter for general adult medical examination without abnormal findings: Secondary | ICD-10-CM | POA: Diagnosis not present

## 2021-11-14 DIAGNOSIS — N183 Chronic kidney disease, stage 3 unspecified: Secondary | ICD-10-CM | POA: Diagnosis not present

## 2021-11-14 DIAGNOSIS — E1122 Type 2 diabetes mellitus with diabetic chronic kidney disease: Secondary | ICD-10-CM | POA: Diagnosis not present

## 2021-11-14 DIAGNOSIS — I129 Hypertensive chronic kidney disease with stage 1 through stage 4 chronic kidney disease, or unspecified chronic kidney disease: Secondary | ICD-10-CM | POA: Diagnosis not present

## 2021-11-14 DIAGNOSIS — N1832 Chronic kidney disease, stage 3b: Secondary | ICD-10-CM | POA: Diagnosis not present

## 2021-11-14 DIAGNOSIS — E669 Obesity, unspecified: Secondary | ICD-10-CM | POA: Diagnosis not present

## 2021-12-26 ENCOUNTER — Other Ambulatory Visit: Payer: Self-pay | Admitting: Cardiology

## 2022-02-13 ENCOUNTER — Encounter: Payer: Self-pay | Admitting: Cardiology

## 2022-02-13 ENCOUNTER — Other Ambulatory Visit: Payer: Self-pay

## 2022-02-13 ENCOUNTER — Ambulatory Visit: Payer: PPO | Admitting: Cardiology

## 2022-02-13 VITALS — BP 132/74 | HR 78 | Ht 73.0 in | Wt 240.6 lb

## 2022-02-13 DIAGNOSIS — R4701 Aphasia: Secondary | ICD-10-CM

## 2022-02-13 DIAGNOSIS — G459 Transient cerebral ischemic attack, unspecified: Secondary | ICD-10-CM | POA: Diagnosis not present

## 2022-02-13 DIAGNOSIS — I639 Cerebral infarction, unspecified: Secondary | ICD-10-CM

## 2022-02-13 DIAGNOSIS — I2510 Atherosclerotic heart disease of native coronary artery without angina pectoris: Secondary | ICD-10-CM

## 2022-02-13 DIAGNOSIS — I272 Pulmonary hypertension, unspecified: Secondary | ICD-10-CM

## 2022-02-13 DIAGNOSIS — I10 Essential (primary) hypertension: Secondary | ICD-10-CM

## 2022-02-13 DIAGNOSIS — N183 Chronic kidney disease, stage 3 unspecified: Secondary | ICD-10-CM

## 2022-02-13 DIAGNOSIS — I129 Hypertensive chronic kidney disease with stage 1 through stage 4 chronic kidney disease, or unspecified chronic kidney disease: Secondary | ICD-10-CM | POA: Diagnosis not present

## 2022-02-13 NOTE — Progress Notes (Signed)
Cardiology Office Note:    Date:  02/13/2022   ID:  Robert Spears, DOB Mar 04, 1951, MRN [id number]  PCP:  Algis Greenhouse, MD  Cardiologist:  Jenne Campus, MD    Referring MD: Algis Greenhouse, MD   Chief Complaint  Patient presents with   Follow-up  Doing fine but had episode few weeks ago  History of Present Illness:    Robert Spears is a 71 y.o. male   with complex past medical history which include coronary artery disease, status post coronary artery bypass graft years ago, permanent atrial fibrillation, initially anticoagulated however end up having intracranial bleed, anticoagulation has been withdrawn and he end up having stroke which was ischemic.  After that he got watchman device.  Also have diastolic congestive heart failure, mild to moderate mitral regurgitation, recently discovered pulmonary hypertension.  Few weeks ago he was throwing garbage after the dumpster and started feeling some strength sensation he has difficulty describing exactly what he felt he fell  unsteady there is no chest pain no shortness of breath no palpitations just some kind of strange feeling and unsteadiness.  That sensation lasted few minutes.  Since that time everything looks normal and actually overall he seems to be doing well he is active at home doing well, working in the garden.  Past Medical History:  Diagnosis Date   A-fib [facility])    Acute laryngotracheitis 11/05/2020   Formatting of this note might be different from the original. 11/05/2020   Allergy to bee sting    Benign hypertension with CKD (chronic kidney disease) stage III ([place]) 03/22/2019   Cerebral amyloid angiopathy ([place]) 01/22/2016   Formatting of this note might be different from the original. 2016: RIGHT ARM NUMB, RIGHT FACE WEAK, EXP DYSPHASIA; MOD CAROTID PLAQUE, AFIB 2018: residual numbness fingers right hand 2021: left occ-temp hemorrhage on NOAC with ex/rec dysphasia, lower facial droop, LUE weakness   Chronic bilateral  low back pain without sciatica 08/01/2016   Formatting of this note might be different from the original. 2018: surg   Coronary artery disease 01/21/2016   Formatting of this note might be different from the original. Managed CARDS (1997) onset (2005) PCI of RCA, PCI with DES of RCA  post CABS  Formatting of this note might be different from the original. Managed CARDS (1997) onset (2005) PCI of RCA, PCI with DES of RCA  post CABG   Coronary artery disease involving native coronary artery of native heart without angina pectoris 08/09/2015   DDD (degenerative disc disease), lumbar 08/24/2017   Formatting of this note might be different from the original. 2018: surgery   Diabetes mellitus    Dyslipidemia 08/09/2015   Family history of premature coronary artery disease 09/17/2018   Formatting of this note might be different from the original. Brother   Hemiparesis of right dominant side ([place]) 07/24/2020   Formatting of this note might be different from the original. 2021: left occ-temp hemorrhage   High blood pressure    High cholesterol    Hypertension 10/02/2015   Intracranial bleeding ([place]) 06/28/2020   Intraparenchymal hemorrhage of brain ([place]) 06/23/2020   Low vitamin B12 level 07/24/2020   Formatting of this note might be different from the original. 2021: 236   Mixed hyperlipidemia 01/21/2016   Formatting of this note might be different from the original. 01/2015 Not at goal LDL 79 HDL 26 (LDL 70 HDL 40)   Obesity (BMI 30-39.9) 11/01/2020   Permanent atrial fibrillation ([place])  01/22/2016   Formatting of this note might be different from the original. Managed CARDS 2014: DX CHADS2=2 RX ACT   Presence of Watchman left atrial appendage closure device 10/06/2020   Proteinuria 11/01/2020   Status post coronary artery bypass graft 05/11/2020   Stroke ([place])    Subclinical hypothyroidism 07/30/2020   Swelling of both lower extremities 06/28/2020   TIA (transient ischemic attack) 08/21/2015   Type 2 diabetes  mellitus with stage 3b chronic kidney disease, without long-term current use of insulin ([place]) 01/21/2016   Overview:  04/2015 A1C has increased to 7.6   Uncontrolled hypertension 02/07/2021   Weight loss    Wellness examination 03/09/2020    Past Surgical History:  Procedure Laterality Date   ARTERIAL BYPASS SURGRY     CARDIAC SURGERY     CORONARY ARTERY BYPASS GRAFT     CORONARY STENT PLACEMENT     LEFT ATRIAL APPENDAGE OCCLUSION  10/09/2020    Current Medications: Current Meds  Medication Sig   amLODipine (NORVASC) 10 MG tablet Take 1 tablet by mouth once daily (Patient taking differently: Take 10 mg by mouth daily.)   aspirin EC 81 MG tablet Take 81 mg by mouth daily.    atorvastatin (LIPITOR) 80 MG tablet Take 80 mg by mouth daily.    carvedilol (COREG) 12.5 MG tablet Take 1 tablet by mouth twice daily (Patient taking differently: Take 12.5 mg by mouth 2 (two) times daily with a meal.)   EPINEPHrine 0.3 mg/0.3 mL IJ SOAJ injection Inject 0.3 mg into the muscle as needed for anaphylaxis.   ergocalciferol (VITAMIN D2) 1.25 MG (50000 UT) capsule Take 1 capsule by mouth once a week.   ezetimibe (ZETIA) 10 MG tablet Take 1 tablet by mouth once daily (Patient taking differently: Take 10 mg by mouth daily.)   furosemide (LASIX) 40 MG tablet Take 1 tablet by mouth once daily (Patient taking differently: Take 40 mg by mouth daily.)   glimepiride (AMARYL) 4 MG tablet Take 4 mg by mouth daily.   hydrALAZINE (APRESOLINE) 100 MG tablet Take 1 tablet (100 mg total) by mouth 3 (three) times daily.   levothyroxine (SYNTHROID) 50 MCG tablet Take 50 mcg by mouth daily before breakfast.   metFORMIN (GLUCOPHAGE) 1000 MG tablet Take 1,000 mg by mouth daily with breakfast.   Multiple Vitamin (MULTI-VITAMINS) TABS Take 1 tablet by mouth daily. Unknown strehgth   nitroGLYCERIN (NITROSTAT) 0.4 MG SL tablet Place 0.4 mg under the tongue every 5 (five) minutes as needed for chest pain.     Allergies:   Bee  venom   Social History   Socioeconomic History   Marital status: Married    Spouse name: Pat   Number of children: 2   Years of education: Not on file   Highest education level: Associate degree: occupational, Hotel manager, or vocational program  Occupational History   Occupation: retired  Tobacco Use   Smoking status: Former    Types: Cigarettes    Quit date: 02/25/1996    Years since quitting: 25.9   Smokeless tobacco: Former  Scientific laboratory technician Use: Never used  Substance and Sexual Activity   Alcohol use: No   Drug use: No   Sexual activity: Not on file  Other Topics Concern   Not on file  Social History Narrative   Patient is left-handed. He lives with his wife in a one level home. He drinks 2 cups of coffee a day and an occasional diet soda. He does  not regularly exercise.   Social Determinants of Health   Financial Resource Strain: Not on file  Food Insecurity: Not on file  Transportation Needs: Not on file  Physical Activity: Not on file  Stress: Not on file  Social Connections: Not on file     Family History: The patient's family history includes Cancer in his sister; Diabetes in his brother, mother, and sister; Heart attack in his brother; Heart disease in his brother and sister; Heart disease (age of onset: 71) in his mother; High blood pressure in his brother and mother; Other (age of onset: 52) in his father. ROS:   Please see the history of present illness.    All 14 point review of systems negative except as described per history of present illness  EKGs/Labs/Other Studies Reviewed:      Recent Labs: 05/31/2021: BUN 32; Creatinine, Ser 1.98; NT-Pro BNP 1,722; Potassium 4.2; Sodium 139  Recent Lipid Panel No results found for: CHOL, TRIG, HDL, CHOLHDL, VLDL, LDLCALC, LDLDIRECT  Physical Exam:    VS:  BP 132/74 (BP Location: Left Arm, Patient Position: Sitting)    Pulse 78    Ht 6\' 1"  (1.854 m)    Wt 240 lb 9.6 oz (109.1 kg)    SpO2 99%    BMI 31.74  kg/m     Wt Readings from Last 3 Encounters:  02/13/22 240 lb 9.6 oz (109.1 kg)  08/29/21 243 lb 3.2 oz (110.3 kg)  05/31/21 236 lb (107 kg)     GEN:  Well nourished, well developed in no acute distress HEENT: Normal NECK: No JVD; No carotid bruits LYMPHATICS: No lymphadenopathy CARDIAC: Irregularly irregular, no murmurs, no rubs, no gallops RESPIRATORY:  Clear to auscultation without rales, wheezing or rhonchi  ABDOMEN: Soft, non-tender, non-distended MUSCULOSKELETAL:  No edema; No deformity  SKIN: Warm and dry LOWER EXTREMITIES: no swelling NEUROLOGIC:  Alert and oriented x 3 PSYCHIATRIC:  Normal affect   ASSESSMENT:    1. Coronary artery disease involving native coronary artery of native heart without angina pectoris   2. Primary hypertension   3. Benign hypertension with CKD (chronic kidney disease) stage III ([place])   4. TIA (transient ischemic attack)   5. 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 Pulmonary hypertension, unspecified ([place])    PLAN:    In order of problems listed above:  Coronary artery disease: Stable from that point review.  On antiplatelet therapy. Permanent atrial fibrillation, not anticoagulated because of history of intracranial bleed, status post Watchman device.  I did review TEE done by group from [facility] after Watchman device has been placed.  There is only minimal leak.  Benign essential hypertension.  Well-controlled continue present management. Dyslipidemia I did review data from primary care physician office LDL 67 HDL 30.  He is on high intensity statin already which I will continue. Chronic kidney failure.  He was seen by nephrologist lately and apparently stable.  Overall he is stabilized.  Continue present management.   Medication Adjustments/Labs and Tests Ordered: Current medicines are reviewed at length with the patient today.  Concerns regarding medicines are outlined above.   No orders of the defined types were placed in this encounter.  Medication changes: No orders of the defined types were placed in this encounter.   Signed, Park Liter, MD, [facility] 02/13/2022 8:58 AM    [place]

## 2022-02-13 NOTE — Patient Instructions (Signed)

## 2022-03-25 ENCOUNTER — Other Ambulatory Visit: Payer: Self-pay | Admitting: Cardiology

## 2022-04-08 ENCOUNTER — Other Ambulatory Visit: Payer: Self-pay | Admitting: Cardiology

## 2022-04-08 NOTE — Telephone Encounter (Signed)
Rx refill sent to pharmacy. 

## 2022-06-03 ENCOUNTER — Telehealth: Payer: Self-pay

## 2022-06-03 NOTE — Telephone Encounter (Signed)
   Name: Robert Spears  DOB: 1951/11/05  MRN: [id number]   Primary Cardiologist: Jenne Campus, MD  Chart reviewed as part of pre-operative protocol coverage. We are asked for guidance to hold ASA for a colonoscopy. Pt has a history of CAD with prior CABG, and stroke. He is on ASA monotherapy. For colonoscopy without biopsy, we recommend continuation of ASA without interruption. If biopsies are planned, may hold ASA 5-7 days if bleeding risk is high.  I will route this recommendation to the requesting party via Epic fax function and remove from pre-op pool. Please call with questions.  Tami Lin Macil Crady, PA 06/03/2022, 12:51 PM

## 2022-06-03 NOTE — Telephone Encounter (Signed)
   Pre-operative Risk Assessment    [name]  DOB: 04-09-1951 MRN: [id number]      Request for Surgical Clearance    Procedure:   colonscopy  Date of Surgery:  Clearance TBD                                 Surgeon:  Dr. Silas [place] Group or Practice Name:  [facility] Phone number:  [phone] Fax number:  [phone]   Type of Clearance Requested:   - Pharmacy:  Hold Aspirin please advise   Type of Anesthesia:   propofol   Additional requests/questions:    SignedLowella Grip   06/03/2022, 11:19 AM

## 2022-06-03 NOTE — Telephone Encounter (Signed)
Calling to f/u on Clearance. Robert Spears states that if they are able to get a response today that would be great being that pt is scheduled to have procedure on Monday 6/26. Please advise

## 2022-06-23 ENCOUNTER — Other Ambulatory Visit: Payer: Self-pay | Admitting: Cardiology

## 2022-07-16 ENCOUNTER — Ambulatory Visit: Payer: PPO | Admitting: Cardiology

## 2022-09-03 ENCOUNTER — Other Ambulatory Visit: Payer: Self-pay

## 2022-09-03 ENCOUNTER — Telehealth: Payer: Self-pay | Admitting: Cardiology

## 2022-09-03 MED ORDER — FUROSEMIDE 40 MG PO TABS: 40.0000 mg | ORAL_TABLET | Freq: Every day | ORAL | 3 refills | Status: DC

## 2022-09-03 NOTE — Telephone Encounter (Signed)
  *  STAT* If patient is at the pharmacy, call can be transferred to refill team.   1. Which medications need to be refilled? (please list name of each medication and dose if known) furosemide (LASIX) 40 MG tablet  2. Which pharmacy/location (including street and city if local pharmacy) is medication to be sent to?[place], [place] [address]  3. Do they need a 30 day or 90 day supply? 90 days

## 2022-09-08 ENCOUNTER — Telehealth: Payer: Self-pay | Admitting: Cardiology

## 2022-09-08 MED ORDER — FUROSEMIDE 40 MG PO TABS: 40.0000 mg | ORAL_TABLET | Freq: Two times a day (BID) | ORAL | 3 refills | Status: DC

## 2022-09-08 NOTE — Telephone Encounter (Signed)
Spoke with spouse. Pt has been taking Lasix '40mg'$  since 06-05-21 note from Dr. Agustin Cree to increase. He has been followed by PCP and Nephrologist with routine blood work with no abnormalities per spouse. Pt has appt with Dr Agustin Cree on 10-15-22. Advised to continue '40mg'$  bid until appt on 11-1 and discuss with Dr. Agustin Cree. Pts spouse agre and verbalized understanding. She had no further questions.

## 2022-09-08 NOTE — Telephone Encounter (Signed)
Pt c/o medication issue:  1. Name of Medication:  furosemide (LASIX) 40 MG tablet  2. How are you currently taking this medication (dosage and times per day)?   3. Are you having a reaction (difficulty breathing--STAT)?   4. What is your medication issue?   New Rx instructs the patient to take 40 MG once daily, but patient's wife states he has been taking 40 MG twice daily by mouth--has not been advised to decrease. She would like to have this corrected so that it will not effect quantity.

## 2022-09-18 ENCOUNTER — Other Ambulatory Visit: Payer: Self-pay | Admitting: Cardiology

## 2022-09-18 NOTE — Telephone Encounter (Signed)
Refill to pharmacy 

## 2022-10-15 ENCOUNTER — Ambulatory Visit: Payer: PPO | Attending: Cardiology | Admitting: Cardiology

## 2022-10-15 ENCOUNTER — Encounter: Payer: Self-pay | Admitting: Cardiology

## 2022-10-15 VITALS — BP 150/76 | HR 69 | Ht 73.0 in | Wt 237.6 lb

## 2022-10-15 DIAGNOSIS — I10 Essential (primary) hypertension: Secondary | ICD-10-CM | POA: Diagnosis not present

## 2022-10-15 DIAGNOSIS — I4821 Permanent atrial fibrillation: Secondary | ICD-10-CM | POA: Diagnosis not present

## 2022-10-15 DIAGNOSIS — Z95818 Presence of other cardiac implants and grafts: Secondary | ICD-10-CM | POA: Diagnosis not present

## 2022-10-15 DIAGNOSIS — I2510 Atherosclerotic heart disease of native coronary artery without angina pectoris: Secondary | ICD-10-CM

## 2022-10-15 NOTE — Progress Notes (Signed)
Cardiology Office Note:    Date:  10/15/2022   ID:  Robert Spears, DOB [birth year], MRN [id number]  PCP:  Algis Greenhouse, MD  Cardiologist:  Jenne Campus, MD    Referring MD: Algis Greenhouse, MD   Chief Complaint  Patient presents with   follow up stroke    History of Present Illness:    Robert Spears is a 71 y.o. male with past medical history significant for coronary artery disease, status post coronary bypass graft many years ago, permanent atrial fibrillation.  He used to be anticoagulated but developed intracranial bleed in 2021 anticoagulation has been withdrawn and he received Watchman device, also essential hypertension, dyslipidemia, diabetes he comes today to my office for follow-up he suffered another stroke.  He was managed by [facility], still have some difficulty speaking still some balance issue he did participate in rehab with good improvement.  Cardiac testing done at [facility] has been reviewed.  Showed preserved left ventricle ejection fraction  Past Medical History:  Diagnosis Date   A-fib [facility])    Acute laryngotracheitis 11/05/2020   Formatting of this note might be different from the original. 11/05/2020   Allergy to bee sting    Benign hypertension with CKD (chronic kidney disease) stage III ([place]) 03/22/2019   Cerebral amyloid angiopathy ([place]) 01/22/2016   Formatting of this note might be different from the original. 2016: RIGHT ARM NUMB, RIGHT FACE WEAK, EXP DYSPHASIA; MOD CAROTID PLAQUE, AFIB 2018: residual numbness fingers right hand 2021: left occ-temp hemorrhage on NOAC with ex/rec dysphasia, lower facial droop, LUE weakness   Chronic bilateral low back pain without sciatica 08/01/2016   Formatting of this note might be different from the original. 2018: surg   Coronary artery disease 01/21/2016   Formatting of this note might be different from the original. Managed CARDS (1997) onset (2005) PCI of RCA, PCI with DES of RCA  post CABS  Formatting of  this note might be different from the original. Managed CARDS (1997) onset (2005) PCI of RCA, PCI with DES of RCA  post CABG   Coronary artery disease involving native coronary artery of native heart without angina pectoris 08/09/2015   DDD (degenerative disc disease), lumbar 08/24/2017   Formatting of this note might be different from the original. 2018: surgery   Diabetes mellitus    Dyslipidemia 08/09/2015   Family history of premature coronary artery disease 09/17/2018   Formatting of this note might be different from the original. Brother   Hemiparesis of right dominant side ([place]) 07/24/2020   Formatting of this note might be different from the original. 2021: left occ-temp hemorrhage   High blood pressure    High cholesterol    Hypertension 10/02/2015   Intracranial bleeding ([place]) 06/28/2020   Intraparenchymal hemorrhage of brain ([place]) 06/23/2020   Low vitamin B12 level 07/24/2020   Formatting of this note might be different from the original. 2021: 236   Mixed hyperlipidemia 01/21/2016   Formatting of this note might be different from the original. 01/2015 Not at goal LDL 79 HDL 26 (LDL 70 HDL 40)   Obesity (BMI 30-39.9) 11/01/2020   Permanent atrial fibrillation ([place]) 01/22/2016   Formatting of this note might be different from the original. Managed CARDS 2014: DX CHADS2=2 RX ACT   Presence of Watchman left atrial appendage closure device 10/06/2020   Proteinuria 11/01/2020   Status post coronary artery bypass graft 05/11/2020   Stroke ([place])    Subclinical hypothyroidism 07/30/2020  Swelling of both lower extremities 06/28/2020   TIA (transient ischemic attack) 08/21/2015   Type 2 diabetes mellitus with stage 3b chronic kidney disease, without long-term current use of insulin ([place]) 01/21/2016   Overview:  04/2015 A1C has increased to 7.6   Uncontrolled hypertension 02/07/2021   Weight loss    Wellness examination 03/09/2020    Past Surgical History:  Procedure Laterality Date   ARTERIAL  BYPASS SURGRY     CARDIAC SURGERY     CORONARY ARTERY BYPASS GRAFT     CORONARY STENT PLACEMENT     LEFT ATRIAL APPENDAGE OCCLUSION  10/09/2020    Current Medications: Current Meds  Medication Sig   amLODipine (NORVASC) 10 MG tablet Take 1 tablet by mouth once daily (Patient taking differently: Take 10 mg by mouth daily.)   aspirin 325 MG tablet Take 325 mg by mouth daily.   atorvastatin (LIPITOR) 80 MG tablet Take 80 mg by mouth daily.    carvedilol (COREG) 12.5 MG tablet TAKE 1 TABLET BY MOUTH TWICE DAILY WITH A MEAL FOR 90 DAYS (Patient taking differently: Take 12.5 mg by mouth 2 (two) times daily with a meal.)   dapagliflozin propanediol (FARXIGA) 5 MG TABS tablet Take 5 mg by mouth daily.   EPINEPHrine 0.3 mg/0.3 mL IJ SOAJ injection Inject 0.3 mg into the muscle as needed for anaphylaxis.   ergocalciferol (VITAMIN D2) 1.25 MG (50000 UT) capsule Take 1 capsule by mouth once a week.   ezetimibe (ZETIA) 10 MG tablet Take 1 tablet by mouth once daily (Patient taking differently: Take 10 mg by mouth daily.)   furosemide (LASIX) 40 MG tablet Take 1 tablet (40 mg total) by mouth 2 (two) times daily.   glimepiride (AMARYL) 4 MG tablet Take 2 mg by mouth daily with breakfast.   hydrALAZINE (APRESOLINE) 100 MG tablet Take 1 tablet (100 mg total) by mouth 3 (three) times daily.   levothyroxine (SYNTHROID) 50 MCG tablet Take 50 mcg by mouth daily before breakfast.   metFORMIN (GLUCOPHAGE) 1000 MG tablet Take 1,000 mg by mouth daily with breakfast.   Multiple Vitamin (MULTI-VITAMINS) TABS Take 1 tablet by mouth daily. Unknown strehgth   nitroGLYCERIN (NITROSTAT) 0.4 MG SL tablet Place 0.4 mg under the tongue every 5 (five) minutes as needed for chest pain.   [DISCONTINUED] aspirin EC 81 MG tablet Take 81 mg by mouth daily.      Allergies:   Bee venom   Social History   Socioeconomic History   Marital status: Married    Spouse name: Pat   Number of children: 2   Years of education: Not on  file   Highest education level: Associate degree: occupational, Hotel manager, or vocational program  Occupational History   Occupation: retired  Tobacco Use   Smoking status: Former    Types: Cigarettes    Quit date: 02/25/1996    Years since quitting: 26.6   Smokeless tobacco: Former  Scientific laboratory technician Use: Never used  Substance and Sexual Activity   Alcohol use: No   Drug use: No   Sexual activity: Not on file  Other Topics Concern   Not on file  Social History Narrative   Patient is left-handed. He lives with his wife in a one level home. He drinks 2 cups of coffee a day and an occasional diet soda. He does not regularly exercise.   Social Determinants of Health   Financial Resource Strain: Not on file  Food Insecurity: Not on file  Transportation Needs: Not on file  Physical Activity: Not on file  Stress: Not on file  Social Connections: Not on file     Family History: The patient's family history includes Cancer in his sister; Diabetes in his brother, mother, and sister; Heart attack in his brother; Heart disease in his brother and sister; Heart disease (age of onset: 43) in his mother; High blood pressure in his brother and mother; Other (age of onset: 74) in his father. ROS:   Please see the history of present illness.    All 14 point review of systems negative except as described per history of present illness  EKGs/Labs/Other Studies Reviewed:      Recent Labs: No results found for requested labs within last 365 days.  Recent Lipid Panel No results found for: "CHOL", "TRIG", "HDL", "CHOLHDL", "VLDL", "LDLCALC", "LDLDIRECT"  Physical Exam:    VS:  BP (!) 150/76 (BP Location: Left Arm, Patient Position: Sitting)   Pulse 69   Ht '6\' 1"'$  (1.854 m)   Wt 237 lb 9.6 oz (107.8 kg)   SpO2 99%   BMI 31.35 kg/m     Wt Readings from Last 3 Encounters:  10/15/22 237 lb 9.6 oz (107.8 kg)  02/13/22 240 lb 9.6 oz (109.1 kg)  08/29/21 243 lb 3.2 oz (110.3 kg)      GEN:  Well nourished, well developed in no acute distress HEENT: Normal NECK: No JVD; No carotid bruits LYMPHATICS: No lymphadenopathy CARDIAC: RRR, no murmurs, no rubs, no gallops RESPIRATORY:  Clear to auscultation without rales, wheezing or rhonchi  ABDOMEN: Soft, non-tender, non-distended MUSCULOSKELETAL:  No edema; No deformity  SKIN: Warm and dry LOWER EXTREMITIES: no swelling NEUROLOGIC:  Alert and oriented x 3 PSYCHIATRIC:  Normal affect   ASSESSMENT:    1. Coronary artery disease involving native coronary artery of native heart without angina pectoris   2. Primary hypertension   3. Permanent atrial fibrillation ([place])   4. Presence of Watchman left atrial appendage closure device    PLAN:    In order of problems listed above:  Recent stroke, frustrating situation he already got for strokes he did receive occlusive device.  We augmented his antiplatelet therapy with aspirin from 81 mg daily to 325.  Since that time he seems to be doing well.  Another issue is to control his risk factors the best possible.  His blood pressure is elevated today, however, his wife showed me a record of his blood pressure at home it is always good.  The key also will be to reduce his cholesterol. Dyslipidemia: I did review his lab work which show HDL 22 LDL 52.  He is on high intensity statin form of Lipitor 80 which I will continue. Diabetes his hemoglobin A1c from summer is 7.6.  There is some room for improvement. Presence of Watchman device noted. Permanent atrial fibrillation.  Noted I did review [facility] for this visit   Medication Adjustments/Labs and Tests Ordered: Current medicines are reviewed at length with the patient today.  Concerns regarding medicines are outlined above.  No orders of the defined types were placed in this encounter.  Medication changes: No orders of the defined types were placed in this encounter.   Signed, Park Liter, MD,  [facility] 10/15/2022 2:57 PM    [facility]

## 2022-10-15 NOTE — Patient Instructions (Signed)

## 2023-01-08 ENCOUNTER — Other Ambulatory Visit: Payer: Self-pay | Admitting: Cardiology

## 2023-01-10 ENCOUNTER — Other Ambulatory Visit: Payer: Self-pay | Admitting: Cardiology

## 2023-01-13 ENCOUNTER — Telehealth: Payer: Self-pay | Admitting: Cardiology

## 2023-01-13 ENCOUNTER — Other Ambulatory Visit: Payer: Self-pay | Admitting: Cardiology

## 2023-01-13 NOTE — Telephone Encounter (Signed)
*  STAT* If patient is at the pharmacy, call can be transferred to refill team.   1. Which medications need to be refilled? (please list name of each medication and dose if known) amLODipine (NORVASC) 10 MG tablet   2. Which pharmacy/location (including street and city if local pharmacy) is medication to be sent to?  [place], [place] [address]    3. Do they need a 30 day or 90 day supply? 90 day

## 2023-04-06 NOTE — Progress Notes (Signed)
 Stroke Follow up  Subjective   Today's Follow up Visit: Mon 04/06/2023 Last Visit:  08/11/2022  Hospital admission: Aug 2023 PCP: Ofilia Lamar Carmelia Mickey., MD CC: stroke follow up   [name]. is a 72 y.o.  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    Current status:  Patient is here today with wife. He remains very active; works on [employer], garden,ing keeping himself  busy, making breakfast for her wife.  No new stroke/TIA symptoms Reports that he continues to have left hand weakness and numbness, left foot numbness, slurred speech, makes intermittent paraphasic errors. Overall his symptoms art stable. Completed [facility] rehab.  Denies falls.  Currently on ASA 325mg , previously was on Plavix post LAAC.  Continues to monitor BP at home 130-150, currently on Norvasc  10mg , Coreg  12.5mg  BID and Hydralazine  50mg  BID   Stroke History  07/2022 ; Left corona radiata infarction.He was treated at [facility] and his ASA was increased to 325mg  daily.  09/2020: LAAO 08/2020: Left parietal lobe infarction while off anticoagulation. 06/2020:  Left occipital IPH while on Eliquis  for Afib. 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 DOAC discontinued.    Stroke Work Up MRI brain (07/15/22) : Small left corona radiata infarction  CTA (08/01/22) : Moderate bilateral carotid stenosis (left >right), mild to moderate bilateral intracranial cavernous carotid stenosis MRI Brain (08/16/20): L MCA infraction, resolved left occipital lobe IPH, multiple foci of chronic microhemorrhages.  Redemonstrated sclerotic lesion in the left parietal bone no enhancement, may represent benign focal sclerosis. 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 CTA (06/19/2020): Mild bilateral carotid stenosis, mild to moderate stenosis of right P2 TEE (11/19/2020): Normal LVEF, LAAO device is well-seated without significant peridevice leak; small peridevice leak, measured 0.35 mm in diameter.    Lab Results  Component Value Date   LDL 52 07/16/2022   LDL 79 01/30/2015      Component Value Date/Time   HDL 22 (L) 07/16/2022 0017   HDL 26 01/30/2015 0847      Component Value Date/Time   HBA1C 7.6 (H) 07/15/2022 0937   11/05/22 HX LDL CHOLESTEROL DIRECT <100 mg/dL 63   HX HEMOGLOBIN J8R, POC 4.1 - 5.7 % 7.8 Abnormal     The following portions of the patient's history were reviewed and updated as appropriate: allergies, current medications, PMH, PSH, social history, family history and problem list.    Review of Systems:   A complete ROS was performed with pertinent positives/negatives noted in the HPI. The remainder of the ROS are negative.     Objective:   Vitals:   04/06/23 1246  BP: 154/88  Pulse: 77  Temp: 97.5 F (36.4 C)  SpO2: 98%     General: No apparent distress.  Head-Eyes-Ears-Nose-Throat: Oropharynx clear, no lymphadenopathy.   Cardiovascular: Regular rate and rhythm  Chest/Lungs: Clear to auscultation  Abdomen: Abdomen soft, nondistended, nontender.   Extremities: 2+ peripheral pulses. No edema  Other:    Neuro:   Mental Status: Alert, oriented. Intermittent mixed aphasia. Able to follow simple commands. Rare Paraphasic errors. Able to repeat phrases, trouble with repeat the entire sentence, mild dysarthria. Able recall 3 words  Cranial Nerves   II  Visual Fields: full to confrontation  III, IV, VI: Pupils equal and reactive to light and near. extraocular muscles intact.  V Facial Sensation: Normal. No weakness of masticatory muscles  VII: Face symmetric  VIII Auditory Acuity: Grossly normal  IX/X: The uvula is midline; the palate elevates symmetrically  XI: Normal  sternocleidomastoid and trapezius strength  XII: The tongue does not deviate     Motor System: Strength: Motor intact, no significant bilateral weakness tone: WNL  Coordination:  Decrease finger tapping on right,  intact finger-to-nose with respect to strength.   Reflexes: DTRs: 2+ throughout  Gait:  Unsteady gait, decrease arm swing over the right arm   Sensory Examination: no sensory deficits noted Extinction: none  Other:     Assessments: MODIFIED RANKIN SCALE = 2   0 - No symptoms.  1 - No significant disability. Able to carry out all usual activities, despite some symptoms.  2 - Slight disability. Able to look after own affairs without assistance, but unable to carry out all previous activities.  3 - Moderate disability. Requires some help, but able to walk unassisted.  4 - Moderately severe disability. Unable to attend to own bodily needs without assistance, and unable to walk unassisted.  5 - Severe disability. Requires constant nursing care and attention, bedridden, incontinent.      Assessment:   Robert Spears. is a 72 y.o.  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   L MCA infarction: likely 2/2 Afib not on AC  L corona radiata stroke (Aug 2023) : Small vessel disease    Plan   Left Occipital IPH (2021) Left Parietal Infarction (2021) Left corona radiata infarction (2023) Afib s/p LAAO (2021) (LAAO device is well-seated without significant peridevice leak; small peridevice leak, measured 0.35 mm in diameter) Left ICA moderate stenosis  Intracranial B/L carotid moderate stenosis HTN  HLD  DM - Continue  ASA 325mg  daily, discussed increased risk of hemorrhage given the 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  Signs and symptoms of Stroke and when to call 911 such as any new numbness, weakness, dysarthria, dysphagia, aphasia, dizziness, diplopia, vision loss, or balance problems    Greater than 50% of the 30  minute visit was spent in counseling regarding post stroke depression, stroke risk factors, symptom awareness and prevention, and in coordination of care for the patient.   Lissa Curb, MD 04/06/2023

## 2023-04-21 ENCOUNTER — Telehealth: Payer: Self-pay | Admitting: Cardiology

## 2023-04-21 MED ORDER — HYDRALAZINE HCL 50 MG PO TABS: 50.0000 mg | ORAL_TABLET | Freq: Two times a day (BID) | ORAL | 0 refills | Status: DC

## 2023-04-21 NOTE — Telephone Encounter (Signed)
Returned call to patient spoke with wife Elease Hashimoto who states that Hydralazine was decreased back in August while at [facility]. Wife request the 50 mg tablets due to tablet crumbling so much when breaking the 100 mg. Rx sent to patients pharmacy [facility] in [place] as requested. Patient also due for follow up appointment which was also made. Appointment reminder mailed to patients address on file verified with wife

## 2023-04-21 NOTE — Telephone Encounter (Signed)
Pt c/o medication issue:  1. Name of Medication: hydrALAZINE (APRESOLINE) 100 MG tablet   2. How are you currently taking this medication (dosage and times per day)? 50 mg 2 x daily -- it was changed 07/2022  3. Are you having a reaction (difficulty breathing--STAT)?   4. What is your medication issue? Patient's wife states that this medication was changed and would like it called in for more refills sent to:    [id number] - [place],  - [address]

## 2023-06-03 ENCOUNTER — Encounter: Payer: Self-pay | Admitting: Cardiology

## 2023-06-03 ENCOUNTER — Ambulatory Visit: Payer: PPO | Attending: Cardiology | Admitting: Cardiology

## 2023-06-03 VITALS — BP 140/60 | HR 68 | Ht 73.0 in | Wt 226.0 lb

## 2023-06-03 DIAGNOSIS — E1122 Type 2 diabetes mellitus with diabetic chronic kidney disease: Secondary | ICD-10-CM

## 2023-06-03 DIAGNOSIS — I509 Heart failure, unspecified: Secondary | ICD-10-CM

## 2023-06-03 DIAGNOSIS — I10 Essential (primary) hypertension: Secondary | ICD-10-CM | POA: Diagnosis not present

## 2023-06-03 DIAGNOSIS — Z7984 Long term (current) use of oral hypoglycemic drugs: Secondary | ICD-10-CM

## 2023-06-03 DIAGNOSIS — E785 Hyperlipidemia, unspecified: Secondary | ICD-10-CM

## 2023-06-03 DIAGNOSIS — I2510 Atherosclerotic heart disease of native coronary artery without angina pectoris: Secondary | ICD-10-CM

## 2023-06-03 DIAGNOSIS — I4821 Permanent atrial fibrillation: Secondary | ICD-10-CM

## 2023-06-03 DIAGNOSIS — N1832 Chronic kidney disease, stage 3b: Secondary | ICD-10-CM

## 2023-06-03 MED ORDER — CARVEDILOL 25 MG PO TABS: 25.0000 mg | ORAL_TABLET | Freq: Two times a day (BID) | ORAL | 3 refills | Status: DC

## 2023-06-03 MED ORDER — HYDRALAZINE HCL 50 MG PO TABS: 50.0000 mg | ORAL_TABLET | Freq: Three times a day (TID) | ORAL | 3 refills | Status: DC

## 2023-06-03 NOTE — Patient Instructions (Addendum)
Medication Instructions:   INCREASE: Carvedilol to 25mg  twice daily- You may double your current dose and your next refill will reflect your new dose.  INCREASE: Hydralazine to 50mg  three times daily   Lab Work: None Ordered If you have labs (blood work) drawn today and your tests are completely normal, you will receive your results only by: MyChart Message (if you have MyChart) OR A paper copy in the mail If you have any lab test that is abnormal or we need to change your treatment, we will call you to review the results.   Testing/Procedures: None Ordered   Follow-Up: At [facility], you and your health needs are our priority.  As part of our continuing mission to provide you with exceptional heart care, we have created designated Provider Care Teams.  These Care Teams include your primary Cardiologist (physician) and Advanced Practice Providers (APPs -  Physician Assistants and Nurse Practitioners) who all work together to provide you with the care you need, when you need it.  We recommend signing up for the patient portal called "MyChart".  Sign up information is provided on this After Visit Summary.  MyChart is used to connect with patients for Virtual Visits (Telemedicine).  Patients are able to view lab/test results, encounter notes, upcoming appointments, etc.  Non-urgent messages can be sent to your provider as well.   To learn more about what you can do with MyChart, go to [email or web address].    Your next appointment:   6 month(s)  The format for your next appointment:   In Person  Provider:   Gypsy Balsam, MD    Other Instructions NA

## 2023-06-03 NOTE — Progress Notes (Signed)
Cardiology Office Note:    Date:  06/03/2023   ID:  Robert Spears, DOB Dec 23, 1950, MRN [id number]  PCP:  Robert Bass, MD  Cardiologist:  Robert Balsam, MD    Referring MD: Robert Bass, MD   Chief Complaint  Patient presents with   Medication Management    Carvedilol changed to 25mg  bid per Neurologist(Robert Spears)    History of Present Illness:    Robert Spears is a 72 y.o. male  with past medical history significant for coronary artery disease, status post coronary bypass graft many years ago, permanent atrial fibrillation. He used to be anticoagulated but developed intracranial bleed in 2021 anticoagulation has been withdrawn and he received Watchman device, also essential hypertension, dyslipidemia, diabetes he comes today to my office for follow-up he suffered another stroke. He was managed by [facility] health  Comes to months for follow-up.  Doing well.  Like always he comes with his wife.  He is aphasic to some degree so conversation will be difficult with him however she tells me that he is quite active he will make breakfast for her every morning.  He also does do some work in the garden.  No chest pain tightness squeezing pressure burning chest.  She described to me his issue with dental problems he ended up having a large dental abscess requiring multiple interventions but likely things settle down the right now.  Past Medical History:  Diagnosis Date   A-fib [facility])    Acute laryngotracheitis 11/05/2020   Formatting of this note might be different from the original. 11/05/2020   Allergy to bee sting    Benign hypertension with CKD (chronic kidney disease) stage III (HCC) 03/22/2019   Cerebral amyloid angiopathy (HCC) 01/22/2016   Formatting of this note might be different from the original. 2016: RIGHT ARM NUMB, RIGHT FACE WEAK, EXP DYSPHASIA; MOD CAROTID PLAQUE, AFIB 2018: residual numbness fingers right hand 2021: left occ-temp hemorrhage on NOAC with ex/rec  dysphasia, lower facial droop, LUE weakness   Chronic bilateral low back pain without sciatica 08/01/2016   Formatting of this note might be different from the original. 2018: surg   Coronary artery disease 01/21/2016   Formatting of this note might be different from the original. Managed CARDS (1997) onset (2005) PCI of RCA, PCI with DES of RCA  post CABS  Formatting of this note might be different from the original. Managed CARDS (1997) onset (2005) PCI of RCA, PCI with DES of RCA  post CABG   Coronary artery disease involving native coronary artery of native heart without angina pectoris 08/09/2015   DDD (degenerative disc disease), lumbar 08/24/2017   Formatting of this note might be different from the original. 2018: surgery   Diabetes mellitus    Dyslipidemia 08/09/2015   Family history of premature coronary artery disease 09/17/2018   Formatting of this note might be different from the original. Brother   Hemiparesis of right dominant side (HCC) 07/24/2020   Formatting of this note might be different from the original. 2021: left occ-temp hemorrhage   High blood pressure    High cholesterol    Hypertension 10/02/2015   Intracranial bleeding (HCC) 06/28/2020   Intraparenchymal hemorrhage of brain (HCC) 06/23/2020   Low vitamin B12 level 07/24/2020   Formatting of this note might be different from the original. 2021: 236   Mixed hyperlipidemia 01/21/2016   Formatting of this note might be different from the original. 01/2015 Not at goal LDL 79 HDL  26 (LDL 70 HDL 40)   Obesity (BMI 30-39.9) 11/01/2020   Permanent atrial fibrillation (HCC) 01/22/2016   Formatting of this note might be different from the original. 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   Swelling of both lower extremities 06/28/2020    TIA (transient ischemic attack) 08/21/2015   Type 2 diabetes mellitus with stage 3b chronic kidney disease, without long-term current use of insulin (HCC) 01/21/2016   Overview:  04/2015 A1C has increased to 7.6   Uncontrolled hypertension 02/07/2021   Weight loss    Wellness examination 03/09/2020    Past Surgical History:  Procedure Laterality Date   ARTERIAL BYPASS SURGRY     CARDIAC SURGERY     CORONARY ARTERY BYPASS GRAFT     CORONARY STENT PLACEMENT     LEFT ATRIAL APPENDAGE OCCLUSION  10/09/2020    Current Medications: Current Meds  Medication Sig   amLODipine (NORVASC) 10 MG tablet Take 1 tablet by mouth once daily (Patient taking differently: Take 10 mg by mouth daily.)   aspirin 325 MG tablet Take 325 mg by mouth daily.   atorvastatin (LIPITOR) 80 MG tablet Take 80 mg by mouth daily.    carvedilol (COREG) 25 MG tablet Take 1 tablet (25 mg total) by mouth 2 (two) times daily.   dapagliflozin propanediol (FARXIGA) 5 MG TABS tablet Take 5 mg by mouth daily.   EPINEPHrine 0.3 mg/0.3 mL IJ SOAJ injection Inject 0.3 mg into the muscle as needed for anaphylaxis.   ergocalciferol (VITAMIN D2) 1.25 MG (50000 UT) capsule Take 1 capsule by mouth once a week.   ezetimibe (ZETIA) 10 MG tablet Take 1 tablet by mouth once daily (Patient taking differently: Take 10 mg by mouth daily.)   furosemide (LASIX) 40 MG tablet Take 1 tablet (40 mg total) by mouth 2 (two) times daily.   glimepiride (AMARYL) 4 MG tablet Take 2 mg by mouth daily with breakfast.   levothyroxine (SYNTHROID) 50 MCG tablet Take 50 mcg by mouth daily before breakfast.   metFORMIN (GLUCOPHAGE) 1000 MG tablet Take 1,000 mg by mouth daily with breakfast.   Multiple Vitamin (MULTI-VITAMINS) TABS Take 1 tablet by mouth daily. Unknown strehgth   nitroGLYCERIN (NITROSTAT) 0.4 MG SL tablet Place 0.4 mg under the tongue every 5 (five) minutes as needed for chest pain.   [DISCONTINUED] carvedilol (COREG) 12.5 MG tablet Take 1 tablet  (12.5 mg total) by mouth 2 (two) times daily with a meal. (Patient taking differently: Take 25 mg by mouth 2 (two) times daily with a meal.)   [DISCONTINUED] hydrALAZINE (APRESOLINE) 50 MG tablet Take 1 tablet (50 mg total) by mouth 2 (two) times daily.     Allergies:   Bee venom   Social History   Socioeconomic History   Marital status: Married    Spouse name: Pat   Number of children: 2   Years of education: Not on file   Highest education level: Associate degree: occupational, Scientist, product/process development, or vocational program  Occupational History   Occupation: retired  Tobacco Use   Smoking status: Former    Types: Cigarettes    Quit date: 02/25/1996    Years since quitting: 27.2   Smokeless tobacco: Former  Building services engineer Use: Never used  Substance and Sexual Activity   Alcohol  use: No   Drug use: No   Sexual activity: Not on file  Other Topics Concern   Not on file  Social History Narrative   Patient is left-handed. He lives with his wife in a one level home. He drinks 2 cups of coffee a day and an occasional diet soda. He does not regularly exercise.   Social Determinants of Health   Financial Resource Strain: Not on file  Food Insecurity: Not on file  Transportation Needs: Not on file  Physical Activity: Not on file  Stress: Not on file  Social Connections: Not on file     Family History: The patient's family history includes Cancer in his sister; Diabetes in his brother, mother, and sister; Heart attack in his brother; Heart disease in his brother and sister; Heart disease (age of onset: 56) in his mother; High blood pressure in his brother and mother; Other (age of onset: 15) in his father. ROS:   Please see the history of present illness.    All 14 point review of systems negative except as described per history of present illness  EKGs/Labs/Other Studies Reviewed:      Recent Labs: No results found for requested labs within last 365 days.  Recent Lipid Panel No  results found for: "CHOL", "TRIG", "HDL", "CHOLHDL", "VLDL", "LDLCALC", "LDLDIRECT"  Physical Exam:    VS:  BP (!) 140/60 (BP Location: Left Arm, Patient Position: Sitting)   Pulse 68   Ht 6\' 1"  (1.854 m)   Wt 226 lb (102.5 kg)   SpO2 94%   BMI 29.82 kg/m     Wt Readings from Last 3 Encounters:  06/03/23 226 lb (102.5 kg)  10/15/22 237 lb 9.6 oz (107.8 kg)  02/13/22 240 lb 9.6 oz (109.1 kg)     GEN:  Well nourished, well developed in no acute distress HEENT: Normal NECK: No JVD; No carotid bruits LYMPHATICS: No lymphadenopathy CARDIAC: Irregular area lower, no murmurs, no rubs, no gallops RESPIRATORY:  Clear to auscultation without rales, wheezing or rhonchi  ABDOMEN: Soft, non-tender, non-distended MUSCULOSKELETAL:  No edema; No deformity  SKIN: Warm and dry LOWER EXTREMITIES: no swelling NEUROLOGIC:  Alert and oriented x 3 PSYCHIATRIC:  Normal affect   ASSESSMENT:    1. Coronary artery disease involving native coronary artery of native heart without angina pectoris   2. Permanent atrial fibrillation (HCC)   3. Primary hypertension   4. Chronic congestive heart failure, unspecified heart failure type (HCC)   5. Type 2 diabetes mellitus with stage 3b chronic kidney disease, without long-term current use of insulin (HCC)   6. Dyslipidemia    PLAN:    In order of problems listed above:  Coronary artery disease stable from that point review on appropriate medications which I will continue. Permanent atrial fibrillation recently dose of Cardizem has been increased in spite of that rate is sitting still seems to be nicely controlled no bradycardia. Essential hypertension uncontrolled.  I will increase dose of hydralazine to 50 mg 3 times a day. Chronic diastolic congestive heart failure stable compensated. Dyslipidemia on appropriate medications which I will continue, I did review laboratory test last LDL was 63 from 7 months ago.   Medication Adjustments/Labs and Tests  Ordered: Current medicines are reviewed at length with the patient today.  Concerns regarding medicines are outlined above.  Orders Placed This Encounter  Procedures   EKG 12-Lead   Medication changes:  Meds ordered this encounter  Medications   hydrALAZINE (APRESOLINE) 50 MG  tablet    Sig: Take 1 tablet (50 mg total) by mouth 3 (three) times daily.    Dispense:  270 tablet    Refill:  3   carvedilol (COREG) 25 MG tablet    Sig: Take 1 tablet (25 mg total) by mouth 2 (two) times daily.    Dispense:  180 tablet    Refill:  3    Signed, Georgeanna Lea, MD, [facility] 06/03/2023 11:43 AM    [facility]

## 2023-06-29 ENCOUNTER — Other Ambulatory Visit: Payer: Self-pay | Admitting: Cardiology

## 2023-08-21 DIAGNOSIS — F418 Other specified anxiety disorders: Secondary | ICD-10-CM | POA: Insufficient documentation

## 2023-10-01 ENCOUNTER — Other Ambulatory Visit: Payer: Self-pay | Admitting: Cardiology

## 2023-10-07 ENCOUNTER — Other Ambulatory Visit: Payer: Self-pay | Admitting: Cardiology

## 2023-10-19 ENCOUNTER — Other Ambulatory Visit: Payer: Self-pay | Admitting: Cardiology

## 2023-11-05 ENCOUNTER — Telehealth: Payer: Self-pay | Admitting: Cardiology

## 2023-11-05 ENCOUNTER — Other Ambulatory Visit: Payer: Self-pay | Admitting: Cardiology

## 2023-11-05 MED ORDER — CARVEDILOL 25 MG PO TABS: 25.0000 mg | ORAL_TABLET | Freq: Two times a day (BID) | ORAL | 3 refills | Status: DC

## 2023-11-05 NOTE — Telephone Encounter (Signed)
*  STAT* If patient is at the pharmacy, call can be transferred to refill team.   1. Which medications need to be refilled? (please list name of each medication and dose if known)   carvedilol (COREG) 25 MG tablet (Expired)    2. Which pharmacy/location (including street and city if local pharmacy) is medication to be sent to? [facility] - [place], [place] - [address]    3. Do they need a 30 day or 90 day supply? 90 day

## 2023-12-31 ENCOUNTER — Other Ambulatory Visit: Payer: Self-pay | Admitting: Cardiology

## 2023-12-31 NOTE — Telephone Encounter (Signed)
Rx refill sent to pharmacy. 

## 2024-02-03 ENCOUNTER — Ambulatory Visit: Payer: PPO | Admitting: Cardiology

## 2024-02-11 ENCOUNTER — Ambulatory Visit: Payer: PPO | Admitting: Cardiology

## 2024-02-15 ENCOUNTER — Ambulatory Visit: Payer: PPO | Attending: Cardiology | Admitting: Cardiology

## 2024-02-15 ENCOUNTER — Encounter: Payer: Self-pay | Admitting: *Deleted

## 2024-02-15 ENCOUNTER — Encounter: Payer: Self-pay | Admitting: Cardiology

## 2024-02-15 ENCOUNTER — Telehealth: Payer: Self-pay | Admitting: Cardiology

## 2024-02-15 VITALS — BP 146/80 | HR 79 | Ht 73.0 in | Wt 201.0 lb

## 2024-02-15 DIAGNOSIS — I2510 Atherosclerotic heart disease of native coronary artery without angina pectoris: Secondary | ICD-10-CM

## 2024-02-15 DIAGNOSIS — I6523 Occlusion and stenosis of bilateral carotid arteries: Secondary | ICD-10-CM

## 2024-02-15 DIAGNOSIS — Z95818 Presence of other cardiac implants and grafts: Secondary | ICD-10-CM

## 2024-02-15 DIAGNOSIS — I10 Essential (primary) hypertension: Secondary | ICD-10-CM

## 2024-02-15 DIAGNOSIS — I4821 Permanent atrial fibrillation: Secondary | ICD-10-CM | POA: Diagnosis not present

## 2024-02-15 DIAGNOSIS — E785 Hyperlipidemia, unspecified: Secondary | ICD-10-CM

## 2024-02-15 NOTE — Progress Notes (Signed)
 Cardiology Office Note:  .   Date:  02/15/2024  ID:  Robert Spears, DOB 09-May-1951, MRN [id number] PCP: Olive Bass, MD  [facility] Providers Cardiologist:  Gypsy Balsam, MD    History of Present Illness: .   Robert Spears is a 73 y.o. male with a past medical history of permanent atrial fibrillation s/p Watchman device, HTN, CKD, multiple CVAs with right-sided defecit, CAD s/p CABG, DM2, subclinical hypothyroidism, carotid artery stenosis.  07/15/2022 echo EF 65 to 70%, mild concentric LVH, trace MR 07/15/2022 CTA of the head neck left ICA 40 to 50% stenosis, right ICA less than 50% stenosis 05/07/2021 echo mild concentric LVH, EF 55 to 60%, mild aortic valve sclerosis, mild to moderate MR, mild TR 10/05/2020 LAA echo procedure with Watchman device 05/31/2020 echo EF 60 to 65%, mild LVH, LA moderately dilated, mild MR, mild dilatation of the ascending aorta 42 mm 12/21/2018 carotid duplex mild bilateral carotid artery stenosis  Most recently evaluated by Dr. Bing Matter on 06/03/2023, was stable from a cardiac perspective although he had recently suffered another stroke.  His blood pressure was slightly elevated and his hydralazine dose was increased.  He was advised to follow-up in 6 months.  He presents today for follow-up of his CAD and atrial fibrillation accompanied by his wife.  He has expressive and receptive aphasia, his wife provides many of his answers for him.  He has been doing well from a cardiac perspective, no formal complaints.  He did recently have a fall but is in the setting of being on unstable terrain outside and throwing something.  He has continued to lose weight, approximately 50 pounds over the last few years, this is somewhat concerning to his wife.  She does state he has a good appetite but sometimes has to be prompted to eat.  We did discuss that he is on Rybelsus and this could contribute to his weight loss.  They follow closely with her PCP and see him  every 3 months, encouraged her to discuss weight loss with his PCP when they see him again if it continues to be a concern. He denies chest pain, palpitations, dyspnea, pnd, orthopnea, n, v, dizziness, syncope, edema, weight gain, or early satiety.   ROS: Review of Systems  Constitutional:  Positive for weight loss.  Neurological:        Expressive and receptive aphasia  All other systems reviewed and are negative.     Studies Reviewed: .        Cardiac Studies & Procedures   ______________________________________________________________________________________________     ECHOCARDIOGRAM  ECHOCARDIOGRAM COMPLETE 05/31/2020  Narrative ECHOCARDIOGRAM REPORT    Patient Name:   Robert Spears Date of Exam: 05/31/2020 Medical Rec #:  [id number]         Height:       73.0 in Accession #:    [id number]        Weight:       259.4 lb Date of Birth:  1951-01-03         BSA:          2.403 m Patient Age:    69 years          BP:           150/96 mmHg Patient Gender: M                 HR:           89 bpm. Exam Location:    Procedure: 2D Echo  Indications:    Atrial Fibrillation 427.31 / I48.91  History:        Patient has no prior history of Echocardiogram examinations. CAD, TIA; Risk Factors:Diabetes and Dyslipidemia.  Sonographer:    Louie Boston Referring Phys: [id number] ROBERT J KRASOWSKI  IMPRESSIONS   1. Left ventricular ejection fraction, by estimation, is 60 to 65%. The left ventricle has normal function. The left ventricle has no regional wall motion abnormalities. There is mild left ventricular hypertrophy. 2. Left atrial size was moderately dilated. 3. The mitral valve is normal in structure. Mild mitral valve regurgitation. No evidence of mitral stenosis. 4. There is mild dilatation of the ascending aorta measuring 42 mm.  FINDINGS Left Ventricle: Left ventricular ejection fraction, by estimation, is 60 to 65%. The left ventricle has normal function. The left  ventricle has no regional wall motion abnormalities. The left ventricular internal cavity size was normal in size. There is mild left ventricular hypertrophy. Left ventricular diastolic parameters are indeterminate.  Right Ventricle: The right ventricular size is normal. No increase in right ventricular wall thickness. Right ventricular systolic function is normal. There is normal pulmonary artery systolic pressure. The tricuspid regurgitant velocity is 2.35 m/s, and with an assumed right atrial pressure of 3 mmHg, the estimated right ventricular systolic pressure is 25.1 mmHg.  Left [facility]: Left atrial size was moderately dilated.  Right [facility]: Right atrial size was normal in size.  Pericardium: There is no evidence of pericardial effusion.  Mitral Valve: The mitral valve is normal in structure. Normal mobility of the mitral valve leaflets. Mild mitral valve regurgitation. No evidence of mitral valve stenosis.  Tricuspid Valve: The tricuspid valve is normal in structure. Tricuspid valve regurgitation is not demonstrated. No evidence of tricuspid stenosis.  Aortic Valve: The aortic valve is normal in structure. Aortic valve regurgitation is not visualized. No aortic stenosis is present.  Pulmonic Valve: The pulmonic valve was normal in structure. Pulmonic valve regurgitation is not visualized. No evidence of pulmonic stenosis.  Aorta: The aortic root is normal in size and structure. There is mild dilatation of the ascending aorta measuring 42 mm.  Venous: The inferior vena cava is normal in size with greater than 50% respiratory variability, suggesting right atrial pressure of 3 mmHg.  IAS/Shunts: No atrial level shunt detected by color flow Doppler.   LEFT VENTRICLE PLAX 2D LVIDd:         5.20 cm     Diastology LVIDs:         3.75 cm     LV e' lateral:   5.74 cm/s LV PW:         1.40 cm     LV E/e' lateral: 17.8 LV IVS:        1.50 cm     LV e' medial:    6.08 cm/s LVOT diam:      2.20 cm     LV E/e' medial:  16.8 LV SV:         47 LV SV Index:   20 LVOT Area:     3.80 cm  LV Volumes (MOD) LV vol d, MOD A2C: 72.3 ml LV vol d, MOD A4C: 85.3 ml LV vol s, MOD A2C: 38.0 ml LV vol s, MOD A4C: 39.6 ml LV SV MOD A2C:     34.3 ml LV SV MOD A4C:     85.3 ml LV SV MOD BP:      40.2 ml  RIGHT VENTRICLE  IVC TAPSE (M-mode): 2.0 cm  IVC diam: 2.50 cm  LEFT ATRIUM              Index       RIGHT ATRIUM           Index LA diam:        5.50 cm  2.29 cm/m  RA Area:     18.80 cm LA Vol (A2C):   106.0 ml 44.11 ml/m RA Volume:   47.40 ml  19.72 ml/m LA Vol (A4C):   108.0 ml 44.94 ml/m LA Biplane Vol: 108.0 ml 44.94 ml/m AORTIC VALVE LVOT Vmax:   56.90 cm/s LVOT Vmean:  39.000 cm/s LVOT VTI:    0.124 m  AORTA Ao Asc diam:  3.80 cm Ao Desc diam: 3.30 cm  MITRAL VALVE                TRICUSPID VALVE MV Area (PHT): 4.89 cm     TR Peak grad:   22.1 mmHg MV Decel Time: 155 msec     TR Vmax:        235.00 cm/s MV E velocity: 102.00 cm/s MV A velocity: 34.90 cm/s   SHUNTS MV E/A ratio:  2.92         Systemic VTI:  0.12 m Systemic Diam: 2.20 cm  Belva Crome MD Electronically signed by Belva Crome MD Signature Date/Time: 05/31/2020/10:48:32 AM    Final          ______________________________________________________________________________________________      Risk Assessment/Calculations:    CHA2DS2-VASc Score = 6   This indicates a 9.7% annual risk of stroke. The patient's score is based upon: CHF History: 0 HTN History: 1 Diabetes History: 1 Stroke History: 2 Vascular Disease History: 1 Age Score: 1 Gender Score: 0         Physical Exam:   VS:  BP (!) 146/80 (BP Location: Left Arm, Patient Position: Sitting, Cuff Size: Normal)   Pulse 79   Ht 6\' 1"  (1.854 m)   Wt 201 lb (91.2 kg)   SpO2 99%   BMI 26.52 kg/m    Wt Readings from Last 3 Encounters:  02/15/24 201 lb (91.2 kg)  06/03/23 226 lb (102.5 kg)  10/15/22 237 lb 9.6 oz  (107.8 kg)    GEN: Well nourished, well developed in no acute distress NECK: No JVD; No carotid bruits CARDIAC: Irregularly irregular, no murmurs, rubs, gallops RESPIRATORY:  Clear to auscultation without rales, wheezing or rhonchi  ABDOMEN: Soft, non-tender, non-distended EXTREMITIES:  No edema; No deformity   ASSESSMENT AND PLAN: .   Permanent atrial fibrillation/hypercoagulable state/presence of Watchman device-his CHA2DS2-VASc score is 6, his rate is controlled at 79 bpm.  Continue Coreg 25 mg twice daily.  Hypertension-blood pressure is slightly elevated at 146/80, his wife keeps very close check on his blood pressure at home and presents a blood pressure log and overall it appears to be controlled.  I am concerned that if we tried with tighter blood pressure control it might result in orthostasis or falls as his balance is somewhat off following his multiple strokes.  For now, continue Apresoline 50 mg 3 times daily, continue Coreg 25 mg twice daily.  CAD-s/p CABG.  Continue aspirin on 325 mg daily, continue Coreg 25 mg twice daily, continue Lipitor 80 mg daily, continue Zetia 10 mg daily, continue nitroglycerin as needed.  Carotid artery stenosis-CTA of his head and neck in 2023 revealed up to 50% stenosis on his left internal carotid  artery, right carotid artery less than 50%.  Will repeat a carotid duplex for surveillance.  History of strokes-multiple strokes, follows with neurologist at [facility].  Currently on aspirin 325 mg daily.  Dyslipidemia-is followed by his PCP, currently on Zetia 10 mg daily, Lipitor 80 mg daily.  DM2 - followed by PCP.        Dispo: Carotid duplex, follow up in 6 months.   Signed, Flossie Dibble, NP

## 2024-02-15 NOTE — Patient Instructions (Signed)
 Medication Instructions:  Your physician recommends that you continue on your current medications as directed. Please refer to the Current Medication list given to you today.  *If you need a refill on your cardiac medications before your next appointment, please call your pharmacy*   Lab Work: None Ordered If you have labs (blood work) drawn today and your tests are completely normal, you will receive your results only by: MyChart Message (if you have MyChart) OR A paper copy in the mail If you have any lab test that is abnormal or we need to change your treatment, we will call you to review the results.   Testing/Procedures: None Ordered   Follow-Up: At [facility], you and your health needs are our priority.  As part of our continuing mission to provide you with exceptional heart care, we have created designated Provider Care Teams.  These Care Teams include your primary Cardiologist (physician) and Advanced Practice Providers (APPs -  Physician Assistants and Nurse Practitioners) who all work together to provide you with the care you need, when you need it.  We recommend signing up for the patient portal called "MyChart".  Sign up information is provided on this After Visit Summary.  MyChart is used to connect with patients for Virtual Visits (Telemedicine).  Patients are able to view lab/test results, encounter notes, upcoming appointments, etc.  Non-urgent messages can be sent to your provider as well.   To learn more about what you can do with MyChart, go to [email or web address].    Your next appointment:   6 month(s)  The format for your next appointment:   In Person  Provider:   Wallis Bamberg, NP   Other Instructions NA

## 2024-02-15 NOTE — Telephone Encounter (Signed)
 Left voicemail for wife, Dennie Bible to return call

## 2024-02-16 NOTE — Addendum Note (Signed)
 Addended by: Lonia Farber on: 02/16/2024 10:09 AM   Modules accepted: Orders

## 2024-02-16 NOTE — Telephone Encounter (Signed)
 Spoke to wife, Elease Hashimoto per [employer]. Reviewed Victorino Dike [facility] recommendation for patient to have a carotid US. Wife verbalized understanding and had no further questions.

## 2024-02-17 ENCOUNTER — Ambulatory Visit: Attending: Cardiology

## 2024-02-17 DIAGNOSIS — G459 Transient cerebral ischemic attack, unspecified: Secondary | ICD-10-CM | POA: Diagnosis not present

## 2024-02-17 DIAGNOSIS — I2510 Atherosclerotic heart disease of native coronary artery without angina pectoris: Secondary | ICD-10-CM | POA: Diagnosis not present

## 2024-02-18 ENCOUNTER — Telehealth: Payer: Self-pay | Admitting: Emergency Medicine

## 2024-02-18 NOTE — Telephone Encounter (Signed)
 Spoke to wife Dennie Bible, per D[employer]. Reviewed Wallis Bamberg, NP result note with Dennie Bible. Pat verbalized understanding and had no additional questions.

## 2024-02-18 NOTE — Telephone Encounter (Signed)
-----   Message from Flossie Dibble sent at 02/17/2024  3:21 PM EST ----- Mild, bilateral carotid artery stenosis. This is a stable, reassuring result. Continue with current meds.

## 2024-03-26 ENCOUNTER — Other Ambulatory Visit: Payer: Self-pay | Admitting: Cardiology

## 2024-05-24 ENCOUNTER — Other Ambulatory Visit: Payer: Self-pay

## 2024-05-24 MED ORDER — HYDRALAZINE HCL 50 MG PO TABS: 50.0000 mg | ORAL_TABLET | Freq: Three times a day (TID) | ORAL | 1 refills | Status: DC

## 2024-06-22 ENCOUNTER — Other Ambulatory Visit: Payer: Self-pay | Admitting: Cardiology

## 2024-08-11 NOTE — Care Plan (Signed)
 Problem: PAIN - ADULT Goal: Verbalizes/displays adequate comfort level or baseline comfort level Description: INTERVENTIONS: 1. Encourage pt to monitor pain and request assistance 2. Assess pain using appropriate pain scale 3. Administer analgesics based on type and severity of pain and evaluate response 4. Implement non-pharmacological measures as appropriate and evaluate response 5. Consider cultural and social influences on pain and pain management 6. Notify LIP if interventions unsuccessful or patient reports new pain Outcome: Adequate for Discharge   Problem: INFECTION - ADULT Goal: Absence of infection during hospitalization Description: INTERVENTIONS: 1. Assess and monitor for signs and symptoms of infection 2. Monitor lab/diagnostic results 3. Monitor all insertion sites i.e., indwelling lines, tubes and drains 4. Monitor endotracheal (as able) and nasal secretions for changes in amount and color 5. Institute appropriate cooling/warming therapies per order 6. Administer medications as ordered 7. Instruct and encourage patient and family to use good hand hygiene technique 8. Identify and instruct in appropriate isolation precautions for identified infection/condition Outcome: Adequate for Discharge Goal: Absence of fever/infection during anticipated neutropenic period Description: INTERVENTIONS 1. Monitor WBC 2. Administer growth factors as ordered 3. Implement neutropenic guidelines as ordered Outcome: Adequate for Discharge   Problem: Safety - Adult Goal: Free from fall injury Description: INTERVENTIONS: 1. Assess pt frequently for physical needs 2. Identify cognitive and physical deficits and behaviors that affect risk of falls. 3. Institute fall precautions as indicated by assessment. 4. Educate pt/family on patient safety including physical limitations 5. Instruct pt to call for assistance with activity based on assessment 6. Modify environment to reduce risk of  injury 7. Consider OT/PT consult to assist with strengthening/mobility Outcome: Adequate for Discharge Goal: Absence of infection during hospitalization Description: INTERVENTIONS: 1. Assess and monitor for signs and symptoms of infection 2. Monitor lab/diagnostic results 3. Monitor all insertion sites i.e., indwelling lines, tubes and drains 4. Monitor endotracheal (as able) and nasal secretions for changes in amount and color 5. Institute appropriate cooling/warming therapies per order 6. Administer medications as ordered 7. Instruct and encourage patient and family to use good hand hygiene technique 8. Identify and instruct in appropriate isolation precautions for identified infection/condition Outcome: Adequate for Discharge   Problem: DISCHARGE PLANNING Goal: Discharge to home or other facility with appropriate resources Description: INTERVENTIONS: 1. Identify barriers to discharge w/pt and caregiver 2. Arrange for needed discharge resources and transportation as appropriate 3. Identify discharge learning needs (meds, wound care, etc) 4. Arrange for interpreters to assist at discharge as needed 5. Refer to Case Management Department for coordinating discharge planning if the patient needs post-hospital services based on physician order or complex needs related to functional status, cognitive ability or social support system Outcome: Adequate for Discharge   Problem: Chronic Conditions and Co-Morbidities Goal: Patient's chronic conditions and co-morbidity symptoms are monitored and maintained or improved Description: INTERVENTIONS: 1. Monitor and assess patient's chronic conditions and comorbid symptoms for stability, deterioration, or improvement 2. Collaborate with multidisciplinary team to address chronic and comorbid conditions and prevent exacerbation or deterioration 3. Update acute care plan with appropriate goals if chronic or comorbid symptoms are exacerbated and prevent  overall improvement and discharge Outcome: Adequate for Discharge   Problem: Respiratory: Goal: Ability to maintain adequate ventilation will improve in 24 hours Description: Ability to maintain adequate ventilation will improve by discharge Outcome: Adequate for Discharge Goal: Ability to maintain arterial blood gas levels will improve by discharge Description: Ability to maintain arterial blood gas levels within normal range will improve Outcome: Adequate for Discharge Goal:  Ability to maintain normal pulse oximetry readings will improve by discharge Description: Ability to maintain normal pulse oximetry readings will improve by discharge Outcome: Adequate for Discharge   Problem: Activity: Goal: Ability to tolerate increased activity will improve Description: Ability to tolerate increased activity will improve Outcome: Adequate for Discharge   Problem: Health Behavior Goal: Knowledge of disease or condition will improve Description: Knowledge of disease or condition will improve Outcome: Adequate for Discharge Goal: Understanding of proper administration and use of medicines will improve Description: Understanding of proper administration and use of medicines will improve Outcome: Adequate for Discharge

## 2024-08-11 NOTE — Discharge Summary (Signed)
 [facility] Hospitalist Discharge Summary  Identifying Information:  Robert Spears [birth year] [id number]  Admit date: 07/14/2024  Discharge date: 08/11/2024  Discharge Service: [facility] Hospitalist  Discharge Attending Physician:Eshwar Braxton, MD  Discharge to: Skilled nursing facility  Discharge Diagnoses: Principal Problem:   Acute renal failure superimposed on stage 3 chronic kidney disease (CMD) Active Problems:   Coronary artery disease (CARDS)   Mixed hyperlipidemia   Benign hypertension (CARDS)   Presence of Watchman left atrial appendage closure device   Type 2 diabetes mellitus with stage 4 chronic kidney disease, without long-term current use of insulin  (HCC) (PCP)   Acute on chronic heart failure with preserved ejection fraction (HFpEF)    (CMD)   Alteration in self-care ability    Hospital Course:  73 year old gentleman with the above history who is presenting with weakness.  He and the majority of the family are poor historian, apparently about 3 weeks ago he was in his usual state of health then able to mow the grass without any difficulty.  He then complained of having worsening acute on chronic back pain and apparently since that time he has been unable to ambulate successfully secondary to weakness.  He now has to use a cane even within the house when before he did not.   He had shortness of breath and easy fatigability today after minimal exertion.  Otherwise no significant complaints Patient was admitted to hospital and found following problems,  # Acute on chronic cholecystitis s/p cholecystectomy (resolved)           Completed IV antibiotics with Zosyn--surgery signed off.  Repeat CT abdomen pelvis negative for acute pathology.   #Acute encephalopathy #Complex UTI #Leukocytosis During hospitaliztion pt developed confusion and given significant workup and evaluation above initial concern was to rule out metabolic encephalopthy due to infection. Blood cultures have  been negative though urinalysis conflicting (many bacteria, no WBC, LE, or nitrites) he has been started on ceftriaxone. - Blood culture (8/21) no growth to date at 2 days - No urine culture collected Plan - Completed 5 days of Rocephin.   - Continue delirium and fall precaution,  frequent reorientation..  - Appreciate psychiatry consult.  Mental status fluctuating.  Looks better today.  Continue Zyprexa  at bedtime.  MRI of the brain negative for acute pathology.     # AKI on CKD (Stage IV)            Last Creatinine of 2.64 on 05/20/24 with eGFR of 25. Latest creatinine of 4 and eGFR of 15.  Cr level was trending up.  Nephrology is following the patient closely, help appreciated.  Follow-up nephrology recommendations.  Completed IV albumin and follow-up BMP closely.  Renally dose medication.  Avoid nephrotoxic medications.  Due to increasing creatinine and patient was becoming uremic with some signs/symptoms of encephalopathy started on hemodialysis on 07/29/2024.  Clinically patient reports improvement. Patient is status post PermCath placement on 08/02/2024 by radiology.  Plans to continue hemodialysis as scheduled outpatient on current schedule.  Patient is getting hemodialysis today. Current schedule Monday Wednesday Friday     # Acute HFpEF Exacerbation: Stable  Worsening DOE, pulmonary congestion, bilateral effusions, elevated BNP Likely volume overload contributing to weakness Started on hemodialysis. Monitor fluid status and electrolytes Continue carvedilol  and hydralazine .  Blood pressure has been fluctuating significantly.  Monitor blood pressure closely and adjust antihypertensive regimen as tolerated. Will continue hemodialysis as a schedule outpatient on Monday, Wednesday and Friday schedule.   # Hyponatremia likely SIADH:  Improving.              # Failure to Thrive / Mechanical Fall Progressive weakness, recent fall without LOC No acute findings on imaging PT/OT evaluated  and recommended home with home PT  Nutritional support Monitor for further decline   # Iron Deficiency Anemia Chronic anemia, currently on PO iron Completed IV Venofer (8/1-8/5) S/p multiple transfusions.  No active bleeding noted.  CT abdomen pelvis negative for acute pathology.  Monitor CBC closely and transfuse to keep hemoglobin more than 7.  Hemoglobin stable for last 72 hours.   # Permanent Atrial Fibrillation s/p Watchman Device Stable rhythm, no acute arrhythmia Continue Coreg    # CAD s/p CABG, HTN, HLD Stable, no acute ischemic symptoms Continue aspirin , statin and beta-blockers. Monitor BP and lipid panel Patient had stress test which showed intermittent probability, cardiology no plan for pursuing further workup and they signed off.   # Hypothyroidism TSH normal Continue home Synthroid    # Diabetes mellitus. - Will resume home regimen.  Recent HbA1c 7.2.  Monitor sugar level closely.   # Incidental findings: Bilateral renal cysts. There is 1 small mildly hyperdense lesion in the kidney which may represent a complex cyst. Recommend follow-up ultrasound.   # Nonsustained V. tach. -Cardiology consult appreciated.  Continue carvedilol .  Though perfusion scan was considered cardiology felt there was no acute concerns for ischemia and deferred further follow up to the outpatient setting.   # Hypertension.  Blood pressure fluctuating.  Continue current regimen.  Monitor blood pressure closely and adjust antihypertensive accordingly.   Patient seems to be stable for the discharge but he has a high potential for readmission due to multiple medical problems.  Post Discharge Follow Up Issues:  I would like to request a physician at nursing home to follow the blood pressure/sugar level and adjust antihypertensive/diabetic regimen accordingly. Patient is to continue getting dialysis as scheduled. Patient will need a repeat renal ultrasound in 4 to 6 weeks for reassessment of  renal cyst.   Procedures: Cholecystectomy Dialysis catheter placement _____________________________________________________________________________ Discharge Day Services: BP (!) 173/81 (BP Location: Right arm, Patient Position: Lying)   Pulse 71   Temp 98.1 F (36.7 C) (Oral)   Resp 18   Ht 1.854 m (6' 1)   Wt 92.8 kg (204 lb 8 oz)   SpO2 98%   BMI 26.98 kg/m  Pt seen on the day of discharge and determined appropriate for discharge. General physical exam.  Patient is awake, alert and pleasantly confused.  Looks comfortable.  Condition at Discharge: fair  Length of Discharge: I spent 45 mins in the discharge of this patient. _____________________________________________________________________________ Discharge Medications: Patient Instructions:    Discharge Medications     New Medications      Sig Disp Refill Start End  acetaminophen  500 mg tablet Commonly known as: TYLENOL   Take 1 tablet (500 mg total) by mouth every 6 (six) hours as needed for mild pain (1-3), headaches or fever 100.4 F or GREATER.   0     OLANZapine  2.5 mg tablet Commonly known as: ZyPREXA   Take 1 tablet (2.5 mg total) by mouth at bedtime.   0     omeprazole 40 mg DR capsule Commonly known as: PriLOSEC  Take 1 capsule (40 mg total) by mouth in the morning.   0  August 12, 2024    polyethylene glycol 17 gram packet Commonly known as: GLYCOLAX   Take 17 g by mouth daily for 14 days.  0  August 12, 2024    sennosides-docusate sodium  8.6-50 mg per tablet Commonly known as: PERICOLACE  Take 2 tablets by mouth daily.   0  August 12, 2024    sodium bicarbonate  650 mg tablet  Take 1 tablet (650 mg total) by mouth 3 (three) times a day.   0         Modified Medications      Sig Disp Refill Start End  amLODIPine  2.5 mg tablet Commonly known as: NORVASC  What changed:  medication strength how much to take  Take 1 tablet (2.5 mg total) by mouth daily.   0  August 12, 2024     atorvastatin  20 mg tablet Commonly known as: LIPITOR What changed:  medication strength how much to take when to take this additional instructions  Take 1 tablet (20 mg total) by mouth every evening.   0     hydrALAZINE  25 mg tablet Commonly known as: APRESOLINE  What changed:  medication strength how much to take  Take 1 tablet (25 mg total) by mouth in the morning and 1 tablet (25 mg total) at noon and 1 tablet (25 mg total) in the evening.   0         Medications To Continue      Sig Disp Refill Start End  aspirin  325 mg EC tablet Commonly known as: ECOTRIN  Take 325 mg by mouth Once Daily for 90 days.  30 tablet  2     carvediloL  25 mg tablet Commonly known as: COREG   Take 25 mg by mouth in the morning and 25 mg in the evening. Take with meals.   0     EPINEPHrine  0.3 mg/0.3 mL injection syringe Commonly known as: EPIPEN   Inject 0.3 mg into the thigh.   0     ergocalciferol 1,250 mcg (50,000 unit) capsule Commonly known as: VITAMIN D2  Take 1 capsule by mouth once a week  13 capsule  4     ezetimibe  10 mg tablet Commonly known as: ZETIA   Take 10 mg by mouth every morning.   0     ferrous sulfate  325 mg (65 mg iron) EC tablet  Take 325 mg by mouth 2 (two) times a day.   0     levothyroxine  50 mcg tablet Commonly known as: SYNTHROID   Take 1 tablet (50 mcg total) by mouth daily. thyroid   90 tablet  3     multivitamin Tab tablet Commonly known as: THERAGRAN  Take 1 tablet by mouth daily.   0     nitroglycerin  0.4 mg SL tablet Commonly known as: NITROSTAT   Place 0.4 mg under the tongue every 5 (five) minutes as needed.   0     OneTouch Verio test strips test strip Generic drug: glucose blood  USE 1 STRIP TO CHECK GLUCOSE TWICE DAILY  200 each  3     semaglutide 7 mg Tab tablet Commonly known as: Rybelsus  Take 1 tablet (7 mg total) by mouth daily with breakfast. Diabetes *PAP rx*   0         Stopped Medications    dapagliflozin  propanediol 5 mg Tab tablet Commonly known as: Farxiga   furosemide  40 mg tablet Commonly known as: LASIX    metFORMIN 1,000 mg tablet Commonly known as: GLUCOPHAGE        _____________________________________________________________________________ Pending Test Results (if blank, then none):    Most Recent Labs:  Lab Results  Component Value Date/Time  WBC 8.29 08/11/2024 0302   RBC 2.92 (L) 08/11/2024 0302   HGB 8.8 (L) 08/11/2024 0302   HCT 25.1 (L) 08/11/2024 0302   PLT 205 08/11/2024 0302    Lab Results  Component Value Date   WBC 8.29 08/11/2024   HGB 8.8 (L) 08/11/2024   HCT 25.1 (L) 08/11/2024   PLT 205 08/11/2024    Lab Results  Component Value Date   CO2 28 08/11/2024   BUN 31 (H) 08/11/2024   GLUCOSE 149 (H) 08/11/2024   CREATININE 2.63 (H) 08/11/2024   CALCIUM  8.0 (L) 08/11/2024   ALBUMIN 2.9 (L) 08/04/2024   AST 17 08/04/2024   ALT 10 08/04/2024    Lab Results  Component Value Date   NA 135 (L) 08/11/2024   K 3.5 08/11/2024   CL 100 08/11/2024   CO2 28 08/11/2024   BUN 31 (H) 08/11/2024   CREATININE 2.63 (H) 08/11/2024   CALCIUM  8.0 (L) 08/11/2024   MG 2.0 08/11/2024   PHOS 3.9 08/08/2024    Lab Results  Component Value Date   BILITOT 0.6 08/04/2024   BILIDIR 0.1 07/27/2024   PROT 5.8 (L) 08/04/2024   ALBUMIN 2.9 (L) 08/04/2024   ALT 10 08/04/2024   AST 17 08/04/2024   GGT 38 07/15/2024    Lab Results  Component Value Date   INR 1.04 07/15/2022   APTT 26.3 09/28/2020   Hospital Radiology:  MRI Brain WO Contrast Result Date: 08/09/2024 CLINICAL DATA:  Initial evaluation for acute neuro deficit, stroke suspected. EXAM: MRI HEAD WITHOUT CONTRAST TECHNIQUE: Multiplanar, multiecho pulse sequences of the brain and surrounding structures were obtained without intravenous contrast. COMPARISON:  Comparison made with prior CT from 08/04/2024 as well as prior MRI from 07/15/2022 FINDINGS: Brain: Examination degraded by motion  artifact. Diffuse prominence of the CSF containing spaces compatible with generalized cerebral atrophy. Encephalomalacia and gliosis involving the posterior left cerebral hemisphere, with involvement of the left parietal, occipital, and temporal lobes, consistent with a chronic posterior left MCA territory infarct. Mild chronic hemosiderin staining noted about the left occipital/periatrial region. Additional small remote lacunar infarct noted at the left corona radiata/basal ganglia. Tiny remote right cerebellar infarct noted. Patchy high changes of chronic microvascular ischemic disease noted involving the underlying supratentorial cerebral white matter and pons. No abnormal foci of restricted diffusion to suggest acute or subacute ischemia. No other areas of chronic cortical infarction. No acute intracranial hemorrhage. Few scattered chronic micro hemorrhages noted, likely hypertensive in nature. No mass lesion, midline shift or mass effect. Ventricular prominence with ex vacuo dilatation of the left lateral ventricle without hydrocephalus. No extra-axial fluid collection. Pituitary gland within normal limits. Vascular: Major intracranial vascular flow voids are maintained at the skull base. Skull and upper cervical spine: Craniocervical junction within normal limits. Bone marrow signal intensity normal. No scalp soft tissue abnormality. Sinuses/Orbits: Globes and orbital soft tissues within normal limits. Small left maxillary sinus retention cyst. Mild scattered mucosal thickening noted about the ethmoidal air cells. No mastoid effusion. Other: None.   1. No acute intracranial abnormality. 2. Chronic posterior left MCA territory infarct, with additional small remote lacunar infarcts involving the left corona radiata/basal ganglia and right cerebellum. 3. Underlying age-related cerebral atrophy with chronic small vessel ischemic disease. Electronically Signed   By: Morene Hoard M.D.   On: 08/09/2024  18:18   CT Abdomen Pelvis WO Contrast Result Date: 08/07/2024 CLINICAL DATA:  Recent surgery with drop in hemoglobin. Concern for intra-abdominal bleed. EXAM: CT ABDOMEN AND  PELVIS WITHOUT CONTRAST TECHNIQUE: Multidetector CT imaging of the abdomen and pelvis was performed following the standard protocol without IV contrast. RADIATION DOSE REDUCTION: This exam was performed according to the departmental dose-optimization program which includes automated exposure control, adjustment of the mA and/or kV according to patient size and/or use of iterative reconstruction technique. COMPARISON:  CT abdomen pelvis dated 07/23/2024. FINDINGS: Evaluation of this exam is limited in the absence of intravenous contrast. Lower chest: Partially visualized bilateral pleural effusions, right greater left with consolidative changes of the visualized right lower lobe which may represent atelectasis or pneumonia. There is coronary vascular calcification and left atrial appendage closure device. There is hypoattenuation of the cardiac blood pool suggestive of anemia. Clinical correlation is recommended. No intra-abdominal free air.  Small ascites, new since the prior CT. Hepatobiliary: Subcentimeter hypodense lesion in the right lobe of the liver is too small to characterize. No biliary dilatation. Cholecystectomy. Pancreas: Unremarkable. No pancreatic ductal dilatation or surrounding inflammatory changes. Spleen: Small which shaped area of hypodensity in the inferior pole of the spleen appears similar to prior CT likely related to infarct. Adrenals/Urinary Tract: The adrenal glands unremarkable. Faint punctate radiopaque focus in the upper pole of the left kidney may represent vascular calcification or tiny nonobstructing stone. There is no hydronephrosis or obstructing stone on either side. Small bilateral renal cysts, poorly characterized on this noncontrast CT. The visualized ureters and urinary bladder appear unremarkable.  Stomach/Bowel: There is sigmoid diverticulosis. There is no bowel obstruction. The appendix is poorly visualized and suboptimally evaluated. Vascular/Lymphatic: Advanced aortoiliac atherosclerotic disease. The IVC is unremarkable. No portal venous gas. There is no adenopathy. Reproductive: The prostate is no struggle. Other: Diffuse subcutaneous edema and anasarca. Musculoskeletal: Osteopenia.  No acute osseous pathology.   1. No acute intra-abdominal or pelvic pathology. No large hematoma. 2. Small ascites, new since the prior CT. 3. Partially visualized bilateral pleural effusions, right greater left with consolidative changes of the visualized right lower lobe which may represent atelectasis or pneumonia. 4. Sigmoid diverticulosis. No bowel obstruction. 5.  Aortic Atherosclerosis (ICD10-I70.0). Electronically Signed   By: Vanetta Chou M.D.   On: 08/07/2024 18:07   XR Chest 1 View Result Date: 08/04/2024 CLINICAL DATA:  Hypotension. EXAM: CHEST  1 VIEW COMPARISON:  07/15/2024. FINDINGS: Stable cardiomegaly. Right IJ hemodialysis catheter tip in right atrium. Prior median sternotomy and CABG. Bilateral patchy and interstitial opacities, favored to reflect pulmonary vascular congestion with probable interstitial edema. No sizable pleural effusion or pneumothorax. No acute osseous abnormality.   Cardiomegaly with bilateral patchy and interstitial opacities, favored to reflect pulmonary vascular congestion with probable interstitial edema. Electronically Signed   By: Harrietta Sherry M.D.   On: 08/04/2024 12:35   CT Head WO Contrast Result Date: 08/04/2024 CLINICAL DATA:  Provided history: Mental status change, unknown cause. EXAM: CT HEAD WITHOUT CONTRAST TECHNIQUE: Contiguous axial images were obtained from the base of the skull through the vertex without intravenous contrast. RADIATION DOSE REDUCTION: This exam was performed according to the departmental dose-optimization program which includes  automated exposure control, adjustment of the mA and/or kV according to patient size and/or use of iterative reconstruction technique. COMPARISON:  Head CT 07/15/2024.  Brain MRI 07/15/2022. FINDINGS: Brain: Generalized cerebral and cerebellar atrophy. Redemonstrated large chronic cortical/subcortical left MCA territory infarct (affecting portions of the temporal, parietal and occipital lobes). Associated ex vacuo dilatation of the left lateral ventricle. Known chronic lacunar infarct within the left corona radiata. Background moderate patchy and ill-defined hypoattenuation within the  cerebral white matter, nonspecific but compatible with chronic small vessel ischemic disease. There is no acute intracranial hemorrhage. No acute demarcated cortical infarct. No extra-axial fluid collection. No evidence of an intracranial mass. No midline shift. Vascular: No hyperdense vessel.  Atherosclerotic calcifications. Skull: No calvarial fracture or aggressive osseous lesion. Sinuses/Orbits: No mass or acute finding within the imaged orbits. Trace mucosal thickening within the left maxillary sinus at the imaged   1.  No evidence of an acute intracranial abnormality. 2. Unchanged large chronic left middle cerebral artery territory infarct. 3. Known chronic lacunar infarct within the left corona radiata. 4. Background parenchymal atrophy and cerebral white matter chronic small vessel ischemic disease. Electronically Signed   By: Rockey Childs D.O.   On: 08/04/2024 12:24   NM Cardiac SPECT Multi (Rest and Stress) Result Date: 08/03/2024 CLINICAL DATA:  Ventricular tachycardia (VT), nonsustained. Comorbid with history of CAD EXAM: MYOCARDIAL IMAGING WITH SPECT (REST AND PHARMACOLOGIC-STRESS) GATED LEFT VENTRICULAR WALL MOTION STUDY LEFT VENTRICULAR EJECTION FRACTION TECHNIQUE: Standard myocardial SPECT imaging was performed after resting intravenous injection of 10.8 mCi Tc-48m sestamibi. Subsequently, intravenous infusion of  Lexiscan  was performed under the supervision of the Cardiology staff. At peak effect of the drug, 33.0 mCi Tc-22m sestamibi was injected intravenously and standard myocardial SPECT imaging was performed. Quantitative gated imaging was also performed to evaluate left ventricular wall motion, and estimate left ventricular ejection fraction. COMPARISON:  Chest XR, 07/15/2024. FINDINGS: Perfusion: Fixed defect at the lateral LEFT ventricular wall. No decreased activity on stress imaging to suggest reversible ischemia. Wall Motion: Mild, relative lateral LEFT ventricular wall hypokinesis. No LEFT ventricular dilation. Left Ventricular Ejection Fraction: 40 % End diastolic volume 102 ml End systolic volume 61 ml SPECT CT attenuation correction imaging review; Anterior pleural ended occluded device. Coronary bypass changes. Severe burden of native coronary atherosclerosis. Aortic atherosclerosis. BILATERAL pleural effusions moderate volume RIGHT and small volume LEFT. No pneumothorax. Small volume ascites at the perihepatic and perisplenic spaces. Normal noncontrast appearance of the liver without focal abnormality.   1. Fixed defect at the lateral LEFT ventricular wall, favored consistent with prior infarct. No reversible ischemia. 2. Mild lateral LEFT ventricular wall hypokinesis. Otherwise normal left ventricular wall motion. 3. Left ventricular ejection fraction 40% 4. Non invasive risk stratification*: Intermediate *2012 Appropriate Use Criteria for Coronary Revascularization Focused Update: J Am Coll Cardiol. 2012;59(9):857-881. http://content.[email or web address].aspx?articleid=[email or web address] Electronically Signed   By: Thom Doom M.D.   On: 08/03/2024 14:30   Stress test Result Date: 08/03/2024 Protocol Name                      LEXISCAN                       % of Max Predicted HR              85        %                   Time In Exercise Phase             00 03 00                      Max. Systolic BP                    135       mmHg  Max Heart Rate                     105       BPM                 Max Predicted Heart Rate           146       BPM                 1.  The resting EKG shows atrial fibrillation, low QRS voltage, a RBBB, and nonspecific ST abnormalities. 2. Normal EKG response to Lexiscan  stress. 3. The nuclear image results will appear in a separate report from Radiology. Alverna Sieving, DO  [facility] Confirmed by Sieving Alverna  [phone]  on 08-03-2024 11 08 58 AM  IR Permacath Placement Result Date: 08/02/2024 CLINICAL DATA:  Status post placement of temporary non tunneled hemodialysis catheter on 07/29/2024. Tunneled dialysis catheter now needed for longer term hemodialysis. EXAM: TUNNELED CENTRAL VENOUS HEMODIALYSIS CATHETER PLACEMENT WITH ULTRASOUND AND FLUOROSCOPIC GUIDANCE ANESTHESIA/SEDATION: Moderate (conscious) sedation was employed during this procedure. A total of Versed 0.5 mg and Fentanyl  25 mcg was administered intravenously. Moderate Sedation Time: 16 minutes. The patient's level of consciousness and vital signs were monitored continuously by radiology nursing throughout the procedure under my direct supervision. MEDICATIONS: 2 g IV Ancef . FLUOROSCOPY: Radiation Exposure Index: 13.8 mGy Kerma PROCEDURE: The procedure, risks, benefits, and alternatives were explained to the patient. Questions regarding the procedure were encouraged and answered. The patient understands and consents to the procedure. A timeout was performed prior to initiating the procedure. The right neck and chest were prepped with chlorhexidine  in a sterile fashion, and a sterile drape was applied covering the operative field. Maximum barrier sterile technique with sterile gowns and gloves were used for the procedure. Local anesthesia was provided with 1% lidocaine . Ultrasound was performed to confirm patency of the right internal jugular vein. An ultrasound image was saved and recorded. After creating a small venotomy  incision, a 21 gauge needle was advanced into the right internal jugular vein under direct, real-time ultrasound guidance. After securing guidewire access, an 8 Fr dilator was placed. A J-wire was kinked to measure appropriate catheter length. A Palindrome tunneled hemodialysis catheter measuring 23 cm from tip to cuff was chosen for placement. This was tunneled in a retrograde fashion from the chest wall to the venotomy incision. At the venotomy, serial dilatation was performed and a 15 Fr peel-away sheath was placed over a guidewire. The catheter was then placed through the sheath and the sheath removed. Final catheter positioning was confirmed and documented with a fluoroscopic spot image. The catheter was aspirated, flushed with saline, and injected with appropriate volume heparin  dwells. The venotomy incision was closed with subcuticular 4-0 Vicryl. Dermabond was applied to the incision. The catheter exit site was secured with 0-Prolene retention sutures. COMPLICATIONS: None.  No pneumothorax. FINDINGS: After catheter placement, the tip lies in the right atrium. The catheter aspirates normally and is ready for immediate use.   Placement of tunneled hemodialysis catheter via the right internal jugular vein. The catheter tip lies in the right atrium. The catheter is ready for immediate use. Electronically Signed   By: Marcey Moan M.D.   On: 08/02/2024 16:57   ECG 12 lead Result Date: 07/30/2024 Ventricular Rate                   104  BPM                 QRS Duration                       108       ms                  Q-T Interval                       356       ms                  QTC Calculation Bazett             468       ms                  Calculated R Axis                  0         degrees             Calculated T Axis                  -16       degrees             Atrial fibrillation with rapid ventricular response with premature ventricular complexes Low voltage QRS, consider pulmonary  disease or obesity Right bundle branch block When compared with ECG of 26-Jul-2024 19 10, QRS duration has decreased ST now depressed in anterior leads Nonspecific T wave abnormality no longer evident in lateral leads Confirmed by Raylene Ned  [id number]  on 07-30-2024 9 23 34 AM  IR Insert Non Tunnel Cath >58Yr Result Date: 07/29/2024 INDICATION: 73 year old with acute renal failure.  Patient needs dialysis. EXAM: FLUOROSCOPIC AND ULTRASOUND GUIDED PLACEMENT OF A NON-TUNNELED DIALYSIS CATHETER Physician: Juliene SAUNDERS. Philip, MD MEDICATIONS: 1% lidocaine  for local anesthetic ANESTHESIA/SEDATION: None FLUOROSCOPY TIME:  Radiation Exposure Index (as provided by the fluoroscopic device): 7.2 mGy Kerma COMPLICATIONS: None immediate. PROCEDURE: Informed consent was obtained for catheter placement. The patient was placed supine on the interventional table. Ultrasound confirmed a patent right internal jugular vein. Ultrasound images were obtained for documentation. The right neck was prepped and draped in a sterile fashion. Maximal barrier sterile technique was utilized including caps, mask, sterile gowns, sterile gloves, sterile drape, hand hygiene and skin antiseptic. The right neck was anesthetized with 1% lidocaine . A small incision was made with #11 blade scalpel. A 21 gauge needle directed into the right internal jugular vein with ultrasound guidance. A micropuncture dilator set was placed. A 20 cm Trialysis catheter was selected. The catheter was advanced over a wire and positioned at the superior cavoatrial junction. Fluoroscopic images were obtained for documentation. Both dialysis lumens were found to aspirate and flush well. The proper amount of heparin  was flushed in both lumens. The central venous lumen was flushed with normal saline. Catheter was sutured to skin. FINDINGS: Catheter tip at the superior cavoatrial junction.   Successful placement of a right jugular non-tunneled dialysis catheter using ultrasound and  fluoroscopic guidance. Electronically Signed   By: Juliene Philip M.D.   On: 07/29/2024 18:35   US  Renal Bilateral Complete Result Date: 07/28/2024 CLINICAL DATA:  Acute kidney injury on chronic renal disease. EXAM: RENAL / URINARY TRACT ULTRASOUND COMPLETE COMPARISON:  None Available. FINDINGS: Right Kidney: Renal measurements: 8.7 cm x  4.5 cm x 5.7 cm = volume: 115.5 mL. There is diffusely increased echogenicity of the renal parenchyma. 1.8 cm x 1.2 cm x 1.2 cm and 1.8 cm x 1.4 cm x 1.3 cm renal cysts are seen within the upper pole of the right kidney. No hydronephrosis is visualized. Left Kidney: Renal measurements: 9.6 cm x 4.5 cm x 5.0 cm = volume: 114.6 mL. There is diffusely increased echogenicity of the renal parenchyma. 1.5 cm x 1.4 cm x 1.4 cm and 1.4 cm x 1.2 cm x 1.7 cm renal cysts are seen within the mid left kidney. No hydronephrosis is visualized. Bladder: Echogenic debris is noted within the urinary bladder lumen. Other: None.   1. Bilateral echogenic kidneys which may represent sequelae associated with medical renal disease. 2. Bilateral renal cysts. 3. Echogenic debris within the urinary bladder which may represent sequelae related to an infectious or inflammatory process. Electronically Signed   By: Suzen Dials M.D.   On: 07/28/2024 20:43   ECG 12 lead Result Date: 07/27/2024 Ventricular Rate                   76        BPM                 QRS Duration                       138       ms                  Q-T Interval                       430       ms                  QTC Calculation Bazett             483       ms                  Calculated R Axis                  5         degrees             Calculated T Axis                  -65       degrees             Atrial fibrillation Right bundle branch block When compared with ECG of 24-Jul-2024 08 57, No significant change Confirmed by Rojelio Dunnings  [phone]  on 07-27-2024 7 31 24  AM  FL Cholangiogram Intraoperative Result Date:  07/24/2024 CLINICAL DATA:  Cholecystitis EXAM: INTRAOPERATIVE CHOLANGIOGRAM TECHNIQUE: Cholangiographic images from the C-arm fluoroscopic device were submitted for interpretation post-operatively. Please see the procedural report for the amount of contrast and the fluoroscopy time utilized. FLUOROSCOPY: Radiation Exposure Index (as provided by the fluoroscopic device): 3.75 mGy Kerma 16 seconds of fluoroscopy COMPARISON:  None Available. FINDINGS: Injection was performed intraoperatively although no significant visualization of the biliary tree is noted. Correlate with the operative findings.   Injection shows no visualization of the biliary tree. Electronically Signed   By: Oneil Devonshire M.D.   On: 07/24/2024 17:07   ECG 12 lead Result Date: 07/24/2024 Ventricular Rate  76        BPM                 QRS Duration                       140       ms                  Q-T Interval                       436       ms                  QTC Calculation Bazett             490       ms                  Calculated R Axis                  252       degrees             Calculated T Axis                  114       degrees             Atrial fibrillation with premature ventricular complexes Indeterminate axis Right bundle branch block When compared with ECG of 15-Jul-2024 00 42, Minor change in QRS axis T wave inversion no longer evident in anterior leads Confirmed by Debora Mealing  [id number]  on 07-24-2024 12 06 02 PM  US  Abdomen Limited Result Date: 07/24/2024 CLINICAL DATA:  Right upper quadrant pain. EXAM: ULTRASOUND ABDOMEN LIMITED RIGHT UPPER QUADRANT COMPARISON:  CT abdomen pelvis 07/23/2024. FINDINGS: Gallbladder: Shadowing echogenic stones measure up to 9 mm. Slight wall thickening, 4 mm. Negative sonographic Murphy sign. No pericholecystic fluid. Common bile duct: Diameter: 2 mm, within normal limits. No intrahepatic biliary ductal dilatation. Liver: No focal lesion identified. Within normal limits in  parenchymal echogenicity. Portal vein is patent on color Doppler imaging with normal direction of blood flow towards the liver. Other: Right pleural effusion.   1. Cholelithiasis with slight wall thickening, negative sonographic Murphy sign. Findings can be seen with chronic cholecystitis. 2. Right pleural effusion. Electronically Signed   By: Newell Eke M.D.   On: 07/24/2024 10:25   CT Abdomen Pelvis WO Contrast Result Date: 07/23/2024 CLINICAL DATA:  Right lower quadrant pain and flank pain. EXAM: CT ABDOMEN AND PELVIS WITHOUT CONTRAST TECHNIQUE: Multidetector CT imaging of the abdomen and pelvis was performed following the standard protocol without IV contrast. RADIATION DOSE REDUCTION: This exam was performed according to the departmental dose-optimization program which includes automated exposure control, adjustment of the mA and/or kV according to patient size and/or use of iterative reconstruction technique. COMPARISON:  Small left and moderate right pleural effusions are present. There is atelectasis in the bilateral lower lobes. FINDINGS: Lower chest: Gallstones are present. There is no biliary ductal dilatation. There is questionable mild inflammatory stranding surrounding the neck of the gallbladder. Rounded 9 mm fat containing lesion is seen in the left lobe of the liver. Liver is mildly enlarged. Hepatobiliary: Scattered pancreatic calcifications are present. No acute inflammation. Pancreas: Spleen is mildly enlarged. Spleen: There is no hydronephrosis. There is mild bilateral nonspecific perinephric fat stranding. There are punctate calculi in the left kidney. There  are rounded hypodensities in both kidneys which are too small to characterize, most likely cysts. Mildly hyperdense small lesion is seen in the posterior right kidney measuring 10 mm image 2/52. The adrenal glands and bladder are within normal limits. Adrenals/Urinary Tract: Adrenal glands are unremarkable. Kidneys are normal,  without renal calculi, focal lesion, or hydronephrosis. Bladder is unremarkable. Stomach/Bowel: Stomach is within normal limits. Appendix appears normal. No evidence of bowel wall thickening, distention, or inflammatory changes.There are scattered colonic diverticula. Vascular/Lymphatic: Prostate gland is enlarged. Reproductive: There is diffuse body wall edema and presacral edema. There is no ascites. Other: Sternotomy wires are present. Degenerative changes affect the spine. Musculoskeletal: No fracture is seen.   1. Cholelithiasis with questionable inflammation surrounding the gallbladder. Please correlate clinically for cholecystitis. Consider further evaluation with ultrasound. 2. Nonobstructing left renal calculi. 3. Diffuse body wall edema and presacral edema. 4. Bilateral renal cysts. There is 1 small mildly hyperdense lesion in the kidney which may represent a complex cyst. Recommend follow-up ultrasound. 5. Small fat containing lesion in the liver, likely benign. 6. Moderate right and small left pleural effusions. Aortic Atherosclerosis (ICD10-I70.0). Electronically Signed   By: Greig Pique M.D.   On: 07/23/2024 17:35   US  Renal Bilateral Complete Result Date: 07/16/2024 CLINICAL DATA:  Acute renal insufficiency EXAM: RENAL / URINARY TRACT ULTRASOUND COMPLETE COMPARISON:  11/26/2020 FINDINGS: Right Kidney: Renal measurements: 10.7 x 4.5 x 5.8 cm = volume: 145.8 mL. Stable lobular contour of the kidney. Multiple simple cysts within the right kidney, largest measuring 2 cm, which do not require specific imaging follow-up. Normal renal echotexture. No hydronephrosis. Left Kidney: Renal measurements: 11.4 x 5.3 x 5.0 cm = volume: 160.6 mL. Stable lobular contour of the left kidney. Multiple benign simple cysts are identified, largest measuring 2.0 cm, which do not require specific imaging follow-up. Normal renal echotexture. No hydronephrosis. Bladder: Appears normal for degree of bladder distention.  Other: None.   1. Benign simple bilateral renal cortical cysts, which do not require specific follow-up. Otherwise unremarkable renal ultrasound. Electronically Signed   By: Ozell Daring M.D.   On: 07/16/2024 14:24   Transthoracic echo (TTE) complete Result Date: 07/15/2024                                                   [facility]                                                 [facility]                                                 [place]                                                   [facility]                                                   Cardiac                                                 Ultrasound-                                                 [place],                                                [facility]                                                    Bldg                                                  [address]                                                 [place]                                                  [place] [address]                                  Transthoracic Echocardiogram Report Name  MARSALIS, BEAULIEU [place], [place].                Study Date  07-15-2024                  Height  73 in MRN  [id number]                                Patient Location  [facility]            Weight  190 lb DOB  06-24-1951  Gender  Male                            BSA  2.1 m2 Age  73 yrs                                  Ethnicity  1                            BP  167-80 mmHg Reason For Study  LV function, status change, eval                                   HR  77 History  LV function Ordering Physician  MAGHEN, PHILIP           Performed By  CORRINE RIGGS Referring Physician  MAGHEN, PHILIP - - PROCEDURE A two-dimensional transthoracic echocardiogram with color flow and Doppler was performed. Image Quality  Technically adequate. - SUMMARY The  left ventricular size is normal. LV ejection fraction = 55-60%. Left ventricular filling pattern is indeterminate.LAP NL. The right ventricle is mildly dilated. The left atrium is moderately dilated. No pulmonary hypertension. IVC size was moderately dilated. No significant change from prior study. IVC is dilated on todays s study but was not seen-reported on prior study. - FINDINGS LEFT VENTRICLE The left ventricular size is normal. There is borderline concentric left ventricular hypertrophy. LV ejection fraction = 55-60%. Left ventricular filling pattern is indeterminate. - RIGHT VENTRICLE The right ventricle is mildly dilated. The right ventricular systolic function is mildly reduced. LEFT ATRIUM The left atrium is moderately dilated. RIGHT ATRIUM Right atrial size is normal. - AORTIC VALVE The aortic valve is trileaflet. There is trivial aortic valve thickening. There is no aortic stenosis. There is no aortic regurgitation. - MITRAL VALVE The mitral valve is normal in structure and function. There is mild mitral regurgitation. - TRICUSPID VALVE The tricuspid valve is normal in structure and function. There is mild tricuspid regurgitation. No pulmonary hypertension. - PULMONIC VALVE The pulmonic valve is normal in structure and function. There is no pulmonic valvular regurgitation. - ARTERIES The aortic sinus is normal size. - VENOUS IVC size was moderately dilated. Right atrial pressure is estimated to be 15 mm Hg. - EFFUSION There is no pericardial effusion. - - MMode-2D Measurements & Calculations IVSd  1.1 cm               LA dim  5.8 cm             ESV MOD-sp4        LVOT diam  2.3 cm LVIDd  5.2 cm              EDV MOD-sp4   143.0 ml     53.1 ml LVPWd  1.1 cm LVIDs  3.9 cm           _________________________________________________________________________________ SV MOD-sp4   89.9 ml       EF A4C  62.9 %             LA ESV  BP         LA ESV Index  A2C  SI MOD-sp4   42.7 ml-m2  91.4 ml            39.2 ml-m2           _________________________________________________________________________________ LA ESV Index  A4C          LA ESV Index  BP           SV A4C  89.9 ml 43.3 ml-m2                 43.3 ml-m2 Doppler Measurements & Calculations MV E max vel  111.0 cm-sec  MV dec time  0.19 sec    SV LVOT   68.3 ml           LV V1 VTI  16.2 cm MV A max vel  52.9 cm-sec                            Ao V2 max  110.6 cm-sec MV E-A  2.1                                          Ao max PG  4.9 mmHg           _________________________________________________________________________________ PA max PG  3.8 mmHg         TR max vel  286.6 cm-sec                             TR max PG  32.9 mmHg     SV index LVOT   32.4 ml-m2 ______________________________________________________________________________ Reading Physician                   M.D. NILA LITTIE COHO, M.D., [phone] 07-15-2024 12 42 PM  ECG 12 lead Result Date: 07/15/2024 Ventricular Rate                   75        BPM                 Atrial Rate                        75        BPM                 P-R Interval                       80        ms                  QRS Duration                       128       ms                  Q-T Interval                       436       ms                  QTC Calculation Bazett             486       ms  Calculated R Axis                  -7        degrees             Calculated T Axis                  -23       degrees             Atrial Fibrillation With Controlled Ventricular Response Indeterminate axis Right bundle branch block Anterior infarct , age undetermined When compared with ECG of 15-Jul-2022 09 40, T wave inversion now evident in anterior leads Confirmed by Debora Mealing  [id number]  on 07-15-2024 8 31 19  AM  XR Chest 1 View Result Date: 07/15/2024 CLINICAL DATA:  Generalized weakness EXAM: PORTABLE CHEST 1 VIEW COMPARISON:  02/28/2021 FINDINGS: Cardiac shadow remains enlarged. Postsurgical  changes are again seen. Central vascular congestion is noted consistent with mild CHF. No significant edema is seen. No sizable effusion is noted. No focal infiltrate is seen.   Mild CHF. Electronically Signed   By: Oneil Devonshire M.D.   On: 07/15/2024 00:56   CT Head WO Contrast Result Date: 07/15/2024 CLINICAL DATA:  Recent fall with headaches and neck pain EXAM: CT HEAD WITHOUT CONTRAST CT CERVICAL SPINE WITHOUT CONTRAST TECHNIQUE: Multidetector CT imaging of the head and cervical spine was performed following the standard protocol without intravenous contrast. Multiplanar CT image reconstructions of the cervical spine were also generated. RADIATION DOSE REDUCTION: This exam was performed according to the departmental dose-optimization program which includes automated exposure control, adjustment of the mA and/or kV according to patient size and/or use of iterative reconstruction technique. COMPARISON:  07/15/2022 FINDINGS: CT HEAD FINDINGS Brain: No evidence of acute infarction, hemorrhage, hydrocephalus, extra-axial collection or mass lesion/mass effect. Chronic atrophic changes are noted. 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 Chronic white matter ischemic changes are noted as well. Vascular: No hyperdense vessel or unexpected calcification. Skull: Normal. Negative for fracture or focal lesion. Sinuses/Orbits: No acute finding. Other: None. CT CERVICAL SPINE FINDINGS Alignment: Within normal limits. Skull base and vertebrae: 7 cervical segments are well visualized. Vertebral body height is well maintained. Multilevel osteophytic change and facet hypertrophic changes are noted. No acute fracture or acute facet abnormality is noted. The odontoid is within normal limits. Soft tissues and spinal canal: Surrounding soft tissue structures are within normal limits. Upper chest: Upper chest shows bilateral pleural effusions right greater than left. Other: None   CT of the head:  Chronic atrophic and ischemic changes are noted. CT of the cervical spine: Multilevel degenerative change without acute bony abnormality. Bilateral pleural effusions right greater than left. Electronically Signed   By: Oneil Devonshire M.D.   On: 07/15/2024 00:40   CT Spine Cervical WO Contrast Result Date: 07/15/2024 CLINICAL DATA:  Recent fall with headaches and neck pain EXAM: CT HEAD WITHOUT CONTRAST CT CERVICAL SPINE WITHOUT CONTRAST TECHNIQUE: Multidetector CT imaging of the head and cervical spine was performed following the standard protocol without intravenous contrast. Multiplanar CT image reconstructions of the cervical spine were also generated. RADIATION DOSE REDUCTION: This exam was performed according to the departmental dose-optimization program which includes automated exposure control, adjustment of the mA and/or kV according to patient size and/or use of iterative reconstruction technique. COMPARISON:  07/15/2022 FINDINGS: CT HEAD FINDINGS Brain: No evidence of acute infarction, hemorrhage, hydrocephalus, extra-axial collection or mass lesion/mass effect. Chronic atrophic  changes are noted. 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 Chronic white matter ischemic changes are noted as well. Vascular: No hyperdense vessel or unexpected calcification. Skull: Normal. Negative for fracture or focal lesion. Sinuses/Orbits: No acute finding. Other: None. CT CERVICAL SPINE FINDINGS Alignment: Within normal limits. Skull base and vertebrae: 7 cervical segments are well visualized. Vertebral body height is well maintained. Multilevel osteophytic change and facet hypertrophic changes are noted. No acute fracture or acute facet abnormality is noted. The odontoid is within normal limits. Soft tissues and spinal canal: Surrounding soft tissue structures are within normal limits. Upper chest: Upper chest shows bilateral pleural effusions right greater than left. Other: None   CT of the  head: Chronic atrophic and ischemic changes are noted. CT of the cervical spine: Multilevel degenerative change without acute bony abnormality. Bilateral pleural effusions right greater than left. Electronically Signed   By: Oneil Devonshire M.D.   On: 07/15/2024 00:40    _____________________________________________________________________________ Discharge Instructions:   Discharge Orders     Activity Instructions:     Details:    Activity Limits: As you are able   Discharge Instructions - Bathing-Showering     Details:    Tub Bath or Shower Instructions: Shower only   Driving Limits:     Comments: May drive when physical actions of driving are not painful.   Full Code     Home Health Skilled Nursing     Details:    Home Health Skilled Nursing Service: Medication   Home Health Skilled Nursing     Details:    Home Health Skilled Nursing Service: Medication   Lifting Limits:     Details:    Lifting Limits: No lifting limits   Lifting Limits:     Details:    Lifting Limits: Do NOT lift more than 20 lbs for:   Lifting Limit Duration: 4 weeks   Occupational Therapy Home Health Coordination     Details:    Actions: Evaluate and Treat   Occupational Therapy Home Health Coordination     Details:    Actions: Evaluate and Treat   Physical Therapy Home Health Coordination     Details:    Actions: Evaluate and Treat   Physical Therapy Home Health Coordination     Details:    Actions: Evaluate and Treat   Wound or Incision Care Instructions     Details:    Wound or Incision Site 1: Abdomen   Wound or Incision Care Instruction Site 1:  Call doctor if incision is red or drains Bruising is normal Clean with soap and water          Future Appointments  Date Time Provider Department Center  08/25/2024  2:30 PM Lamar LITTIE Hobby, MD [facility] [id number]  09/01/2024  9:40 AM Eulas Winnifred Manes, MD [facility]

## 2024-08-16 ENCOUNTER — Ambulatory Visit: Admitting: Cardiology

## 2024-08-22 ENCOUNTER — Emergency Department (HOSPITAL_COMMUNITY)

## 2024-08-22 ENCOUNTER — Inpatient Hospital Stay (HOSPITAL_COMMUNITY)
Admission: EM | Admit: 2024-08-22 | Discharge: 2024-09-02 | DRG: 070 | Disposition: A | Discharge status: Home / Self Care | Attending: Internal Medicine | Admitting: Internal Medicine

## 2024-08-22 DIAGNOSIS — G9341 Metabolic encephalopathy: Secondary | ICD-10-CM | POA: Diagnosis not present

## 2024-08-22 DIAGNOSIS — E1122 Type 2 diabetes mellitus with diabetic chronic kidney disease: Secondary | ICD-10-CM | POA: Diagnosis present

## 2024-08-22 DIAGNOSIS — I5032 Chronic diastolic (congestive) heart failure: Secondary | ICD-10-CM | POA: Diagnosis present

## 2024-08-22 DIAGNOSIS — Z91030 Bee allergy status: Secondary | ICD-10-CM

## 2024-08-22 DIAGNOSIS — F0781 Postconcussional syndrome: Secondary | ICD-10-CM | POA: Diagnosis not present

## 2024-08-22 DIAGNOSIS — I132 Hypertensive heart and chronic kidney disease with heart failure and with stage 5 chronic kidney disease, or end stage renal disease: Secondary | ICD-10-CM | POA: Diagnosis present

## 2024-08-22 DIAGNOSIS — R569 Unspecified convulsions: Secondary | ICD-10-CM | POA: Diagnosis not present

## 2024-08-22 DIAGNOSIS — Y9289 Other specified places as the place of occurrence of the external cause: Secondary | ICD-10-CM

## 2024-08-22 DIAGNOSIS — G8929 Other chronic pain: Secondary | ICD-10-CM | POA: Diagnosis present

## 2024-08-22 DIAGNOSIS — Z955 Presence of coronary angioplasty implant and graft: Secondary | ICD-10-CM

## 2024-08-22 DIAGNOSIS — G928 Other toxic encephalopathy: Secondary | ICD-10-CM | POA: Diagnosis not present

## 2024-08-22 DIAGNOSIS — I5030 Unspecified diastolic (congestive) heart failure: Secondary | ICD-10-CM | POA: Diagnosis present

## 2024-08-22 DIAGNOSIS — E876 Hypokalemia: Secondary | ICD-10-CM | POA: Diagnosis not present

## 2024-08-22 DIAGNOSIS — Z95818 Presence of other cardiac implants and grafts: Secondary | ICD-10-CM

## 2024-08-22 DIAGNOSIS — Z794 Long term (current) use of insulin: Secondary | ICD-10-CM

## 2024-08-22 DIAGNOSIS — N2581 Secondary hyperparathyroidism of renal origin: Secondary | ICD-10-CM | POA: Diagnosis present

## 2024-08-22 DIAGNOSIS — R471 Dysarthria and anarthria: Secondary | ICD-10-CM | POA: Diagnosis present

## 2024-08-22 DIAGNOSIS — I4821 Permanent atrial fibrillation: Secondary | ICD-10-CM | POA: Diagnosis present

## 2024-08-22 DIAGNOSIS — Z951 Presence of aortocoronary bypass graft: Secondary | ICD-10-CM

## 2024-08-22 DIAGNOSIS — Z87891 Personal history of nicotine dependence: Secondary | ICD-10-CM

## 2024-08-22 DIAGNOSIS — N179 Acute kidney failure, unspecified: Secondary | ICD-10-CM | POA: Diagnosis present

## 2024-08-22 DIAGNOSIS — N186 End stage renal disease: Secondary | ICD-10-CM | POA: Diagnosis present

## 2024-08-22 DIAGNOSIS — R29712 NIHSS score 12: Secondary | ICD-10-CM | POA: Diagnosis present

## 2024-08-22 DIAGNOSIS — Z8249 Family history of ischemic heart disease and other diseases of the circulatory system: Secondary | ICD-10-CM

## 2024-08-22 DIAGNOSIS — I680 Cerebral amyloid angiopathy: Secondary | ICD-10-CM | POA: Diagnosis present

## 2024-08-22 DIAGNOSIS — R54 Age-related physical debility: Secondary | ICD-10-CM | POA: Diagnosis present

## 2024-08-22 DIAGNOSIS — W19XXXA Unspecified fall, initial encounter: Secondary | ICD-10-CM | POA: Diagnosis not present

## 2024-08-22 DIAGNOSIS — Z992 Dependence on renal dialysis: Secondary | ICD-10-CM

## 2024-08-22 DIAGNOSIS — E039 Hypothyroidism, unspecified: Secondary | ICD-10-CM | POA: Diagnosis present

## 2024-08-22 DIAGNOSIS — Z66 Do not resuscitate: Secondary | ICD-10-CM | POA: Diagnosis present

## 2024-08-22 DIAGNOSIS — E782 Mixed hyperlipidemia: Secondary | ICD-10-CM | POA: Diagnosis present

## 2024-08-22 DIAGNOSIS — Z7984 Long term (current) use of oral hypoglycemic drugs: Secondary | ICD-10-CM

## 2024-08-22 DIAGNOSIS — E1165 Type 2 diabetes mellitus with hyperglycemia: Secondary | ICD-10-CM | POA: Diagnosis present

## 2024-08-22 DIAGNOSIS — Z515 Encounter for palliative care: Secondary | ICD-10-CM

## 2024-08-22 DIAGNOSIS — G9340 Encephalopathy, unspecified: Secondary | ICD-10-CM | POA: Diagnosis present

## 2024-08-22 DIAGNOSIS — Z833 Family history of diabetes mellitus: Secondary | ICD-10-CM

## 2024-08-22 DIAGNOSIS — D631 Anemia in chronic kidney disease: Secondary | ICD-10-CM | POA: Diagnosis present

## 2024-08-22 DIAGNOSIS — E854 Organ-limited amyloidosis: Secondary | ICD-10-CM | POA: Diagnosis present

## 2024-08-22 DIAGNOSIS — Z79890 Hormone replacement therapy (postmenopausal): Secondary | ICD-10-CM

## 2024-08-22 DIAGNOSIS — Z7982 Long term (current) use of aspirin: Secondary | ICD-10-CM

## 2024-08-22 DIAGNOSIS — I2510 Atherosclerotic heart disease of native coronary artery without angina pectoris: Secondary | ICD-10-CM | POA: Diagnosis present

## 2024-08-22 DIAGNOSIS — R410 Disorientation, unspecified: Principal | ICD-10-CM

## 2024-08-22 DIAGNOSIS — E871 Hypo-osmolality and hyponatremia: Secondary | ICD-10-CM | POA: Diagnosis present

## 2024-08-22 DIAGNOSIS — Z79899 Other long term (current) drug therapy: Secondary | ICD-10-CM

## 2024-08-22 LAB — DIFFERENTIAL
Abs Immature Granulocytes: 0.05 K/uL (ref 0.00–0.07)
Basophils Absolute: 0 K/uL (ref 0.0–0.1)
Basophils Relative: 0 %
Eosinophils Absolute: 0.2 K/uL (ref 0.0–0.5)
Eosinophils Relative: 2 %
Immature Granulocytes: 1 %
Lymphocytes Relative: 6 %
Lymphs Abs: 0.5 K/uL — ABNORMAL LOW (ref 0.7–4.0)
Monocytes Absolute: 0.5 K/uL (ref 0.1–1.0)
Monocytes Relative: 6 %
Neutro Abs: 7.3 K/uL (ref 1.7–7.7)
Neutrophils Relative %: 85 %

## 2024-08-22 LAB — PROTIME-INR
INR: 1.2 (ref 0.8–1.2)
Prothrombin Time: 15.8 s — ABNORMAL HIGH (ref 11.4–15.2)

## 2024-08-22 LAB — APTT: aPTT: 28 s (ref 24–36)

## 2024-08-22 LAB — COMPREHENSIVE METABOLIC PANEL WITH GFR
ALT: 14 U/L (ref 0–44)
AST: 17 U/L (ref 15–41)
Albumin: 2.8 g/dL — ABNORMAL LOW (ref 3.5–5.0)
Alkaline Phosphatase: 132 U/L — ABNORMAL HIGH (ref 38–126)
Anion gap: 11 (ref 5–15)
BUN: 28 mg/dL — ABNORMAL HIGH (ref 8–23)
CO2: 29 mmol/L (ref 22–32)
Calcium: 7.9 mg/dL — ABNORMAL LOW (ref 8.9–10.3)
Chloride: 93 mmol/L — ABNORMAL LOW (ref 98–111)
Creatinine, Ser: 2.61 mg/dL — ABNORMAL HIGH (ref 0.61–1.24)
GFR, Estimated: 25 mL/min — ABNORMAL LOW (ref >60)
Glucose, Bld: 239 mg/dL — ABNORMAL HIGH (ref 70–99)
Potassium: 3.2 mmol/L — ABNORMAL LOW (ref 3.5–5.1)
Sodium: 133 mmol/L — ABNORMAL LOW (ref 135–145)
Total Bilirubin: 1.4 mg/dL — ABNORMAL HIGH (ref 0.0–1.2)
Total Protein: 6.3 g/dL — ABNORMAL LOW (ref 6.5–8.1)

## 2024-08-22 LAB — I-STAT CHEM 8, ED
BUN: 34 mg/dL — ABNORMAL HIGH (ref 8–23)
Calcium, Ion: 1.03 mmol/L — ABNORMAL LOW (ref 1.15–1.40)
Chloride: 92 mmol/L — ABNORMAL LOW (ref 98–111)
Creatinine, Ser: 2.6 mg/dL — ABNORMAL HIGH (ref 0.61–1.24)
Glucose, Bld: 233 mg/dL — ABNORMAL HIGH (ref 70–99)
HCT: 28 % — ABNORMAL LOW (ref 39.0–52.0)
Hemoglobin: 9.5 g/dL — ABNORMAL LOW (ref 13.0–17.0)
Potassium: 3.3 mmol/L — ABNORMAL LOW (ref 3.5–5.1)
Sodium: 136 mmol/L (ref 135–145)
TCO2: 32 mmol/L (ref 22–32)

## 2024-08-22 LAB — CBC
HCT: 27.9 % — ABNORMAL LOW (ref 39.0–52.0)
Hemoglobin: 8.8 g/dL — ABNORMAL LOW (ref 13.0–17.0)
MCH: 29 pg (ref 26.0–34.0)
MCHC: 31.5 g/dL (ref 30.0–36.0)
MCV: 92.1 fL (ref 80.0–100.0)
Platelets: 159 K/uL (ref 150–400)
RBC: 3.03 MIL/uL — ABNORMAL LOW (ref 4.22–5.81)
RDW: 17.4 % — ABNORMAL HIGH (ref 11.5–15.5)
WBC: 8.7 K/uL (ref 4.0–10.5)
nRBC: 0 % (ref 0.0–0.2)

## 2024-08-22 LAB — ETHANOL: Alcohol, Ethyl (B): <15 mg/dL (ref <15)

## 2024-08-22 LAB — I-STAT CG4 LACTIC ACID, ED: Lactic Acid, Venous: 0.8 mmol/L (ref 0.5–1.9)

## 2024-08-22 LAB — CBG MONITORING, ED: Glucose-Capillary: 227 mg/dL — ABNORMAL HIGH (ref 70–99)

## 2024-08-22 NOTE — ED Provider Notes (Signed)
 [name] EMERGENCY DEPARTMENT AT [facility] Provider Note   CSN: [id number] Arrival date & time: 08/22/24  2307  An emergency department physician performed an initial assessment on this suspected stroke patient at 2309.  Patient presents with: No chief complaint on file.   Robert Spears is a 73 y.o. male.  {Add pertinent medical, surgical, social history, OB history to YEP:67052} The history is provided by the EMS personnel. The history is limited by the condition of the patient (Altered mental status).   He has history of hypertension, diabetes, hyperlipidemia, stroke, atrial fibrillation not on anticoagulation (Watchman procedure), coronary artery disease, end-stage renal disease on hemodialysis and was brought in by EMS as a code stroke.  He is reported to have had an unwitnessed fall at the skilled nursing facility where he resides.  About 15 minutes later, he was found unresponsive.  EMS reports initially he was responsive to ammonia but only minimally so but has been waking up.  He initially seemed to have decreased grip strength on the right and questionable right facial droop which have resolved.  However, he is nowhere near his baseline.    Prior to Admission medications   Medication Sig Start Date End Date Taking? Authorizing Provider  amLODipine  (NORVASC ) 10 MG tablet Take 1 tablet by mouth once daily 06/22/24   Krasowski, Robert J, MD  aspirin  325 MG tablet Take 325 mg by mouth daily.    [provider]  atorvastatin  (LIPITOR) 80 MG tablet Take 80 mg by mouth daily.  01/24/16   [provider]  carvedilol  (COREG ) 25 MG tablet Take 1 tablet (25 mg total) by mouth 2 (two) times daily. 11/05/23 02/15/24  Krasowski, Robert J, MD  dapagliflozin propanediol (FARXIGA) 5 MG TABS tablet Take 5 mg by mouth daily.    [provider]  EPINEPHrine  0.3 mg/0.3 mL IJ SOAJ injection Inject 0.3 mg into the muscle as needed for anaphylaxis.    [provider]  ergocalciferol (VITAMIN D2) 1.25 MG (50000 UT) capsule Take 1 capsule by mouth once a week. 02/07/21   [provider]  ezetimibe  (ZETIA ) 10 MG tablet Take 1 tablet by mouth once daily 06/22/24   Krasowski, Robert J, MD  furosemide  (LASIX ) 40 MG tablet Take 1 tablet by mouth twice daily 06/22/24   Krasowski, Robert J, MD  hydrALAZINE  (APRESOLINE ) 50 MG tablet Take 1 tablet (50 mg total) by mouth 3 (three) times daily. 05/24/24   Carlin Delon BROCKS, NP  levothyroxine  (SYNTHROID ) 50 MCG tablet Take 50 mcg by mouth daily before breakfast.    [provider]  metFORMIN (GLUCOPHAGE) 1000 MG tablet Take 1,000 mg by mouth daily with breakfast.    [provider]  Multiple Vitamin (MULTI-VITAMINS) TABS Take 1 tablet by mouth daily. Unknown strehgth    [provider]  nitroGLYCERIN  (NITROSTAT ) 0.4 MG SL tablet Place 0.4 mg under the tongue every 5 (five) minutes as needed for chest pain.    [provider]  RYBELSUS 7 MG TABS Take 1 tablet by mouth every morning.    [provider]    Allergies: Bee venom    Review of Systems  Unable to perform ROS: Mental status change    Updated Vital Signs Wt 95 kg   BMI 27.63 kg/m   Physical Exam Vitals and nursing note reviewed.   73 year old male, resting comfortably and in no acute distress. Vital signs are ***. Oxygen saturation is ***%, which is normal.  Head is normocephalic and atraumatic. PERRLA, EOMI.  Neck is nontender and supple without adenopathy.  There are no carotid bruits. Lungs are clear without rales, wheezes, or rhonchi. Chest is nontender.  Dialysis access catheters are present on the left. Heart has regular rate and rhythm without murmur. Abdomen is soft, flat, nontender. Extremities have no cyanosis or edema. Skin is warm and dry without rash. Neurologic: Sleepy but arousable, does follow simple commands, completely disoriented.  He is a poorly cooperative with motor  exam but strength is symmetric on both arms and both legs, no Babinski reflex.  (all labs ordered are listed, but only abnormal results are displayed) Labs Reviewed  PROTIME-INR - Abnormal; Notable for the following components:      Result Value   Prothrombin Time 15.8 (*)    All other components within normal limits  CBC - Abnormal; Notable for the following components:   RBC 3.03 (*)    Hemoglobin 8.8 (*)    HCT 27.9 (*)    RDW 17.4 (*)    All other components within normal limits  DIFFERENTIAL - Abnormal; Notable for the following components:   Lymphs Abs 0.5 (*)    All other components within normal limits  CBG MONITORING, ED - Abnormal; Notable for the following components:   Glucose-Capillary 227 (*)    All other components within normal limits  I-STAT CHEM 8, ED - Abnormal; Notable for the following components:   Potassium 3.3 (*)    Chloride 92 (*)    BUN 34 (*)    Creatinine, Ser 2.60 (*)    Glucose, Bld 233 (*)    Calcium , Ion 1.03 (*)    Hemoglobin 9.5 (*)    HCT 28.0 (*)    All other components within normal limits  ETHANOL  APTT  COMPREHENSIVE METABOLIC PANEL WITH GFR  RAPID URINE DRUG SCREEN, HOSP PERFORMED  CK  I-STAT CG4 LACTIC ACID, ED    EKG: None  Radiology: CT HEAD CODE STROKE WO CONTRAST Result Date: 08/22/2024 CLINICAL DATA:  Code stroke. Initial evaluation for acute neuro deficit, stroke suspected. EXAM: CT HEAD WITHOUT CONTRAST TECHNIQUE: Contiguous axial images were obtained from the base of the skull through the vertex without intravenous contrast. RADIATION DOSE REDUCTION: This exam was performed according to the departmental dose-optimization program which includes automated exposure control, adjustment of the mA and/or kV according to patient size and/or use of iterative reconstruction technique. COMPARISON:  Prior study from 12/25/2018 FINDINGS: Brain: Generalized age-related cerebral atrophy with chronic microvascular ischemic disease. Large  remote left MCA distribution infarct involving the left cerebral hemisphere again seen, stable. Remote lacunar infarct at the left frontal corona radiata. No acute intracranial hemorrhage. No acute large vessel territory infarct. No mass lesion or midline shift. Ex vacuo dilatation of the left lower ventricle without hydrocephalus. No extra-axial fluid collection. Vascular: No abnormal hyperdense vessel. Calcified atherosclerosis present at the skull base. Skull: Scalp soft tissues demonstrate no acute finding. Calvarium intact. Sinuses/Orbits: Globes and orbital soft tissues within normal limits. Small left maxillary sinus retention cyst. Paranasal sinuses mastoid air cells are otherwise clear. Other: None. ASPECTS [facility] Stroke Program Early CT Score) - Ganglionic level infarction (caudate, lentiform nuclei, internal capsule, insula, M1-M3 cortex): 7 - Supraganglionic infarction (M4-M6 cortex): 3 Total score (0-10 with 10 being normal): 10 IMPRESSION: 1. No acute intracranial abnormality. 2. Aspects is 10. 3. 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 These results were communicated  to Dr. Lindzen at 11:26 p.m. on 08/22/2024 by text page via the [facility] messaging system. Electronically Signed   By: Morene Hoard M.D.   On: 08/22/2024 23:29    {Document cardiac monitor, telemetry assessment procedure when appropriate:32947} Procedures   Medications Ordered in the ED - No data to display    {Click here for ABCD2, HEART and other calculators REFRESH Note before signing:1}                              Medical Decision Making Amount and/or Complexity of Data Reviewed Labs: ordered. Radiology: ordered.   Unwitnessed fall at nursing facility followed by episode of unresponsiveness.  At this point, no findings to suggest stroke.  Patient was seen in conjunction with Dr. Merrianne of neurology service who also agrees that there is no  evidence of stroke, so code stroke was canceled.  I am concerned about possible seizure with unresponsive episode which is gradually improving.  CT of head shows old stroke, but no acute findings.  Have independently viewed the images, and agree with the radiologist's interpretation.  I have reviewed his past records, and note hospitalization 07/15/2022 for stroke.  {Document critical care time when appropriate  Document review of labs and clinical decision tools ie CHADS2VASC2, etc  Document your independent review of radiology images and any outside records  Document your discussion with family members, caretakers and with consultants  Document social determinants of health affecting pt's care  Document your decision making why or why not admission, treatments were needed:32947:::1}   Final diagnoses:  None    ED Discharge Orders     None

## 2024-08-22 NOTE — Consult Note (Signed)
 NEUROLOGY CONSULT NOTE   Date of service: August 22, 2024 [name] MRN:  [id number] DOB:  10/16/1951 Chief Complaint: Robert Spears after a fall  Requesting Provider: Raford Lenis, MD  History of Present Illness  Robert Spears is a 73 y.o. 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 He was found on the floor near his bed by staff and placed in a chair, then was noted to be unresponsive 15 minutes later. EMS noted possible right arm weakness in conjunction with poor responsiveness to commands. BP per EMS was 174/palp. Code Stroke called by EMS. He rapidly improved in terms of his level of consciousness en route, but remained confused. Uncertain baseline other than that he is nonambulatory at the SNF. No seizure activity noted. No history of seizures.   He is seen outpatient by Neurology, with last visit in Epic documented in a clinic note dated 04/06/23. Note has been reviewed, including the following: Patient is here today with wife. He remains very active; works on [employer], garden,ing keeping himself  busy, making breakfast for her wife. No new stroke/TIA symptoms Reports that he continues to have left hand weakness and numbness, left foot numbness, slurred speech, makes intermittent paraphasic errors. Overall his symptoms art stable. Completed [facility] rehab.  Denies falls.  Currently on ASA 325mg , previously was on Plavix post LAAC. Continues to monitor BP at home 130-150, currently on Norvasc  10mg , Coreg  12.5mg  BID and Hydralazine  50mg  BID.  Stroke History  07/2022 ; Left corona radiata infarction.He was treated at [facility] and his ASA  was increased to 325mg  daily.  09/2020: LAAO 08/2020: Left parietal lobe infarction while off anticoagulation. 06/2020:  Left occipital IPH while on Eliquis  for Afib. 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 DOAC discontinued.   Stroke Work Up MRI brain (07/15/22) : Small left corona radiata infarction  CTA (08/01/22) : Moderate bilateral carotid stenosis (left >right), mild to moderate bilateral intracranial cavernous carotid stenosis MRI Brain (08/16/20): L MCA infraction, resolved left occipital lobe IPH, multiple foci of chronic microhemorrhages.  Redemonstrated sclerotic lesion in the left parietal bone no enhancement, may represent benign focal sclerosis. 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 CTA (06/19/2020): Mild bilateral carotid stenosis, mild to moderate stenosis of right P2 TEE (11/19/2020): Normal LVEF, LAAO device is well-seated without significant peridevice leak; small peridevice leak, measured 0.35 mm in diameter.   LKW: 2215 Modified rankin score: Unknown but is non-ambulatory at SNF per report.  IV Thrombolysis/EVT: No: Presentation is not consistent with stroke   NIHSS components Score: Comment  1a Level of Conscious 0[]  1[x]  2[]  3[]       1b LOC Questions 0[]  1[]  2[x]      Poor orientation to place, time and circumstance.   1c LOC Commands 0[x]  1[]  2[]      Cannot follow complex commands.   2 Best Gaze 0[x]  1[]  2[]       3 Visual 0[x]  1[]  2[]  3[]      4 Facial Palsy 0[x]  1[]  2[]  3[]      5a Motor Arm - left 0[x]  1[]  2[]  3[]   4[]  UN[]    5b Motor Arm - Right 0[]  1[x]  2[]  3[]  4[]  UN[]   Slight drift and 4/5 strength  6a Motor Leg - Left 0[]  1[]  2[]  3[x]  4[]  UN[]   Will withdraw weakly to pinch and exclaim with discomfort  6b Motor Leg - Right 0[]  1[]  2[]  3[x]  4[]  UN[]   Will withdraw weakly to pinch and exclaim with discomfort  7 Limb Ataxia 0[x]  1[]  2[]  UN[]      8 Sensory 0[x]   1[]  2[]  UN[]     Reacts to pinch x 4. No gross asymmetry.   9 Best Language 0[]  1[]  2[x]  3[]     Pattern is most consistent with encephalopathic state and possible pre-existing aphasia from old left temporal lobe stroke. 1-2 word responses only.   10 Dysarthria 0[x]  1[]  2[]  UN[]      11 Extinct. and Inattention 0[x]  1[]  2[]       TOTAL:   12      ROS  Unable to ascertain due to AMS.   Past History   Past Medical History:  Diagnosis Date   A-fib [facility])    Acute laryngotracheitis 11/05/2020   Formatting of this note might be different from the original. 11/05/2020   Allergy to bee sting    Benign hypertension with CKD (chronic kidney disease) stage III (HCC) 03/22/2019   Cerebral amyloid angiopathy (HCC) 01/22/2016   Formatting of this note might be different from the original. 2016: RIGHT ARM NUMB, RIGHT FACE WEAK, EXP DYSPHASIA; MOD CAROTID PLAQUE, AFIB 2018: residual numbness fingers right hand 2021: left occ-temp hemorrhage on NOAC with ex/rec dysphasia, lower facial droop, LUE weakness   Chronic bilateral low back pain without sciatica 08/01/2016   Formatting of this note might be different from the original. 2018: surg   Coronary artery disease 01/21/2016   Formatting of this note might be different from the original. Managed CARDS (1997) onset (2005) PCI of RCA, PCI with DES of RCA  post CABS  Formatting of this note might be different from the original. Managed CARDS (1997) onset (2005) PCI of RCA, PCI with DES of RCA  post CABG   Coronary artery disease involving native coronary artery of native heart without angina pectoris 08/09/2015   DDD (degenerative disc disease), lumbar 08/24/2017   Formatting of this note might be different from the original. 2018: surgery   Diabetes mellitus    Dyslipidemia 08/09/2015   Family history of premature coronary artery disease 09/17/2018   Formatting of this note might be different from the original. Brother   Hemiparesis of right dominant side (HCC)  07/24/2020   Formatting of this note might be different from the original. 2021: left occ-temp hemorrhage   High blood pressure    High cholesterol    Hypertension 10/02/2015   Intracranial bleeding (HCC) 06/28/2020   Intraparenchymal hemorrhage of brain (HCC) 06/23/2020   Low vitamin B12 level 07/24/2020   Formatting of this note might be different from the original. 2021: 236   Mixed hyperlipidemia 01/21/2016   Formatting of this note might be different from the original. 01/2015 Not at goal LDL 79 HDL 26 (LDL 70 HDL 40)   Obesity (BMI 30-39.9) 11/01/2020   Permanent atrial fibrillation (HCC) 01/22/2016   Formatting of this note might be different from the original. Managed CARDS 2014: DX CHADS2=2 RX ACT   Presence of Watchman left atrial appendage closure device 10/06/2020   Proteinuria 11/01/2020   Status post coronary artery bypass graft 05/11/2020   Stroke (HCC)    Subclinical  hypothyroidism 07/30/2020   Swelling of both lower extremities 06/28/2020   TIA (transient ischemic attack) 08/21/2015   Type 2 diabetes mellitus with stage 3b chronic kidney disease, without long-term current use of insulin  (HCC) 01/21/2016   Overview:  04/2015 A1C has increased to 7.6   Uncontrolled hypertension 02/07/2021   Weight loss    Wellness examination 03/09/2020    Past Surgical History:  Procedure Laterality Date   ARTERIAL BYPASS SURGRY     CARDIAC SURGERY     CORONARY ARTERY BYPASS GRAFT     CORONARY STENT PLACEMENT     LEFT ATRIAL APPENDAGE OCCLUSION  10/09/2020    Family History: Family History  Problem Relation Age of Onset   Cancer Sister        Bone   Diabetes Mother    High blood pressure Mother    Heart disease Mother 76   Heart attack Brother    Diabetes Brother    High blood pressure Brother    Heart disease Brother    Other Father 71       MVA   Heart disease Sister    Diabetes Sister     Social History  reports that he quit smoking about 28 years ago. His smoking use included  cigarettes. He has quit using smokeless tobacco. He reports that he does not drink alcohol and does not use drugs.  Allergies  Allergen Reactions   Bee Venom     Medications  No current facility-administered medications for this encounter.  Current Outpatient Medications:    amLODipine  (NORVASC ) 10 MG tablet, Take 1 tablet by mouth once daily, Disp: 90 tablet, Rfl: 0   aspirin  325 MG tablet, Take 325 mg by mouth daily., Disp: , Rfl:    atorvastatin  (LIPITOR) 80 MG tablet, Take 80 mg by mouth daily. , Disp: , Rfl:    carvedilol  (COREG ) 25 MG tablet, Take 1 tablet (25 mg total) by mouth 2 (two) times daily., Disp: 180 tablet, Rfl: 3   dapagliflozin propanediol (FARXIGA) 5 MG TABS tablet, Take 5 mg by mouth daily., Disp: , Rfl:    EPINEPHrine  0.3 mg/0.3 mL IJ SOAJ injection, Inject 0.3 mg into the muscle as needed for anaphylaxis., Disp: , Rfl:    ergocalciferol (VITAMIN D2) 1.25 MG (50000 UT) capsule, Take 1 capsule by mouth once a week., Disp: , Rfl:    ezetimibe  (ZETIA ) 10 MG tablet, Take 1 tablet by mouth once daily, Disp: 90 tablet, Rfl: 0   furosemide  (LASIX ) 40 MG tablet, Take 1 tablet by mouth twice daily, Disp: 180 tablet, Rfl: 0   hydrALAZINE  (APRESOLINE ) 50 MG tablet, Take 1 tablet (50 mg total) by mouth 3 (three) times daily., Disp: 270 tablet, Rfl: 1   levothyroxine  (SYNTHROID ) 50 MCG tablet, Take 50 mcg by mouth daily before breakfast., Disp: , Rfl:    metFORMIN (GLUCOPHAGE) 1000 MG tablet, Take 1,000 mg by mouth daily with breakfast., Disp: , Rfl:    Multiple Vitamin (MULTI-VITAMINS) TABS, Take 1 tablet by mouth daily. Unknown strehgth, Disp: , Rfl:    nitroGLYCERIN  (NITROSTAT ) 0.4 MG SL tablet, Place 0.4 mg under the tongue every 5 (five) minutes as needed for chest pain., Disp: , Rfl:    RYBELSUS 7 MG TABS, Take 1 tablet by mouth every morning., Disp: , Rfl:   Vitals   BP 137/87 (BP Location: Left Arm)   Pulse 75   Temp 98.9 F (37.2 C) (Oral)   Resp 20   Ht 6' 1  (  1.854 m)   Wt 95 kg   SpO2 97%   BMI 27.63 kg/m     Physical Exam   Constitutional: Appears well-developed and well-nourished.  Eyes: No scleral injection.  HENT: No OP obstruction.  Head: Normocephalic.  Respiratory: At times with mild SOB.  Ext: Mild pitting edema to distal BLE. Recent abrasion to right shin.   Neurologic Examination   See NIHSS  Labs/Imaging/Neurodiagnostic studies   CBC: No results for input(s): WBC, NEUTROABS, HGB, HCT, MCV, PLT in the last 168 hours. Basic Metabolic Panel:  Lab Results  Component Value Date   NA 139 05/31/2021   K 4.2 05/31/2021   CO2 19 (L) 05/31/2021   GLUCOSE 152 (H) 05/31/2021   BUN 32 (H) 05/31/2021   CREATININE 1.98 (H) 05/31/2021   CALCIUM  9.8 05/31/2021   GFRNONAA 41 (L) 07/03/2020   GFRAA 48 (L) 07/03/2020   Recent carotid ultrasound (02/17/24):  - Velocities in the right ICA are consistent with a 1-39%  stenosis.  - Left Carotid: Velocities in the left ICA are consistent with a 1-39%  stenosis.  - Vertebrals: Bilateral vertebral arteries demonstrate antegrade flow.  - Subclavians: Normal flow hemodynamics were seen in bilateral subclavian arteries.   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 He was found on the floor near his bed by staff and placed in a chair, then was noted to be unresponsive 15 minutes later. EMS noted possible right arm weakness in conjunction with poor responsiveness to commands. BP per EMS was 174/palp. Code Stroke called by EMS. He rapidly improved in terms of his level of consciousness en route, but remained confused.  Uncertain baseline other than that he is nonambulatory at the SNF. No seizure activity noted. No history of seizures.  - Exam reveals confusion and dysphasia. He can only follow some simple commands. Some RUE weakness is noted, most likely chronic from prior stroke.  - CT head: No acute intracranial abnormality. Aspects is 10. Underlying atrophy with chronic small vessel ischemic disease and large remote left MCA territory infarct, stable. - Numerous derangements noted on CMP. No leukocytosis on CBC. Lactate is normal.   - Impression: Fall followed by acute unresponsiveness 15 minutes later. Rapidly improved with EMS but still confused. Uncertain baseline. No history of seizures. Has old large left temporal lobe ischemic infarction. DDx includes unwitnessed seizure with postictal state versus postconcussive syndrome versus toxic/metabolic encephalopathy.   RECOMMENDATIONS  - MRI brain - EEG (ordered) - Correction of electrolytes and work up of underlying etiologies.  ______________________________________________________________________    Bonney SHARK, Kaydence Menard, MD [facility]

## 2024-08-23 ENCOUNTER — Inpatient Hospital Stay (HOSPITAL_COMMUNITY)

## 2024-08-23 ENCOUNTER — Encounter (HOSPITAL_COMMUNITY): Payer: Self-pay | Admitting: Internal Medicine

## 2024-08-23 ENCOUNTER — Other Ambulatory Visit: Payer: Self-pay

## 2024-08-23 DIAGNOSIS — R410 Disorientation, unspecified: Secondary | ICD-10-CM | POA: Diagnosis not present

## 2024-08-23 DIAGNOSIS — I4821 Permanent atrial fibrillation: Secondary | ICD-10-CM | POA: Diagnosis present

## 2024-08-23 DIAGNOSIS — E782 Mixed hyperlipidemia: Secondary | ICD-10-CM | POA: Diagnosis present

## 2024-08-23 DIAGNOSIS — E1165 Type 2 diabetes mellitus with hyperglycemia: Secondary | ICD-10-CM | POA: Diagnosis present

## 2024-08-23 DIAGNOSIS — N186 End stage renal disease: Secondary | ICD-10-CM

## 2024-08-23 DIAGNOSIS — E871 Hypo-osmolality and hyponatremia: Secondary | ICD-10-CM | POA: Diagnosis present

## 2024-08-23 DIAGNOSIS — Z794 Long term (current) use of insulin: Secondary | ICD-10-CM | POA: Diagnosis not present

## 2024-08-23 DIAGNOSIS — R54 Age-related physical debility: Secondary | ICD-10-CM | POA: Diagnosis present

## 2024-08-23 DIAGNOSIS — Z992 Dependence on renal dialysis: Secondary | ICD-10-CM | POA: Diagnosis not present

## 2024-08-23 DIAGNOSIS — G9340 Encephalopathy, unspecified: Secondary | ICD-10-CM | POA: Diagnosis not present

## 2024-08-23 DIAGNOSIS — Z8673 Personal history of transient ischemic attack (TIA), and cerebral infarction without residual deficits: Secondary | ICD-10-CM | POA: Diagnosis not present

## 2024-08-23 DIAGNOSIS — R569 Unspecified convulsions: Secondary | ICD-10-CM | POA: Diagnosis not present

## 2024-08-23 DIAGNOSIS — Y9289 Other specified places as the place of occurrence of the external cause: Secondary | ICD-10-CM | POA: Diagnosis not present

## 2024-08-23 DIAGNOSIS — Z515 Encounter for palliative care: Secondary | ICD-10-CM | POA: Diagnosis not present

## 2024-08-23 DIAGNOSIS — R29712 NIHSS score 12: Secondary | ICD-10-CM | POA: Diagnosis present

## 2024-08-23 DIAGNOSIS — N189 Chronic kidney disease, unspecified: Secondary | ICD-10-CM | POA: Diagnosis not present

## 2024-08-23 DIAGNOSIS — N185 Chronic kidney disease, stage 5: Secondary | ICD-10-CM

## 2024-08-23 DIAGNOSIS — E876 Hypokalemia: Secondary | ICD-10-CM | POA: Diagnosis present

## 2024-08-23 DIAGNOSIS — G9341 Metabolic encephalopathy: Secondary | ICD-10-CM | POA: Diagnosis present

## 2024-08-23 DIAGNOSIS — I5030 Unspecified diastolic (congestive) heart failure: Secondary | ICD-10-CM | POA: Diagnosis present

## 2024-08-23 DIAGNOSIS — W19XXXA Unspecified fall, initial encounter: Secondary | ICD-10-CM | POA: Diagnosis present

## 2024-08-23 DIAGNOSIS — E039 Hypothyroidism, unspecified: Secondary | ICD-10-CM

## 2024-08-23 DIAGNOSIS — R471 Dysarthria and anarthria: Secondary | ICD-10-CM | POA: Diagnosis present

## 2024-08-23 DIAGNOSIS — I2510 Atherosclerotic heart disease of native coronary artery without angina pectoris: Secondary | ICD-10-CM

## 2024-08-23 DIAGNOSIS — G8929 Other chronic pain: Secondary | ICD-10-CM | POA: Diagnosis present

## 2024-08-23 DIAGNOSIS — I132 Hypertensive heart and chronic kidney disease with heart failure and with stage 5 chronic kidney disease, or end stage renal disease: Secondary | ICD-10-CM | POA: Diagnosis present

## 2024-08-23 DIAGNOSIS — E1122 Type 2 diabetes mellitus with diabetic chronic kidney disease: Secondary | ICD-10-CM | POA: Diagnosis present

## 2024-08-23 DIAGNOSIS — Z66 Do not resuscitate: Secondary | ICD-10-CM | POA: Diagnosis present

## 2024-08-23 DIAGNOSIS — N2581 Secondary hyperparathyroidism of renal origin: Secondary | ICD-10-CM | POA: Diagnosis present

## 2024-08-23 DIAGNOSIS — N179 Acute kidney failure, unspecified: Secondary | ICD-10-CM | POA: Diagnosis present

## 2024-08-23 DIAGNOSIS — I5032 Chronic diastolic (congestive) heart failure: Secondary | ICD-10-CM | POA: Diagnosis present

## 2024-08-23 DIAGNOSIS — D631 Anemia in chronic kidney disease: Secondary | ICD-10-CM | POA: Diagnosis present

## 2024-08-23 DIAGNOSIS — E854 Organ-limited amyloidosis: Secondary | ICD-10-CM | POA: Diagnosis present

## 2024-08-23 LAB — CBG MONITORING, ED: Glucose-Capillary: 132 mg/dL — ABNORMAL HIGH (ref 70–99)

## 2024-08-23 LAB — TSH: TSH: 3.445 u[IU]/mL (ref 0.350–4.500)

## 2024-08-23 LAB — HEMOGLOBIN A1C
Hgb A1c MFr Bld: 6 % — ABNORMAL HIGH (ref 4.8–5.6)
Mean Plasma Glucose: 125.5 mg/dL

## 2024-08-23 LAB — FOLATE: Folate: 11 ng/mL (ref >5.9)

## 2024-08-23 LAB — VITAMIN B12: Vitamin B-12: 758 pg/mL (ref 180–914)

## 2024-08-23 LAB — CK: Total CK: 29 U/L — ABNORMAL LOW (ref 49–397)

## 2024-08-23 LAB — GLUCOSE, CAPILLARY
Glucose-Capillary: 99 mg/dL (ref 70–99)
Glucose-Capillary: 165 mg/dL — ABNORMAL HIGH (ref 70–99)

## 2024-08-23 MED ORDER — CARVEDILOL 25 MG PO TABS
25.0000 mg | ORAL_TABLET | Freq: Two times a day (BID) | ORAL | Status: DC
  Administered 2024-08-23 – 2024-09-02 (×19): 25 mg via ORAL
  Filled 2024-08-23 (×20): qty 1

## 2024-08-23 MED ORDER — SENNOSIDES-DOCUSATE SODIUM 8.6-50 MG PO TABS
2.0000 | ORAL_TABLET | Freq: Every day | ORAL | Status: DC
  Administered 2024-08-23 – 2024-09-02 (×10): 2 via ORAL
  Filled 2024-08-23 (×10): qty 2

## 2024-08-23 MED ORDER — SODIUM BICARBONATE 650 MG PO TABS
650.0000 mg | ORAL_TABLET | Freq: Three times a day (TID) | ORAL | Status: DC
  Administered 2024-08-23 – 2024-08-30 (×19): 650 mg via ORAL
  Filled 2024-08-23 (×18): qty 1

## 2024-08-23 MED ORDER — ONDANSETRON HCL 4 MG PO TABS: 4.0000 mg | ORAL_TABLET | Freq: Four times a day (QID) | ORAL | Status: DC | PRN

## 2024-08-23 MED ORDER — THIAMINE HCL 100 MG/ML IJ SOLN
100.0000 mg | Freq: Every day | INTRAMUSCULAR | Status: DC
  Administered 2024-08-23 – 2024-08-24 (×2): 100 mg via INTRAVENOUS
  Filled 2024-08-23 (×2): qty 2

## 2024-08-23 MED ORDER — LEVOTHYROXINE SODIUM 50 MCG PO TABS
50.0000 ug | ORAL_TABLET | Freq: Every day | ORAL | Status: DC
  Administered 2024-08-24 – 2024-09-02 (×9): 50 ug via ORAL
  Filled 2024-08-23 (×9): qty 1

## 2024-08-23 MED ORDER — POTASSIUM CHLORIDE CRYS ER 20 MEQ PO TBCR
20.0000 meq | EXTENDED_RELEASE_TABLET | ORAL | Status: AC
  Filled 2024-08-23: qty 1

## 2024-08-23 MED ORDER — ONDANSETRON HCL 4 MG/2ML IJ SOLN: 4.0000 mg | Freq: Four times a day (QID) | INTRAMUSCULAR | Status: DC | PRN

## 2024-08-23 MED ORDER — ACETAMINOPHEN 650 MG RE SUPP: 650.0000 mg | Freq: Four times a day (QID) | RECTAL | Status: DC | PRN

## 2024-08-23 MED ORDER — HEPARIN SODIUM (PORCINE) 5000 UNIT/ML IJ SOLN
5000.0000 [IU] | Freq: Three times a day (TID) | INTRAMUSCULAR | Status: DC
  Administered 2024-08-23 – 2024-09-02 (×27): 5000 [IU] via SUBCUTANEOUS
  Filled 2024-08-23 (×27): qty 1

## 2024-08-23 MED ORDER — AMLODIPINE BESYLATE 5 MG PO TABS
2.5000 mg | ORAL_TABLET | Freq: Every day | ORAL | Status: DC
  Administered 2024-08-23 – 2024-08-24 (×2): 2.5 mg via ORAL
  Filled 2024-08-23 (×3): qty 1

## 2024-08-23 MED ORDER — PROSOURCE PLUS PO LIQD
30.0000 mL | Freq: Two times a day (BID) | ORAL | Status: DC
  Administered 2024-08-23 – 2024-09-02 (×15): 30 mL via ORAL
  Filled 2024-08-23 (×18): qty 30

## 2024-08-23 MED ORDER — INSULIN ASPART 100 UNIT/ML IJ SOLN
0.0000 [IU] | Freq: Three times a day (TID) | INTRAMUSCULAR | Status: DC
  Administered 2024-08-27: 0 [IU] via SUBCUTANEOUS
  Administered 2024-08-27: 2 [IU] via SUBCUTANEOUS
  Administered 2024-08-30 – 2024-09-02 (×4): 1 [IU] via SUBCUTANEOUS

## 2024-08-23 MED ORDER — ADULT MULTIVITAMIN W/MINERALS CH
1.0000 | ORAL_TABLET | Freq: Every day | ORAL | Status: DC
  Administered 2024-08-23 – 2024-09-02 (×10): 1 via ORAL
  Filled 2024-08-23 (×9): qty 1

## 2024-08-23 MED ORDER — ACETAMINOPHEN 325 MG PO TABS
650.0000 mg | ORAL_TABLET | Freq: Four times a day (QID) | ORAL | Status: DC | PRN
  Administered 2024-08-23 – 2024-08-26 (×2): 650 mg via ORAL
  Filled 2024-08-23 (×2): qty 2

## 2024-08-23 MED ORDER — INFLUENZA VAC SPLIT HIGH-DOSE 0.5 ML IM SUSY
0.5000 mL | PREFILLED_SYRINGE | INTRAMUSCULAR | Status: DC
  Filled 2024-08-23: qty 0.5

## 2024-08-23 MED ORDER — ATORVASTATIN CALCIUM 10 MG PO TABS
20.0000 mg | ORAL_TABLET | Freq: Every day | ORAL | Status: DC
  Administered 2024-08-24 – 2024-08-31 (×8): 20 mg via ORAL
  Filled 2024-08-23 (×9): qty 2

## 2024-08-23 MED ORDER — HYDRALAZINE HCL 25 MG PO TABS
25.0000 mg | ORAL_TABLET | Freq: Three times a day (TID) | ORAL | Status: DC
  Administered 2024-08-23 – 2024-09-02 (×27): 25 mg via ORAL
  Filled 2024-08-23 (×28): qty 1

## 2024-08-23 MED ORDER — ALBUTEROL SULFATE (2.5 MG/3ML) 0.083% IN NEBU: 2.5000 mg | INHALATION_SOLUTION | Freq: Four times a day (QID) | RESPIRATORY_TRACT | Status: DC | PRN

## 2024-08-23 MED ORDER — ASPIRIN 325 MG PO TABS
325.0000 mg | ORAL_TABLET | Freq: Every day | ORAL | Status: DC
  Administered 2024-08-23 – 2024-09-02 (×10): 325 mg via ORAL
  Filled 2024-08-23 (×10): qty 1

## 2024-08-23 MED ORDER — INSULIN ASPART 100 UNIT/ML IJ SOLN
0.0000 [IU] | Freq: Three times a day (TID) | INTRAMUSCULAR | Status: DC
  Administered 2024-08-23: 1 [IU] via SUBCUTANEOUS

## 2024-08-23 MED ORDER — SODIUM CHLORIDE 0.9% FLUSH
3.0000 mL | Freq: Two times a day (BID) | INTRAVENOUS | Status: DC
  Administered 2024-08-23 – 2024-09-01 (×17): 3 mL via INTRAVENOUS

## 2024-08-23 MED ORDER — FERROUS SULFATE 325 (65 FE) MG PO TABS
325.0000 mg | ORAL_TABLET | Freq: Every day | ORAL | Status: DC
  Administered 2024-08-24 – 2024-09-02 (×8): 325 mg via ORAL
  Filled 2024-08-23 (×7): qty 1

## 2024-08-23 MED ORDER — EZETIMIBE 10 MG PO TABS
10.0000 mg | ORAL_TABLET | Freq: Every day | ORAL | Status: DC
  Administered 2024-08-23 – 2024-09-02 (×10): 10 mg via ORAL
  Filled 2024-08-23 (×10): qty 1

## 2024-08-23 MED ORDER — OLANZAPINE 5 MG PO TABS
2.5000 mg | ORAL_TABLET | Freq: Every day | ORAL | Status: DC
  Administered 2024-08-23: 2.5 mg via ORAL
  Filled 2024-08-23: qty 1

## 2024-08-23 MED ORDER — INSULIN GLARGINE 100 UNIT/ML ~~LOC~~ SOLN
8.0000 [IU] | Freq: Every day | SUBCUTANEOUS | Status: DC
  Administered 2024-08-23 – 2024-08-28 (×5): 8 [IU] via SUBCUTANEOUS
  Filled 2024-08-23 (×7): qty 0.08

## 2024-08-23 MED ORDER — POLYETHYLENE GLYCOL 3350 17 G PO PACK
17.0000 g | PACK | Freq: Every day | ORAL | Status: DC
  Administered 2024-08-23 – 2024-09-01 (×8): 17 g via ORAL
  Filled 2024-08-23 (×8): qty 1

## 2024-08-23 NOTE — ED Notes (Signed)
 Patient transported to MRI

## 2024-08-23 NOTE — Procedures (Signed)
 [name]  MRN: [id number]  Epilepsy Attending: Arlin MALVA Krebs  Referring Physician/Provider: Merrianne Locus, MD  Date: 08/23/2024 Duration: 23.22 mins  Patient history: 73yo M with an episode of unresponsiveness 15 minutes after a fall. EEG to evaluate for seizure  Level of alertness: Awake, asleep  AEDs during EEG study: None  Technical aspects: This EEG study was done with scalp electrodes positioned according to the 10-20 International system of electrode placement. Electrical activity was reviewed with band pass filter of 1-70Hz , sensitivity of 7 uV/mm, display speed of 23mm/sec with a 60Hz  notched filter applied as appropriate. EEG data were recorded continuously and digitally stored.  Video monitoring was available and reviewed as appropriate.  Description: The posterior dominant rhythm consists of 8 Hz activity of moderate voltage (25-35 uV) seen predominantly in posterior head regions, symmetric and reactive to eye opening and eye closing. Sleep was characterized by vertex waves, sleep spindles (12 to 14 Hz), maximal frontocentral region.  EEG showed continuous 3-5hz  theta- delta slowing in left centro-parietal region.  Additionally at around 0451 on 08/23/2024, EEG showed sharply contoured high amplitude 3-5 hz theta-delta slowing in left central parietal region which appeared high amplitude and sharply contoured lasting for about 2 minutes without definite evolution. Hyperventilation and photic stimulation were not performed.     ABNORMALITY - Continuous slow, left centro- parietal region  IMPRESSION: This study is suggestive of cortical dysfunction arising from left centro- parietal region likely secondary to underlying structural abnormality. No seizures or definite epileptiform discharges were seen throughout the recording.  Audreanna Torrisi O Hellena Pridgen

## 2024-08-23 NOTE — Progress Notes (Signed)
LTM EEG hooked up and running - no initial skin breakdown - push button tested - [facility] NOT monitoring. Pt is in ED 

## 2024-08-23 NOTE — Progress Notes (Signed)
 NEUROLOGY CONSULT FOLLOW UP NOTE   Date of service: August 23, 2024 [name] MRN:  [id number] DOB:  12-17-1950  Interval Hx/subjective  Patient has remained hemodynamically stable and afebrile overnight.  His wife reports his mental status is not back to baseline.  He has had no overt seizure activity. Vitals   Vitals:   08/23/24 0630 08/23/24 0645 08/23/24 0715 08/23/24 0845  BP:  (!) 172/70 (!) 158/77 (!) 161/71  Pulse: 76 (!) 47 78 75  Resp: 13 17 14 17   Temp:   98.9 F (37.2 C)   TempSrc:      SpO2: 100% 100% 100% 100%  Weight:      Height:         Body mass index is 27.63 kg/m.  Physical Exam   Constitutional: Well-developed elderly patient with some signs of muscle wasting, including temporal muscle wasting Psych: Affect appropriate to situation.  Eyes: No scleral injection.  HENT: No OP obstrucion.  Head: Normocephalic.  Cardiovascular: A-fib with frequent PVCs and transient bradycardia Respiratory: Effort normal, non-labored breathing on supplemental O2 Skin: Skin tear to right lower extremity  Neurologic Examination    NEURO:  Mental Status: Patient is able to state his name but cannot state where he is and is disoriented to situation Speech/Language: speech is with mild dysarthria and expressive as well as receptive aphasia  Cranial Nerves:  II: PERRL.   does not blink to threat on the right but is able to see finger wiggling in right field of vision III, IV, VI: EOMI  VII: Smile is symmetrical.  VIII: hearing intact to voice. IX, X: Voice is slightly dysarthric XII: tongue is midline without fasciculations. Motor: Able to move all 4 extremities with antigravity strength, but strength of lower extremities diminished Tone: is normal and bulk is normal Sensation- Intact to light touch bilaterally.   Gait- deferred   Medications  Current Facility-Administered Medications:    acetaminophen  (TYLENOL ) tablet 650 mg, 650 mg, Oral,  Q6H PRN **OR** acetaminophen  (TYLENOL ) suppository 650 mg, 650 mg, Rectal, Q6H PRN, Smith, Rondell A, MD   albuterol  (PROVENTIL ) (2.5 MG/3ML) 0.083% nebulizer solution 2.5 mg, 2.5 mg, Nebulization, Q6H PRN, Claudene, Rondell A, MD   heparin  injection 5,000 Units, 5,000 Units, Subcutaneous, Q8H, Smith, Rondell A, MD   insulin  aspart (novoLOG ) injection 0-9 Units, 0-9 Units, Subcutaneous, TID WC, Smith, Rondell A, MD   ondansetron  (ZOFRAN ) tablet 4 mg, 4 mg, Oral, Q6H PRN **OR** ondansetron  (ZOFRAN ) injection 4 mg, 4 mg, Intravenous, Q6H PRN, Smith, Rondell A, MD   potassium chloride  SA (KLOR-CON  M) CR tablet 20 mEq, 20 mEq, Oral, STAT, Smith, Rondell A, MD   sodium chloride  flush (NS) 0.9 % injection 3 mL, 3 mL, Intravenous, Q12H, Smith, Rondell A, MD  Current Outpatient Medications:    acetaminophen  (TYLENOL ) 500 MG tablet, Take 500 mg by mouth every 6 (six) hours as needed for mild pain (pain score 1-3), headache or fever., Disp: , Rfl:    Amino Acids-Protein Hydrolys (PRO-STAT) LIQD, Take 30 mLs by mouth in the morning and at bedtime., Disp: , Rfl:    amLODipine  (NORVASC ) 2.5 MG tablet, Take 1 tablet by mouth., Disp: , Rfl:    aspirin  325 MG tablet, Take 325 mg by mouth daily., Disp: , Rfl:    atorvastatin  (LIPITOR) 20 MG tablet, Take 20 mg by mouth daily at 6 PM., Disp: , Rfl:    carvedilol  (COREG ) 25 MG tablet, Take 1 tablet (25 mg total)  by mouth 2 (two) times daily., Disp: 180 tablet, Rfl: 3   EPINEPHrine  0.3 mg/0.3 mL IJ SOAJ injection, Inject 0.3 mg into the muscle as needed for anaphylaxis., Disp: , Rfl:    ergocalciferol (VITAMIN D2) 1.25 MG (50000 UT) capsule, Take 1 capsule by mouth once a week. Take every Tuesday, Disp: , Rfl:    ezetimibe  (ZETIA ) 10 MG tablet, Take 1 tablet by mouth once daily, Disp: 90 tablet, Rfl: 0   ferrous sulfate  325 (65 FE) MG tablet, Take 325 mg by mouth in the morning and at bedtime., Disp: , Rfl:    hydrALAZINE  (APRESOLINE ) 25 MG tablet, Take 25 mg by mouth 3  (three) times daily., Disp: , Rfl:    insulin  glargine (LANTUS ) 100 UNIT/ML injection, Inject 8 Units into the skin at bedtime., Disp: , Rfl:    levothyroxine  (SYNTHROID ) 50 MCG tablet, Take 50 mcg by mouth daily before breakfast., Disp: , Rfl:    Multiple Vitamin (MULTI-VITAMINS) TABS, Take 1 tablet by mouth daily. Unknown strehgth, Disp: , Rfl:    nitroGLYCERIN  (NITROSTAT ) 0.4 MG SL tablet, Place 0.4 mg under the tongue every 5 (five) minutes as needed for chest pain., Disp: , Rfl:    OLANZapine  (ZYPREXA ) 2.5 MG tablet, Take 1 tablet by mouth at bedtime., Disp: , Rfl:    omeprazole (PRILOSEC) 40 MG capsule, Take 40 mg by mouth daily., Disp: , Rfl:    polyethylene glycol powder (MIRALAX ) 17 GM/SCOOP powder, Take 17 g by mouth daily., Disp: , Rfl:    RYBELSUS 7 MG TABS, Take 1 tablet by mouth every morning., Disp: , Rfl:    senna-docusate (SENOKOT-S) 8.6-50 MG tablet, Take 2 tablets by mouth daily., Disp: , Rfl:    Sennosides 8.6 MG CAPS, Take by mouth., Disp: , Rfl:    sodium bicarbonate  650 MG tablet, Take 1 tablet by mouth 3 (three) times daily., Disp: , Rfl:   Labs and Diagnostic Imaging   CBC:  Recent Labs  Lab 08/22/24 2314  WBC 8.7  NEUTROABS 7.3  HGB 8.8*  9.5*  HCT 27.9*  28.0*  MCV 92.1  PLT 159    Basic Metabolic Panel:  Lab Results  Component Value Date   NA 136 08/22/2024   NA 133 (L) 08/22/2024   K 3.3 (L) 08/22/2024   K 3.2 (L) 08/22/2024   CO2 29 08/22/2024   GLUCOSE 233 (H) 08/22/2024   GLUCOSE 239 (H) 08/22/2024   BUN 34 (H) 08/22/2024   BUN 28 (H) 08/22/2024   CREATININE 2.60 (H) 08/22/2024   CREATININE 2.61 (H) 08/22/2024   CALCIUM  7.9 (L) 08/22/2024   GFRNONAA 25 (L) 08/22/2024   GFRAA 48 (L) 07/03/2020   Lipid Panel: No results found for: LDLCALC HgbA1c: No results found for: HGBA1C Urine Drug Screen: No results found for: LABOPIA, COCAINSCRNUR, LABBENZ, AMPHETMU, THCU, LABBARB  Alcohol Level     Component Value Date/Time    ETH <15 08/22/2024 2314   INR  Lab Results  Component Value Date   INR 1.2 08/22/2024   APTT  Lab Results  Component Value Date   APTT 28 08/22/2024   Thiamine  pending B12 pending Folate pending TSH pending RPR pending Homocystine pending  CT Head without contrast(Personally reviewed): No acute abnormality, atrophy with chronic small vessel ischemic disease and remote left MCA territory infarct  MRI Brain(Personally reviewed): No acute abnormality, chronic small vessel ischemic disease, left MCA territory chronic infarct and scattered microhemorrhages  rEEG:  - Continuous slow, left centro- parietal  region This study is suggestive of cortical dysfunction arising from left centro- parietal region likely secondary to underlying structural abnormality. No seizures or definite epileptiform discharges were seen throughout the recording.  Continuous EEG: Pending  Assessment   Robert Spears is a 73 y.o. male with history of hypertension, diabetes, hyperlipidemia, CAD status post CABG, left occipital IPH in 2021, CAA, left parietal lobe infarct in 2021, A-fib status post Watchman device not on anticoagulation, left corona radiata stroke and end-stage renal disease on dialysis with recent admission for CHF and AKI who presents from his facility after being found unresponsive after a fall.  Patient was found in his rehab facility on the floor by his bed after an unwitnessed fall.  Staff placed him in a chair and then 15 minutes later noted him to be unresponsive.  Patient's family reports that his mental status has not come back to baseline and states that before his hospitalization 6 weeks ago, he was mowing the lawn and was independent with all ADLs.  Patient's family reports that he has never had a seizure, that no other family members have seizures of that he has never had a serious head injury, meningitis or encephalitis.  However, given his left parietal stroke, he is at risk for  seizure activity.  Family also reports that he had a similar episode of unresponsiveness while he was at the hospital in [facility].  Routine EEG demonstrated no seizures, but will place on LTM EEG given continued confusion.  MRI reveals no acute abnormality.  Given muscle wasting seen on exam and reports from family of poor appetite lately, will send nutritional labs and begin empiric thiamine  supplementation.  Recommendations  -LTM EEG, will likely start AEDs after 24 hours of monitoring given likely unwitnessed seizures - Send folate, B12, thiamine , homocystine, TSH and RPR - Begin empiric thiamine  supplementation - Neurology will continue to follow ______________________________________________________________________ Patient seen by NP with MD, MD to edit note as needed.  Signed, Cortney E Everitt Clint Kill, NP [facility]   NEUROHOSPITALIST ADDENDUM Performed a face to face diagnostic evaluation.   I have reviewed the contents of history and physical exam as documented by PA/ARNP/Resident and agree with above documentation.  I have discussed and formulated the above plan as documented. Edits to the note have been made as needed.   Korynn Kenedy, MD [facility] [phone]   If 7pm to 7am, please call on call as listed on AMION.

## 2024-08-23 NOTE — Progress Notes (Addendum)
 Patient has removed multiple EEG leads will reach out to reading Neuro as to re-apply or not.  ;;;;leads will need to be re-hooked.

## 2024-08-23 NOTE — Progress Notes (Signed)
 Per RN, pt is moving to ED 38. Will hook up LTM EEG, once pt is moved.

## 2024-08-23 NOTE — H&P (Signed)
 History and Physical    Patient: Robert Spears DOB: May 24, 1951 DOA: 08/22/2024 DOS: the patient was seen and examined on 08/23/2024 PCP: Ofilia Lamar CROME, MD  Patient coming from: Home  Chief Complaint:  Chief Complaint  Patient presents with   Loss of Consciousness   Fall    Pt comes from nursing facility and was found down after a fall. Staff reports pt was ok and got up immediately following the fall. About 15 mins later the patient was found unresponsive. Upon arrival EMS states pt was not responding to sternal rub ect. During transport pt came to during ride and was responsive but unable to respond to commands and had some right sided weakness. Stroke alert activated   HPI: Robert Spears is a 73 y.o. male with medical history significant of hypertension, hyperlipidemia, atrial fibrillation s/p LAAO with Watchman device in 09/2020, CAD s/p CABG, diabetes mellitus type 2, ICH in 06/2020, cerebral amyloid angiopathy, CVA, hypothyroidism and chronic back pain who presents with unresponsiveness after having a fall.  History is limited from the patient due to his current state.  The following patient's wife notes that he is normally alert and oriented.  But after spending 28 days in the hospital from 7/21 to 8/28 was not his normal self.  It was reported that he was found on the floor beside his bed by staff and placed back in a chair and noted to be unresponsive 15 minutes later.  Noted to have right arm weakness and decreased responsiveness to commands.  Patient was noted to be more alert and route with EMS but remained confused.  In the ED patient was seen as a code stroke and evaluated by neurology.  CT scan of the head did not note any acute abnormality with atrophy, chronic small vessel disease, and large remote left MCA territory infarct.  Noted to be afebrile with pulse  58-100, respiration 13-29, blood pressure was elevated up to 172/70, and O2 saturation currently  maintained on room air.  Labs significant for hemoglobin 8.8, sodium 133, potassium 3.2, chloride 93, BUN 28, creatinine 2.61, glucose 239, alcohol level undetectable, lactic acid 0.8, and CK 29.    Review of Systems: unable to review all systems due to the inability of the patient to answer questions. Past Medical History:  Diagnosis Date   A-fib [facility])    Acute laryngotracheitis 11/05/2020   Formatting of this note might be different from the original. 11/05/2020   Allergy to bee sting    Benign hypertension with CKD (chronic kidney disease) stage III (HCC) 03/22/2019   Cerebral amyloid angiopathy (HCC) 01/22/2016   Formatting of this note might be different from the original. 2016: RIGHT ARM NUMB, RIGHT FACE WEAK, EXP DYSPHASIA; MOD CAROTID PLAQUE, AFIB 2018: residual numbness fingers right hand 2021: left occ-temp hemorrhage on NOAC with ex/rec dysphasia, lower facial droop, LUE weakness   Chronic bilateral low back pain without sciatica 08/01/2016   Formatting of this note might be different from the original. 2018: surg   Coronary artery disease 01/21/2016   Formatting of this note might be different from the original. Managed CARDS (1997) onset (2005) PCI of RCA, PCI with DES of RCA  post CABS  Formatting of this note might be different from the original. Managed CARDS (1997) onset (2005) PCI of RCA, PCI with DES of RCA  post CABG   Coronary artery disease involving native coronary artery of native heart without angina pectoris 08/09/2015   DDD (degenerative  disc disease), lumbar 08/24/2017   Formatting of this note might be different from the original. 2018: surgery   Diabetes mellitus    Dyslipidemia 08/09/2015   Family history of premature coronary artery disease 09/17/2018   Formatting of this note might be different from the original. Brother   Hemiparesis of right dominant side (HCC) 07/24/2020   Formatting of this note might be different from the original. 2021: left occ-temp hemorrhage    High blood pressure    High cholesterol    Hypertension 10/02/2015   Intracranial bleeding (HCC) 06/28/2020   Intraparenchymal hemorrhage of brain (HCC) 06/23/2020   Low vitamin B12 level 07/24/2020   Formatting of this note might be different from the original. 2021: 236   Mixed hyperlipidemia 01/21/2016   Formatting of this note might be different from the original. 01/2015 Not at goal LDL 79 HDL 26 (LDL 70 HDL 40)   Obesity (BMI 30-39.9) 11/01/2020   Permanent atrial fibrillation (HCC) 01/22/2016   Formatting of this note might be different from the original. 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   Swelling of both lower extremities 06/28/2020   TIA (transient ischemic attack) 08/21/2015   Type 2 diabetes mellitus with stage 3b chronic kidney disease, without long-term current use of insulin  (HCC) 01/21/2016   Overview:  04/2015 A1C has increased to 7.6   Uncontrolled hypertension 02/07/2021   Weight loss    Wellness examination 03/09/2020   Past Surgical History:  Procedure Laterality Date   ARTERIAL BYPASS SURGRY     CARDIAC SURGERY     CORONARY ARTERY BYPASS GRAFT     CORONARY STENT PLACEMENT     LEFT ATRIAL APPENDAGE OCCLUSION  10/09/2020   Social History:  reports that he quit smoking about 28 years ago. His smoking use included cigarettes. He has quit using smokeless tobacco. He reports that he does not drink alcohol and does not use drugs.  Allergies  Allergen Reactions   Bee Venom     Family History  Problem Relation Age of Onset   Cancer Sister        Bone   Diabetes Mother    High blood pressure Mother    Heart disease Mother 45   Heart attack Brother    Diabetes Brother    High blood pressure Brother    Heart disease Brother    Other Father 86       MVA   Heart  disease Sister    Diabetes Sister     Prior to Admission medications   Medication Sig Start Date End Date Taking? Authorizing Provider  acetaminophen  (TYLENOL ) 500 MG tablet Take 500 mg by mouth every 6 (six) hours as needed for mild pain (pain score 1-3), headache or fever.   Yes [provider]  Amino Acids-Protein Hydrolys (PRO-STAT) LIQD Take 30 mLs by mouth in the morning and at bedtime.   Yes [provider]  amLODipine  (NORVASC ) 2.5 MG tablet Take 1 tablet by mouth. 08/11/24  Yes [provider]  aspirin  325 MG tablet Take 325 mg by mouth daily.   Yes [provider]  atorvastatin  (LIPITOR) 20 MG tablet Take 20 mg by mouth daily at 6 PM. 01/24/16  Yes [provider]  carvedilol  (COREG ) 25 MG tablet Take 1 tablet (  25 mg total) by mouth 2 (two) times daily. 11/05/23 01/14/25 Yes Krasowski, Robert J, MD  EPINEPHrine  0.3 mg/0.3 mL IJ SOAJ injection Inject 0.3 mg into the muscle as needed for anaphylaxis.   Yes [provider]  ergocalciferol (VITAMIN D2) 1.25 MG (50000 UT) capsule Take 1 capsule by mouth once a week. Take every Tuesday 02/07/21  Yes [provider]  ezetimibe  (ZETIA ) 10 MG tablet Take 1 tablet by mouth once daily 06/22/24  Yes Krasowski, Robert J, MD  ferrous sulfate  325 (65 FE) MG tablet Take 325 mg by mouth in the morning and at bedtime.   Yes [provider]  hydrALAZINE  (APRESOLINE ) 25 MG tablet Take 25 mg by mouth 3 (three) times daily.   Yes [provider]  insulin  glargine (LANTUS ) 100 UNIT/ML injection Inject 8 Units into the skin at bedtime.   Yes [provider]  levothyroxine  (SYNTHROID ) 50 MCG tablet Take 50 mcg by mouth daily before breakfast.   Yes [provider]  Multiple Vitamin (MULTI-VITAMINS) TABS Take 1 tablet by mouth daily. Unknown strehgth   Yes [provider]  nitroGLYCERIN  (NITROSTAT ) 0.4 MG SL tablet Place 0.4 mg under the tongue every 5 (five)  minutes as needed for chest pain.   Yes [provider]  OLANZapine  (ZYPREXA ) 2.5 MG tablet Take 1 tablet by mouth at bedtime. 08/11/24  Yes [provider]  omeprazole (PRILOSEC) 40 MG capsule Take 40 mg by mouth daily. 08/11/24  Yes [provider]  polyethylene glycol powder (MIRALAX ) 17 GM/SCOOP powder Take 17 g by mouth daily. 08/11/24 08/25/24 Yes [provider]  RYBELSUS 7 MG TABS Take 1 tablet by mouth every morning.   Yes [provider]  senna-docusate (SENOKOT-S) 8.6-50 MG tablet Take 2 tablets by mouth daily. 08/12/24 09/11/24 Yes [provider]  Sennosides 8.6 MG CAPS Take by mouth. 08/11/24  Yes [provider]  sodium bicarbonate  650 MG tablet Take 1 tablet by mouth 3 (three) times daily. 08/11/24  Yes [provider]    Physical Exam: Vitals:   08/23/24 0530 08/23/24 0630 08/23/24 0645 08/23/24 0715  BP:   (!) 172/70 (!) 158/77  Pulse: 100 76 (!) 47 78  Resp: (!) 21 13 17 14   Temp:    98.9 F (37.2 C)  TempSrc:      SpO2: 99% 100% 100% 100%  Weight:      Height:        Constitutional: Elderly male who appears to be lethargic and cannot follow commands Eyes: PERRL, lids and conjunctivae normal ENMT: Mucous membranes are dry. Posterior pharynx clear of any exudate or lesions.Normal dentition.  Neck: normal, supple  Respiratory: clear to auscultation bilaterally, no wheezing, no crackles. Normal respiratory effort. No accessory muscle use.  Cardiovascular: Regular rate and rhythm, no murmurs / rubs / gallops.   2+ pedal pulses. No carotid bruits.  Abdomen: no tenderness, no masses palpated.  Bowel sounds positive.  Musculoskeletal: no clubbing / cyanosis. No joint deformity upper and lower extremities. Good ROM, no contractures. Normal muscle tone.  Skin: no rashes, lesions, ulcers. No induration Neurologic: CN 2-12 grossly intact.  Weakness on the right side strength 5/5 in all 4.  Psychiatric: Normal  judgment and insight.  Oriented to person but not to place and situation normal mood.   Data Reviewed:  Reviewed labs, imaging, and pertinent records as documented.  Assessment and Plan:  Fall with subsequent episode of unresponsiveness Acute encephalopathy Patient was a code  stroke after he became unresponsive 15 minutes after fall.  Patient noted to have right sided weakness in conjunction with poor responsiveness.  Patient still seems to be confused but alert and oriented least to person.  Question possibility of postconcussive syndrome versus possibility of seizure - Admit to telemetry bed - Neurochecks - Delirium precautions - Check MRI brain - Check EEG - Neurology consulted, will follow-up for any further recommendations  Uncontrolled diabetes mellitus type 2 with hyperglycemia Glucose noted to be elevated up to 239. - Hypoglycemia protocol - Check Hemoglobin A1c ( 6 ) - Continue long-acting insulin  - CBGs before every meal with sensitive SSI - adjust insulin  regimen as deemed medically appropriate   ESRD on HD Creatinine noted to be elevated up to 2.61 with BUN 28.  Patient had been started on hemodialysis.  Patient was previously requiring dialysis Monday, Wednesday, Friday. - Check urinalysis - Nephrology consulted, for possible need of dialysis during hospital stay  Heart failure with preserved ejection fraction Stable.  Last echocardiogram noted EF to be 55 to 60% with indeterminate diastolic parameters when checked 07/15/2024. - Fluid management with HD  Hyponatremia Acute.  Sodium noted to be 133.  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 - Continue to monitor sodium levels  Hypokalemia Acute.  Potassium noted to be 3.2. - Give 20 mEq of potassium chloride  p.o. - Continue to monitor and replace as needed  Permanent atrial fibrillation  Currently rate controlled. S/p Watchman device -Goal potassium at least 4 and magnesium least 2 -  Continue Coreg  and full dose aspirin   Anemia of chronic disease Hemoglobin 8.4. - Continue to monitor  CAD s/p CABG - Continue aspirin , statin, beta-blocker  Hypothyroidism - Continue levothyroxine   DVT prophylaxis: Heparin  Advance Care Planning:   Code Status: Full Code    Consults: Neurology and nephrology  Family Communication: Family updated over the phone  Severity of Illness: The appropriate patient status for this patient is INPATIENT. Inpatient status is judged to be reasonable and necessary in order to provide the required intensity of service to ensure the patient's safety. The patient's presenting symptoms, physical exam findings, and initial radiographic and laboratory data in the context of their chronic comorbidities is felt to place them at high risk for further clinical deterioration. Furthermore, it is not anticipated that the patient will be medically stable for discharge from the hospital within 2 midnights of admission.   * I certify that at the point of admission it is my clinical judgment that the patient will require inpatient hospital care spanning beyond 2 midnights from the point of admission due to high intensity of service, high risk for further deterioration and high frequency of surveillance required.*  Author: Maximino DELENA Sharps, MD 08/23/2024 9:00 AM  For on call review [email or web address].

## 2024-08-24 ENCOUNTER — Inpatient Hospital Stay (HOSPITAL_COMMUNITY)
Admit: 2024-08-24 | Discharge: 2024-08-24 | Disposition: A | Discharge status: Home / Self Care | Attending: Nurse Practitioner

## 2024-08-24 ENCOUNTER — Inpatient Hospital Stay (HOSPITAL_COMMUNITY)

## 2024-08-24 DIAGNOSIS — G9340 Encephalopathy, unspecified: Secondary | ICD-10-CM | POA: Diagnosis not present

## 2024-08-24 DIAGNOSIS — I509 Heart failure, unspecified: Secondary | ICD-10-CM

## 2024-08-24 DIAGNOSIS — I132 Hypertensive heart and chronic kidney disease with heart failure and with stage 5 chronic kidney disease, or end stage renal disease: Secondary | ICD-10-CM

## 2024-08-24 DIAGNOSIS — R769 Abnormal immunological finding in serum, unspecified: Secondary | ICD-10-CM

## 2024-08-24 DIAGNOSIS — R569 Unspecified convulsions: Secondary | ICD-10-CM | POA: Diagnosis not present

## 2024-08-24 LAB — URINALYSIS, ROUTINE W REFLEX MICROSCOPIC
Bilirubin Urine: NEGATIVE
Glucose, UA: 150 mg/dL — AB
Hgb urine dipstick: NEGATIVE
Ketones, ur: NEGATIVE mg/dL
Leukocytes,Ua: NEGATIVE
Nitrite: NEGATIVE
Protein, ur: >=300 mg/dL — AB
Specific Gravity, Urine: 1.024 (ref 1.005–1.030)
pH: 5 (ref 5.0–8.0)

## 2024-08-24 LAB — GLUCOSE, CAPILLARY
Glucose-Capillary: 109 mg/dL — ABNORMAL HIGH (ref 70–99)
Glucose-Capillary: 76 mg/dL (ref 70–99)
Glucose-Capillary: 67 mg/dL — ABNORMAL LOW (ref 70–99)
Glucose-Capillary: 88 mg/dL (ref 70–99)
Glucose-Capillary: 84 mg/dL (ref 70–99)

## 2024-08-24 LAB — BASIC METABOLIC PANEL WITH GFR
Anion gap: 9 (ref 5–15)
BUN: 45 mg/dL — ABNORMAL HIGH (ref 8–23)
CO2: 32 mmol/L (ref 22–32)
Calcium: 8.2 mg/dL — ABNORMAL LOW (ref 8.9–10.3)
Chloride: 94 mmol/L — ABNORMAL LOW (ref 98–111)
Creatinine, Ser: 3.53 mg/dL — ABNORMAL HIGH (ref 0.61–1.24)
GFR, Estimated: 17 mL/min — ABNORMAL LOW (ref >60)
Glucose, Bld: 91 mg/dL (ref 70–99)
Potassium: 3.7 mmol/L (ref 3.5–5.1)
Sodium: 135 mmol/L (ref 135–145)

## 2024-08-24 LAB — COMPREHENSIVE METABOLIC PANEL WITH GFR
ALT: 11 U/L (ref 0–44)
AST: <10 U/L — ABNORMAL LOW (ref 15–41)
Albumin: 2.6 g/dL — ABNORMAL LOW (ref 3.5–5.0)
Alkaline Phosphatase: 116 U/L (ref 38–126)
Anion gap: 13 (ref 5–15)
BUN: 37 mg/dL — ABNORMAL HIGH (ref 8–23)
CO2: 28 mmol/L (ref 22–32)
Calcium: 8 mg/dL — ABNORMAL LOW (ref 8.9–10.3)
Chloride: 94 mmol/L — ABNORMAL LOW (ref 98–111)
Creatinine, Ser: 3.11 mg/dL — ABNORMAL HIGH (ref 0.61–1.24)
GFR, Estimated: 20 mL/min — ABNORMAL LOW (ref >60)
Glucose, Bld: 142 mg/dL — ABNORMAL HIGH (ref 70–99)
Potassium: 3.1 mmol/L — ABNORMAL LOW (ref 3.5–5.1)
Sodium: 135 mmol/L (ref 135–145)
Total Bilirubin: 1.2 mg/dL (ref 0.0–1.2)
Total Protein: 5.9 g/dL — ABNORMAL LOW (ref 6.5–8.1)

## 2024-08-24 LAB — CBC
HCT: 28.2 % — ABNORMAL LOW (ref 39.0–52.0)
Hemoglobin: 9 g/dL — ABNORMAL LOW (ref 13.0–17.0)
MCH: 29 pg (ref 26.0–34.0)
MCHC: 31.9 g/dL (ref 30.0–36.0)
MCV: 91 fL (ref 80.0–100.0)
Platelets: 161 K/uL (ref 150–400)
RBC: 3.1 MIL/uL — ABNORMAL LOW (ref 4.22–5.81)
RDW: 17.6 % — ABNORMAL HIGH (ref 11.5–15.5)
WBC: 8.8 K/uL (ref 4.0–10.5)
nRBC: 0 % (ref 0.0–0.2)

## 2024-08-24 LAB — AMMONIA: Ammonia: 20 umol/L (ref 9–35)

## 2024-08-24 LAB — RPR
RPR Ser Ql: REACTIVE — AB
RPR Titer: 1:1 {titer}

## 2024-08-24 LAB — MAGNESIUM
Magnesium: 1.8 mg/dL (ref 1.7–2.4)
Magnesium: 2 mg/dL (ref 1.7–2.4)

## 2024-08-24 MED ORDER — POTASSIUM CHLORIDE 10 MEQ/100ML IV SOLN
10.0000 meq | INTRAVENOUS | Status: AC
  Administered 2024-08-24 (×4): 10 meq via INTRAVENOUS
  Filled 2024-08-24 (×3): qty 100

## 2024-08-24 MED ORDER — SODIUM CHLORIDE 0.9% FLUSH: 10.0000 mL | INTRAVENOUS | Status: DC | PRN

## 2024-08-24 MED ORDER — METOPROLOL TARTRATE 5 MG/5ML IV SOLN
5.0000 mg | INTRAVENOUS | Status: DC | PRN
Start: 2024-08-24 — End: 2024-09-02

## 2024-08-24 MED ORDER — CHLORHEXIDINE GLUCONATE CLOTH 2 % EX PADS
6.0000 | MEDICATED_PAD | Freq: Every day | CUTANEOUS | Status: DC
Start: 2024-08-24 — End: 2024-08-25
  Administered 2024-08-24: 6 via TOPICAL

## 2024-08-24 MED ORDER — IPRATROPIUM-ALBUTEROL 0.5-2.5 (3) MG/3ML IN SOLN: 3.0000 mL | RESPIRATORY_TRACT | Status: DC | PRN

## 2024-08-24 MED ORDER — GUAIFENESIN 100 MG/5ML PO LIQD: 5.0000 mL | ORAL | Status: DC | PRN

## 2024-08-24 MED ORDER — SODIUM CHLORIDE 0.9% FLUSH
10.0000 mL | Freq: Two times a day (BID) | INTRAVENOUS | Status: DC
  Administered 2024-08-24 – 2024-09-01 (×12): 10 mL

## 2024-08-24 MED ORDER — THIAMINE HCL 100 MG/ML IJ SOLN
500.0000 mg | INTRAVENOUS | Status: AC
  Administered 2024-08-24 – 2024-08-28 (×3): 500 mg via INTRAVENOUS
  Filled 2024-08-24 (×5): qty 5

## 2024-08-24 MED ORDER — HYDRALAZINE HCL 20 MG/ML IJ SOLN
10.0000 mg | INTRAMUSCULAR | Status: DC | PRN
Start: 2024-08-24 — End: 2024-09-02

## 2024-08-24 MED ORDER — GLUCAGON HCL RDNA (DIAGNOSTIC) 1 MG IJ SOLR: 1.0000 mg | INTRAMUSCULAR | Status: DC | PRN

## 2024-08-24 MED ORDER — SENNOSIDES-DOCUSATE SODIUM 8.6-50 MG PO TABS
1.0000 | ORAL_TABLET | Freq: Every evening | ORAL | Status: DC | PRN
  Filled 2024-08-24: qty 1

## 2024-08-24 NOTE — Progress Notes (Signed)
 PROGRESS NOTE    Robert Spears  FMW:[id number] DOB: 02/01/1951 DOA: 08/22/2024 PCP: Ofilia Lamar CROME, MD    Brief Narrative:  73 year old with history of HTN, HLD, A-fib status post LAAO with watchman's device in 2021, CAD status post CABG, DM2, ICH 2021, cerebral amyloid angiopathy, CVA, hypothyroidism, chronic back pain comes to the hospital for being unresponsive for several minutes followed by right arm weakness.  Initially code stroke was called and neurology was consulted.  CT of the head showed large left-sided remote MCA territory CVA, MRI brain negative and EEG showed slow cortical dysfunction of the left central parietal region.   Assessment & Plan:   Fall with subsequent episode of unresponsiveness Acute encephalopathy Concerns of possible seizure activity.Initially code stroke was called and neurology was consulted.  CT of the head showed large left-sided remote MCA territory CVA, MRI brain negative and EEG showed slow cortical dysfunction of the left central parietal region.  Currently patient is getting continuous EEG -Folate, B12, TSH are normal Check Ammonia; UA Will order thiamine  500mg  x 5 days DC Zyprexa  for now.   RPR, reactive -Will order Treponemal Ab test.    Uncontrolled diabetes mellitus type 2 with hyperglycemia Uncontrolled, A1c 6.0.  Continue sliding scale and Accu-Cheks   ESRD on HD Nephrology following.   Heart failure with preserved ejection fraction, EF 55% Stable.  Recent outpatient echo on 8/1 shows EF of 55%    Hypokalemia As needed repletion   Permanent atrial fibrillation  Currently rate controlled. S/p Watchman device On Coreg  and aspirin    Anemia of chronic disease Hemoglobin 8.4. - Continue to monitor   CAD s/p CABG - Continue aspirin , statin, beta-blocker   Hypothyroidism - Continue levothyroxine    DVT prophylaxis: Heparin  Advance Care Planning:   Code Status: Full Code     Consults: Neurology and nephrology   Status  is: Inpatient Remains inpatient appropriate because: on going eval for weakness.    PT Follow up Recs:   Subjective:  Sluggish in his response, but answers basic questions and follows simple commands.  Florence Macintosh, no answer.   Examination:  General exam: Appears calm and comfortable ; sluggish Respiratory system: Clear to auscultation. Respiratory effort normal. Cardiovascular system: S1 & S2 heard, RRR. No JVD, murmurs, rubs, gallops or clicks. No pedal edema. Gastrointestinal system: Abdomen is nondistended, soft and nontender. No organomegaly or masses felt. Normal bowel sounds heard. Central nervous system: Alert and oriented. No focal neurological deficits. Extremities: Symmetric 4 x 5 power. Skin: No rashes, lesions or ulcers Psychiatry: Judgement and insight appear poor                Diet Orders (From admission, onward)     Start     Ordered   08/23/24 0934  Diet heart healthy/carb modified Room service appropriate? Yes; Fluid consistency: Thin  Diet effective now       Question Answer Comment  Diet-HS Snack? Nothing   Room service appropriate? Yes   Fluid consistency: Thin      08/23/24 0947            Objective: Vitals:   08/23/24 1936 08/24/24 0619 08/24/24 0815 08/24/24 1000  BP: (!) 160/77 (!) 172/83 (!) 175/88 (!) 175/88  Pulse: 81 67 81   Resp: 16 20 18    Temp: 97.9 F (36.6 C) (!) 97.4 F (36.3 C) 97.6 F (36.4 C)   TempSrc:  Oral Oral   SpO2: 98% 100% 99%   Weight:  Height:        Intake/Output Summary (Last 24 hours) at 08/24/2024 1258 Last data filed at 08/24/2024 0600 Gross per 24 hour  Intake 240 ml  Output 150 ml  Net 90 ml   Filed Weights   08/22/24 2300 08/22/24 2350  Weight: 95 kg 95 kg    Scheduled Meds:  (feeding supplement) PROSource Plus  30 mL Oral BID   amLODipine   2.5 mg Oral Daily   aspirin   325 mg Oral Daily   atorvastatin   20 mg Oral q1800   carvedilol   25 mg Oral BID   Chlorhexidine   Gluconate Cloth  6 each Topical Daily   ezetimibe   10 mg Oral Daily   ferrous sulfate   325 mg Oral Q breakfast   heparin   5,000 Units Subcutaneous Q8H   hydrALAZINE   25 mg Oral TID   Influenza vac split trivalent PF  0.5 mL Intramuscular Tomorrow-1000   insulin  aspart  0-6 Units Subcutaneous TID WC   insulin  glargine  8 Units Subcutaneous QHS   levothyroxine   50 mcg Oral QAC breakfast   multivitamin with minerals  1 tablet Oral Daily   polyethylene glycol  17 g Oral Daily   senna-docusate  2 tablet Oral Daily   sodium bicarbonate   650 mg Oral TID   sodium chloride  flush  10-40 mL Intracatheter Q12H   sodium chloride  flush  3 mL Intravenous Q12H   Continuous Infusions:  potassium chloride  10 mEq (08/24/24 1236)   thiamine  (VITAMIN B1) injection      Nutritional status     Body mass index is 27.63 kg/m.  Data Reviewed:   CBC: Recent Labs  Lab 08/22/24 2314 08/24/24 0230  WBC 8.7 8.8  NEUTROABS 7.3  --   HGB 8.8*  9.5* 9.0*  HCT 27.9*  28.0* 28.2*  MCV 92.1 91.0  PLT 159 161   Basic Metabolic Panel: Recent Labs  Lab 08/22/24 2314 08/24/24 0230  NA 133*  136 135  K 3.2*  3.3* 3.1*  CL 93*  92* 94*  CO2 29 28  GLUCOSE 239*  233* 142*  BUN 28*  34* 37*  CREATININE 2.61*  2.60* 3.11*  CALCIUM  7.9* 8.0*  MG  --  1.8   GFR: Estimated Creatinine Clearance: 23.6 mL/min (A) (by C-G formula based on SCr of 3.11 mg/dL (H)). Liver Function Tests: Recent Labs  Lab 08/22/24 2314 08/24/24 0230  AST 17 <10*  ALT 14 11  ALKPHOS 132* 116  BILITOT 1.4* 1.2  PROT 6.3* 5.9*  ALBUMIN 2.8* 2.6*   No results for input(s): LIPASE, AMYLASE in the last 168 hours. No results for input(s): AMMONIA in the last 168 hours. Coagulation Profile: Recent Labs  Lab 08/22/24 2314  INR 1.2   Cardiac Enzymes: Recent Labs  Lab 08/22/24 2314  CKTOTAL 29*   BNP (last 3 results) No results for input(s): PROBNP in the last 8760 hours. HbA1C: Recent Labs     08/23/24 1503  HGBA1C 6.0*   CBG: Recent Labs  Lab 08/23/24 1231 08/23/24 1646 08/23/24 1934 08/24/24 0747 08/24/24 1158  GLUCAP 132* 99 165* 109* 76   Lipid Profile: No results for input(s): CHOL, HDL, LDLCALC, TRIG, CHOLHDL, LDLDIRECT in the last 72 hours. Thyroid  Function Tests: Recent Labs    08/23/24 0218  TSH 3.445   Anemia Panel: Recent Labs    08/23/24 0218 08/23/24 1044  VITAMINB12  --  758  FOLATE 11.0  --    Sepsis Labs: Recent Labs  Lab 08/22/24 2320  LATICACIDVEN 0.8    No results found for this or any previous visit (from the past 240 hours).       Radiology Studies: Overnight EEG with video Result Date: 08/24/2024 Shelton Arlin KIDD, MD     08/24/2024 10:37 AM Patient Name: Logan Baltimore MRN: [id number] Epilepsy Attending: Arlin KIDD Shelton Referring Physician/Provider:de Clint Abbey Earle FORBES, NP Duration: 08/23/2024 1152 to 08/24/2024 1030  Patient history: 73yo M with an episode of unresponsiveness 15 minutes after a fall. EEG to evaluate for seizure  Level of alertness: Awake, asleep  AEDs during EEG study: None  Technical aspects: This EEG study was done with scalp electrodes positioned according to the 10-20 International system of electrode placement. Electrical activity was reviewed with band pass filter of 1-70Hz , sensitivity of 7 uV/mm, display speed of 41mm/sec with a 60Hz  notched filter applied as appropriate. EEG data were recorded continuously and digitally stored.  Video monitoring was available and reviewed as appropriate.  Description: The posterior dominant rhythm consists of 8 Hz activity of moderate voltage (25-35 uV) seen predominantly in posterior head regions, symmetric and reactive to eye opening and eye closing. Sleep was characterized by vertex waves, sleep spindles (12 to 14 Hz), maximal frontocentral region. EEG showed continuous 3-5hz  theta- delta slowing in left centro-parietal region which at times appeared high  amplitude and sharply contoured lasting for about 1-2 minutes without definite evolution.  Intermittent generalized 3 to 6 Hz theta- delta slowing was also noted hyperventilation and photic stimulation were not performed.   EEG was not interpretable between 08/23/2024 2254 to 08/24/2024 0503 due to electrode artifact  ABNORMALITY - Continuous slow, left centro- parietal region - Intermittent slow, generalized  IMPRESSION: This study is suggestive of cortical dysfunction arising from left centro- parietal region likely secondary to underlying structural abnormality.  Additionally there is mild diffuse encephalopathy. No seizures or definite epileptiform discharges were seen throughout the recording.  Arlin KIDD Shelton    MR BRAIN WO CONTRAST Result Date: 08/23/2024 CLINICAL DATA:  73 year old male code stroke presentation yesterday. EXAM: MRI HEAD WITHOUT CONTRAST TECHNIQUE: Multiplanar, multiecho pulse sequences of the brain and surrounding structures were obtained without intravenous contrast. COMPARISON:  Head CT yesterday. Brain MRI 08/09/2024 [facility] FINDINGS: Brain: No restricted diffusion or evidence of acute infarction. Chronic encephalomalacia in the posterior left hemisphere affecting posterior left MCA and left MCA/PCA watershed territory. Ex vacuo ventricular enlargement. Fairly widely scattered chronic microhemorrhages in addition to hemosiderin within the area of cortical encephalomalacia. And occasional other small areas of superficial siderosis (anterior frontal lobes series 7, image 65). Patchy and confluent bilateral cerebral white matter, pontine T2 and FLAIR hyperintensity. Occasional small chronic cerebellar infarcts. Deep gray nuclei relatively spared. No midline shift, mass effect, evidence of mass lesion, ventriculomegaly, extra-axial collection or acute intracranial hemorrhage. Cervicomedullary junction and pituitary are within normal limits.  Vascular: Major intracranial vascular flow voids are stable from last month. Skull and upper cervical spine: Negative. Sinuses/Orbits: Stable and negative orbits. Mild paranasal sinus mucosal thickening and small retention cysts are stable. Other: Mastoids remain well aerated. Grossly negative visible internal auditory structures. Negative visible scalp and face. IMPRESSION: 1. No acute intracranial abnormality. 2. Stable since last month chronic ischemic disease, most pronounced in the posterior left MCA territory. Hemosiderin there, but also a number of additional scattered chronic microhemorrhages and mild frontal lobe superficial siderosis. Amyloid Angiopathy is not excluded. Electronically Signed   By: VEAR  Shona M.D.   On: 08/23/2024 10:20   EEG adult Result Date: 08/23/2024 Shelton Arlin KIDD, MD     08/23/2024  9:09 AM Patient Name: Belle Antolin MRN: [id number] Epilepsy Attending: Arlin KIDD Shelton Referring Physician/Provider: Merrianne Locus, MD Date: 08/23/2024 Duration: 23.22 mins Patient history: 73yo M with an episode of unresponsiveness 15 minutes after a fall. EEG to evaluate for seizure Level of alertness: Awake, asleep AEDs during EEG study: None Technical aspects: This EEG study was done with scalp electrodes positioned according to the 10-20 International system of electrode placement. Electrical activity was reviewed with band pass filter of 1-70Hz , sensitivity of 7 uV/mm, display speed of 23mm/sec with a 60Hz  notched filter applied as appropriate. EEG data were recorded continuously and digitally stored.  Video monitoring was available and reviewed as appropriate. Description: The posterior dominant rhythm consists of 8 Hz activity of moderate voltage (25-35 uV) seen predominantly in posterior head regions, symmetric and reactive to eye opening and eye closing. Sleep was characterized by vertex waves, sleep spindles (12 to 14 Hz), maximal frontocentral region.  EEG showed continuous 3-5hz  theta-  delta slowing in left centro-parietal region.  Additionally at around 0451 on 08/23/2024, EEG showed sharply contoured high amplitude 3-5 hz theta-delta slowing in left central parietal region which appeared high amplitude and sharply contoured lasting for about 2 minutes without definite evolution. Hyperventilation and photic stimulation were not performed.   ABNORMALITY - Continuous slow, left centro- parietal region IMPRESSION: This study is suggestive of cortical dysfunction arising from left centro- parietal region likely secondary to underlying structural abnormality. No seizures or definite epileptiform discharges were seen throughout the recording. Arlin KIDD Shelton   CT HEAD CODE STROKE WO CONTRAST Result Date: 08/22/2024 CLINICAL DATA:  Code stroke. Initial evaluation for acute neuro deficit, stroke suspected. EXAM: CT HEAD WITHOUT CONTRAST TECHNIQUE: Contiguous axial images were obtained from the base of the skull through the vertex without intravenous contrast. RADIATION DOSE REDUCTION: This exam was performed according to the departmental dose-optimization program which includes automated exposure control, adjustment of the mA and/or kV according to patient size and/or use of iterative reconstruction technique. COMPARISON:  Prior study from 12/25/2018 FINDINGS: Brain: Generalized age-related cerebral atrophy with chronic microvascular ischemic disease. Large remote left MCA distribution infarct involving the left cerebral hemisphere again seen, stable. Remote lacunar infarct at the left frontal corona radiata. No acute intracranial hemorrhage. No acute large vessel territory infarct. No mass lesion or midline shift. Ex vacuo dilatation of the left lower ventricle without hydrocephalus. No extra-axial fluid collection. Vascular: No abnormal hyperdense vessel. Calcified atherosclerosis present at the skull base. Skull: Scalp soft tissues demonstrate no acute finding. Calvarium intact. Sinuses/Orbits:  Globes and orbital soft tissues within normal limits. Small left maxillary sinus retention cyst. Paranasal sinuses mastoid air cells are otherwise clear. Other: None. ASPECTS [facility] Stroke Program Early CT Score) - Ganglionic level infarction (caudate, lentiform nuclei, internal capsule, insula, M1-M3 cortex): 7 - Supraganglionic infarction (M4-M6 cortex): 3 Total score (0-10 with 10 being normal): 10 IMPRESSION: 1. No acute intracranial abnormality. 2. Aspects is 10. 3. 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 These results were communicated to Dr. Lindzen at 11:26 p.m. on 08/22/2024 by text page via the [facility] messaging system. Electronically Signed   By: Morene Hoard M.D.   On: 08/22/2024 23:29           LOS: 1 day   Time spent= 35 mins  Burgess JAYSON Dare, MD [facility]  If 7PM-7AM, please contact night-coverage  08/24/2024, 12:58 PM

## 2024-08-24 NOTE — TOC Initial Note (Signed)
 Transition of Care [facility]) - Initial/Assessment Note    Patient Details  Name: Robert Spears MRN: [id number] Date of Birth: Mar 06, 1951  [facility] CM/SW Contact:    Lendia Dais, LCSWA Phone Number: 08/24/2024, 3:19 PM  Clinical Narrative:  Patient is disoriented x3, wife Avelina is at bedside.  Patient is from [facility] and the plan is for the to return. Pt was there for STR and will resume once medically stable to discharge.   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 Avelina stated that they do not have a HCPOA and declined information regarding a HCPOA. Avelina stated that the SNF provides everything they need. CSW inquired if they have trouble affording any medications and Avelina agreed. CSW stated that they will supply resources in the AVS and that they may or may not be eligible. CSW informed RNCM.  CSW will continue to follow.                 Expected Discharge Plan: Skilled Nursing Facility Barriers to Discharge: Continued Medical Work up   Patient Goals and CMS Choice Patient states their goals for this hospitalization and ongoing recovery are:: Return to [name]   Choice offered to / list presented to : NA      Expected Discharge Plan and Services In-house Referral: Clinical Social Work     Living arrangements for the past 2 months: Skilled Nursing Facility                                      Prior Living Arrangements/Services Living arrangements for the past 2 months: Skilled Nursing Facility Lives with:: Facility Resident   Do you feel safe going back to the place where you live?: Yes      Need for Family Participation in Patient Care: Yes (Comment) Care giver support system in place?: Yes (comment)   Criminal Activity/Legal Involvement Pertinent to Current Situation/Hospitalization: No - Comment as needed  Activities of Daily Living   ADL Screening (condition at time of  admission) Independently performs ADLs?: No Does the patient have a NEW difficulty with bathing/dressing/toileting/self-feeding that is expected to last >3 days?: No Does the patient have a NEW difficulty with getting in/out of bed, walking, or climbing stairs that is expected to last >3 days?: No Does the patient have a NEW difficulty with communication that is expected to last >3 days?: No Is the patient deaf or have difficulty hearing?: No Does the patient have difficulty seeing, even when wearing glasses/contacts?: No Does the patient have difficulty concentrating, remembering, or making decisions?: Yes  Permission Sought/Granted Permission sought to share information with : Family Supports Permission granted to share information with : No (Patient disoriented x3, wife at bedside)  Share Information with NAME: Reyaansh Merlo  Permission granted to share info w AGENCY: [name]  Permission granted to share info w Relationship: Spouse  Permission granted to share info w Contact Information: [phone]  Emotional Assessment Appearance:: Appears stated age Attitude/Demeanor/Rapport: Unresponsive Affect (typically observed): Unable to Assess Orientation: :  (Disoriented x3) Alcohol / Substance Use: Not Applicable Psych Involvement: No (comment)  Admission diagnosis:  Hypokalemia [E87.6] Anemia associated with chronic renal failure [N18.9, D63.1] End-stage renal disease on hemodialysis (HCC) [N18.6, Z99.2] Acute encephalopathy [G93.40] Confusion and disorientation [R41.0] Patient Active Problem List   Diagnosis Date Noted   Acute encephalopathy 08/23/2024   Fall  08/23/2024   ESRD on dialysis (HCC) 08/23/2024   Heart failure with preserved ejection fraction (HCC) 08/23/2024   Hyponatremia 08/23/2024   Hypokalemia 08/23/2024   Anemia due to chronic kidney disease 08/23/2024   Situational anxiety 08/21/2023   Pulmonary hypertension, unspecified (HCC) 05/31/2021   Congestive heart  failure (HCC) 04/29/2021   Weight loss    High cholesterol    A-fib (HCC)    Allergy to bee sting    Volume overload state of heart 02/28/2021   Uncontrolled hypertension 02/07/2021   Acute laryngotracheitis 11/05/2020   Obesity (BMI 30-39.9) 11/01/2020   Presence of Watchman left atrial appendage closure device 10/06/2020   Hypothyroidism 07/30/2020   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) 07/24/2020   Hemiparesis of right dominant side (HCC) 07/24/2020   Low vitamin B12 level 07/24/2020   TSH elevation 07/24/2020   Swelling of both lower extremities 06/28/2020   Intracranial bleeding (HCC) 06/28/2020   Intraparenchymal hemorrhage of brain (HCC) 06/23/2020   Status post coronary artery bypass graft 05/11/2020   Wellness examination 03/09/2020   Benign hypertension with CKD (chronic kidney disease) stage III (HCC) 03/22/2019   Family history of premature coronary artery disease 09/17/2018   DDD (degenerative disc disease), lumbar 08/24/2017   Chronic bilateral low back pain without sciatica 08/01/2016   Permanent atrial fibrillation (HCC) 01/22/2016   Cerebral amyloid angiopathy (HCC) 01/22/2016   Uncontrolled type 2 diabetes mellitus with hyperglycemia, without long-term current use of insulin  (HCC) 01/21/2016   Mixed hyperlipidemia 01/21/2016   Coronary artery disease 01/21/2016   Hypertension 10/02/2015   TIA (transient ischemic attack) 08/21/2015   Coronary artery disease involving native coronary artery of native heart without angina pectoris 08/09/2015   Dyslipidemia 08/09/2015   PCP:  Ofilia Lamar CROME, MD Pharmacy:   [facility] [address], [place] - [address] [facility] [place] [address] Phone: [phone] Fax: [phone]  [facility] [place], [place] - [address] [place] [address] Phone: [phone] Fax: [phone]     Social Drivers of Health  (SDOH) Social History: SDOH Screenings   Food Insecurity: Low Risk  (07/15/2024)   Received from [facility]  Housing: Low Risk  (07/15/2024)   Received from [facility]  Transportation Needs: No Transportation Needs (07/15/2024)   Received from [facility]  Utilities: Patient Unable To Answer (08/23/2024)  Financial Resource Strain: Low Risk  (10/04/2018)   Received from [facility] visits prior to 02/14/2023.  Physical Activity: Inactive (10/04/2018)   Received from [facility] visits prior to 02/14/2023.  Social Connections: Patient Unable To Answer (08/23/2024)  Stress: No Stress Concern Present (10/04/2018)   Received from [facility] visits prior to 02/14/2023.  Tobacco Use: Medium Risk (08/23/2024)   SDOH Interventions:     Readmission Risk Interventions     No data to display

## 2024-08-24 NOTE — Hospital Course (Addendum)
 Brief Narrative:  73 year old with history of HTN, HLD, A-fib status post LAAO with watchman's device in 2021, CAD status post CABG, DM2, ICH 2021, cerebral amyloid angiopathy, CVA, hypothyroidism, chronic back pain comes to the hospital for being unresponsive for several minutes followed by right arm weakness.  Initially code stroke was called and neurology was consulted.  CT of the head showed large left-sided remote MCA territory CVA, MRI brain negative and EEG showed slow cortical dysfunction of the left central parietal region.  Briefly started on Keppra  but due to drowsiness it was discontinued. Eventually due to no improvement in his condition palliative care team was consulted.  After prolonged discussion with the patient's family members, he was transition to hospice/comfort.  Arrangements at home were made.  Assessment & Plan:   Fall with subsequent episode of unresponsiveness Acute encephalopathy Concerns of possible seizure activity.Initially code stroke was called and neurology was consulted.  CT of the head showed large left-sided remote MCA territory CVA, MRI brain negative and EEG showed slow cortical dysfunction of the left central parietal region.  Briefly started on Keppra  but due to drowsiness this was discontinued.  Repeat EEG shows generalized slowing.  Patient Completed 5 days of high-dose IV thiamine  now on p.o. -Folate, B12, TSH, UA ammonia are normal - Zyprexa  and Keppra  discontinued due to drowsiness  RPR, reactive -Titer are 1:1. treponemal antibody test is negative.  This is likely false positive   Uncontrolled diabetes mellitus type 2  Uncontrolled, A1c 6.0.  Continue sliding scale and Accu-Chek but due to poor oral intake, discontinue Lantus    ESRD on HD Nephrology following.   Heart failure with preserved ejection fraction, EF 55% Stable.  Recent outpatient echo on 8/1 shows EF of 55%   Hypokalemia As needed repletion   Permanent atrial fibrillation   Currently rate controlled. S/p Watchman device On Coreg  and aspirin    Anemia of chronic disease Hemoglobin remains around 8.5   CAD s/p CABG - Continue aspirin , statin, beta-blocker   Hypothyroidism - Continue levothyroxine   Extensive goals of care discussion with the family.  Will transition patient home with home health services with palliative care team service to follow this patient.  In near future I suspect patient will need to be transition to hospice. Will confirm he is DNR/DNI.    DVT prophylaxis: Heparin  DNR/DNI Advance Care Planning:     Consults: Neurology and nephrology   Status is: Inpatient Remains inpatient appropriate because:On going discussions with Palliative.   Plans to transition home with home hospice  Subjective:  Doing ok no complaints.  Examination:  General exam: Appears calm and comfortable ; sluggish Respiratory system: Clear to auscultation. Respiratory effort normal. Cardiovascular system: S1 & S2 heard, RRR. No JVD, murmurs, rubs, gallops or clicks. No pedal edema. Gastrointestinal system: Abdomen is nondistended, soft and nontender. No organomegaly or masses felt. Normal bowel sounds heard. Central nervous system: Alert and oriented to name only. No focal neurological deficits. Extremities: Symmetric 4 x 5 power. Skin: No rashes, lesions or ulcers Psychiatry: Judgement and insight appear poor

## 2024-08-24 NOTE — Plan of Care (Addendum)
 RN reported that patient missed dialysis.  Given the patient has been admitted 9/9 nephrology has not been consulted yet so far. -Checking BMP and chest x-ray to make sure there is no electrolyte derangement or pulmonary edema. -On-call nephrology Dr. Melia has been informed tonight by patient's RN and nephrology is concerned that is being 2 days and why in the daytime the nephrology has not been approached yet for dialysis.  Scheryl Sanborn, MD [facility] 08/24/2024, 9:54 PM     Updated 11:10 PM, chest x-ray showing pulmonary vascular congestion with progressive pulmonary edema.  BMP no evidence of electrolyte derangement.  Patient is hemodynamically stable. Informed and requested on-call nephrology Dr. Melia to add this patient on nephrology list for dialysis in the morning.  Lonzie Simmer, MD [facility] 08/24/2024, 11:11 PM

## 2024-08-24 NOTE — Evaluation (Signed)
 Physical Therapy Evaluation Patient Details Name: Robert Spears MRN: [id number] DOB: [birth year] Today's Date: 08/24/2024  History of Present Illness  Pt is a 73 yo male admitted from SNF sp fall and then unresponsive 15 minutes later. CT-. Pt on continuous sz monitoring at this time. Pt with encephalopathy. 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  Clinical Impression   Pt admitted with above diagnosis. Lived at home with wife, in a single-level home; Independent until this hospitalization in August; Prior to this admission, pt was working on standing with therapies at rehab; Presents to PT with muscle weakness, decr coordination, slowed cognition, decr functional mobility; Quite a functional decline compared to his baseline earlier this year; Needs MOD assist to come to EOB, Mod assist to maintain sitting balance, and Max assist of 2 to perform sit to stand and hold standing for up to 15 seconds;  Recommend back to post-acute rehab once medically stable; Pt currently with functional limitations due to the deficits listed below (see PT Problem List). Pt will benefit from skilled PT to increase their independence and safety with mobility to allow discharge to the venue listed below.           If plan is discharge home, recommend the following: Two people to help with walking and/or transfers;Two people to help with bathing/dressing/bathroom   Can travel by private vehicle   No    Equipment Recommendations Wheelchair (measurements PT);Wheelchair cushion (measurements PT);Hospital bed  Recommendations for Other Services       Functional Status Assessment Patient has had a recent decline in their functional status and demonstrates the ability to make significant improvements in function in a reasonable and predictable amount of time.     Precautions / Restrictions  Precautions Precautions: Fall;Other (comment) (coninuous sz monitoring) Recall of Precautions/Restrictions: Impaired Restrictions Weight Bearing Restrictions Per Provider Order: No      Mobility  Bed Mobility Overal bed mobility: Needs Assistance Bed Mobility: Rolling, Sit to Supine, Supine to Sit Rolling: Min assist   Supine to sit: Mod assist Sit to supine: Mod assist   General bed mobility comments: assist getting legs back into bed and assist getting to full sitting position bc pushing up with weak R arm.    Transfers Overall transfer level: Needs assistance Equipment used: Ambulation equipment used Transfers: Sit to/from Stand Sit to Stand: Max assist, Mod assist, +2 physical assistance, +2 safety/equipment, Via lift equipment           General transfer comment: Pt unable to problem solving standing on first attempt. Cues for using the stedy bar to pull up on; Heavy mod assist to stand to stedy (noted stool when able to clear hips from bed); 2 person assist with stedy, and pt able to stand long enough for hygeine Transfer via Lift Equipment: Stedy  Ambulation/Gait                  Stairs            Wheelchair Mobility     Tilt Bed    Modified Rankin (Stroke Patients Only)       Balance Overall balance assessment: Needs assistance Sitting-balance support: Feet supported Sitting balance-Leahy Scale: Poor Sitting balance - Comments: slight r lean Postural control: Right lateral lean Standing balance support: Bilateral upper extremity supported, During functional activity Standing balance-Leahy Scale: Poor Standing balance comment: Pt dependent  on outside support to stand.                             Pertinent Vitals/Pain Pain Assessment Pain Assessment: Faces Faces Pain Scale: Hurts a little bit Pain Location: R leg wound Pain Descriptors / Indicators: Sore, Grimacing Pain Intervention(s): Limited activity within patient's  tolerance, Other (comment) (applied kerlix bandage)    Home Living Family/patient expects to be discharged to:: Skilled nursing facility                   Additional Comments: Was at home prior to last hospitalization 7/25 and has been in SNF since then; wife has had a ramp installed for home access    Prior Function Prior Level of Function : Needs assist  Cognitive Assist : Mobility (cognitive);ADLs (cognitive) Mobility (Cognitive): Step by step cues ADLs (Cognitive): Step by step cues Physical Assist : Mobility (physical);ADLs (physical) Mobility (physical): Bed mobility;Transfers;Gait;Stairs ADLs (physical): Feeding;Grooming;Toileting;Dressing;Bathing;IADLs Mobility Comments: chart states pt was nonambulatory in SNF ADLs Comments: Unsure of care in SNF. Wife was not answering phone     Extremity/Trunk Assessment   Upper Extremity Assessment Upper Extremity Assessment: Defer to OT evaluation RUE Deficits / Details: dificult to assess due to cognition but appears weaker on R than L RUE Sensation:  (unsure) RUE Coordination: decreased fine motor;decreased gross motor    Lower Extremity Assessment Lower Extremity Assessment: Generalized weakness       Communication   Communication Communication: Impaired Factors Affecting Communication: Difficulty expressing self;Reduced clarity of speech    Cognition Arousal: Lethargic Behavior During Therapy: Anxious                             Following commands: Impaired Following commands impaired: Follows one step commands with increased time     Cueing Cueing Techniques: Verbal cues     General Comments General comments (skin integrity, edema, etc.): Wife present and helpful    Exercises     Assessment/Plan    PT Assessment Patient needs continued PT services  PT Problem List Decreased strength;Decreased range of motion;Decreased activity tolerance;Decreased balance;Decreased coordination;Decreased  mobility;Decreased cognition;Decreased knowledge of use of DME;Decreased safety awareness;Decreased knowledge of precautions;Cardiopulmonary status limiting activity       PT Treatment Interventions DME instruction;Gait training;Functional mobility training;Therapeutic activities;Therapeutic exercise;Balance training;Neuromuscular re-education;Cognitive remediation;Wheelchair mobility training;Patient/family education;Manual techniques    PT Goals (Current goals can be found in the Care Plan section)  Acute Rehab PT Goals Patient Stated Goal: get home PT Goal Formulation: With patient Time For Goal Achievement: 09/07/24 Potential to Achieve Goals: Good    Frequency Min 2X/week     Co-evaluation               AM-PAC PT 6 Clicks Mobility  Outcome Measure Help needed turning from your back to your side while in a flat bed without using bedrails?: A Lot Help needed moving from lying on your back to sitting on the side of a flat bed without using bedrails?: A Lot Help needed moving to and from a bed to a chair (including a wheelchair)?: Total Help needed standing up from a chair using your arms (e.g., wheelchair or bedside chair)?: Total Help needed to walk in hospital room?: Total Help needed climbing 3-5 steps with a railing? : Total 6 Click Score: 8    End of Session Equipment Utilized During Treatment: Gait belt Activity Tolerance:  Patient tolerated treatment well;Other (comment) (though fatigued at end of session) Patient left: in bed;with call bell/phone within reach;with bed alarm set;with family/visitor present Nurse Communication: Mobility status PT Visit Diagnosis: Unsteadiness on feet (R26.81);Other abnormalities of gait and mobility (R26.89);Muscle weakness (generalized) (M62.81);History of falling (Z91.81);Difficulty in walking, not elsewhere classified (R26.2)    Time: 8890-8855 PT Time Calculation (min) (ACUTE ONLY): 35 min   Charges:   PT Evaluation $PT  Eval Moderate Complexity: 1 Mod PT Treatments $Therapeutic Activity: 8-22 mins PT General Charges $$ ACUTE PT VISIT: 1 Visit         Silvano Currier, PT  Acute Rehabilitation Services Office [phone] Secure Chat welcomed   Silvano VEAR Currier 08/24/2024, 1:51 PM

## 2024-08-24 NOTE — Progress Notes (Addendum)
Re Hook  LTM EEG hooked up and running - no initial skin breakdown - push button tested - [facility] monitoring.

## 2024-08-24 NOTE — Progress Notes (Signed)
 NEUROLOGY CONSULT FOLLOW UP NOTE   Date of service: August 24, 2024 [name] MRN:  [id number] DOB:  12/09/1951  Interval Hx/subjective  Patient remains hemodynamically stable and afebrile.  He has been hooked up to LTM EEG but unfortunately removed his leads overnight.  They have since been replaced, and read is pending. Vitals   Vitals:   08/23/24 1620 08/23/24 1936 08/24/24 0619 08/24/24 0815  BP: (!) 166/77 (!) 160/77 (!) 172/83 (!) 175/88  Pulse:  81 67 81  Resp: 18 16 20 18   Temp: 98.5 F (36.9 C) 97.9 F (36.6 C) (!) 97.4 F (36.3 C) 97.6 F (36.4 C)  TempSrc: Oral  Oral Oral  SpO2: 94% 98% 100% 99%  Weight:      Height:         Body mass index is 27.63 kg/m.  Physical Exam   Constitutional: Well-developed elderly patient with some signs of muscle wasting, including temporal muscle wasting Psych: Affect appropriate to situation.  Eyes: No scleral injection.  HENT: No OP obstrucion.  Head: Normocephalic.  Respiratory: Effort normal, non-labored breathing on room air Skin: Skin tear to right lower extremity  Neurologic Examination    NEURO:  Mental Status: Patient is able to state his name but cannot state where he is and is disoriented to situation Speech/Language: speech is with mild dysarthria and expressive as well as receptive aphasia  Cranial Nerves:  II: PERRL. III, IV, VI: Tracks examiner around the room VII: Face is symmetrical resting and speaking VIII: hearing intact to voice. IX, X: Voice is slightly dysarthric Motor: Able to move all 4 extremities with antigravity strength,  Tone: is normal and bulk is normal Sensation- Intact to light touch bilaterally.   Gait- deferred   Medications  Current Facility-Administered Medications:    (feeding supplement) PROSource Plus liquid 30 mL, 30 mL, Oral, BID, Smith, Rondell A, MD, 30 mL at 08/23/24 2334   acetaminophen  (TYLENOL ) tablet 650 mg, 650 mg, Oral, Q6H PRN, 650 mg at  08/23/24 2328 **OR** acetaminophen  (TYLENOL ) suppository 650 mg, 650 mg, Rectal, Q6H PRN, Claudene, Rondell A, MD   amLODipine  (NORVASC ) tablet 2.5 mg, 2.5 mg, Oral, Daily, Smith, Rondell A, MD, 2.5 mg at 08/23/24 2332   aspirin  tablet 325 mg, 325 mg, Oral, Daily, Smith, Rondell A, MD, 325 mg at 08/23/24 2329   atorvastatin  (LIPITOR) tablet 20 mg, 20 mg, Oral, q1800, Smith, Rondell A, MD   carvedilol  (COREG ) tablet 25 mg, 25 mg, Oral, BID, Smith, Rondell A, MD, 25 mg at 08/23/24 2329   Chlorhexidine  Gluconate Cloth 2 % PADS 6 each, 6 each, Topical, Daily, Amin, Ankit C, MD   ezetimibe  (ZETIA ) tablet 10 mg, 10 mg, Oral, Daily, Smith, Rondell A, MD, 10 mg at 08/23/24 2332   ferrous sulfate  tablet 325 mg, 325 mg, Oral, Q breakfast, Smith, Rondell A, MD   glucagon  (human recombinant) (GLUCAGEN) injection 1 mg, 1 mg, Intravenous, PRN, Amin, Ankit C, MD   guaiFENesin  (ROBITUSSIN) 100 MG/5ML liquid 5 mL, 5 mL, Oral, Q4H PRN, Amin, Ankit C, MD   heparin  injection 5,000 Units, 5,000 Units, Subcutaneous, Q8H, Smith, Rondell A, MD, 5,000 Units at 08/23/24 2330   hydrALAZINE  (APRESOLINE ) injection 10 mg, 10 mg, Intravenous, Q4H PRN, Amin, Ankit C, MD   hydrALAZINE  (APRESOLINE ) tablet 25 mg, 25 mg, Oral, TID, Smith, Rondell A, MD, 25 mg at 08/23/24 2331   Influenza vac split trivalent PF (FLUZONE HIGH-DOSE) injection 0.5 mL, 0.5 mL, Intramuscular, Tomorrow-1000, Smith,  Rondell A, MD   insulin  aspart (novoLOG ) injection 0-6 Units, 0-6 Units, Subcutaneous, TID WC, Smith, Rondell A, MD   insulin  glargine (LANTUS ) injection 8 Units, 8 Units, Subcutaneous, QHS, Smith, Rondell A, MD, 8 Units at 08/23/24 2332   ipratropium-albuterol  (DUONEB) 0.5-2.5 (3) MG/3ML nebulizer solution 3 mL, 3 mL, Nebulization, Q4H PRN, Amin, Ankit C, MD   levothyroxine  (SYNTHROID ) tablet 50 mcg, 50 mcg, Oral, QAC breakfast, Smith, Rondell A, MD   metoprolol  tartrate (LOPRESSOR ) injection 5 mg, 5 mg, Intravenous, Q4H PRN, Amin, Ankit C, MD    multivitamin with minerals tablet 1 tablet, 1 tablet, Oral, Daily, Claudene, Rondell A, MD, 1 tablet at 08/23/24 2331   OLANZapine  (ZYPREXA ) tablet 2.5 mg, 2.5 mg, Oral, QHS, Smith, Rondell A, MD, 2.5 mg at 08/23/24 2329   ondansetron  (ZOFRAN ) tablet 4 mg, 4 mg, Oral, Q6H PRN **OR** ondansetron  (ZOFRAN ) injection 4 mg, 4 mg, Intravenous, Q6H PRN, Claudene, Rondell A, MD   polyethylene glycol (MIRALAX  / GLYCOLAX ) packet 17 g, 17 g, Oral, Daily, Smith, Rondell A, MD, 17 g at 08/23/24 2328   potassium chloride  10 mEq in 100 mL IVPB, 10 mEq, Intravenous, Q1 Hr x 4, Amin, Ankit C, MD   potassium chloride  SA (KLOR-CON  M) CR tablet 20 mEq, 20 mEq, Oral, STAT, Smith, Rondell A, MD   senna-docusate (Senokot-S) tablet 1 tablet, 1 tablet, Oral, QHS PRN, Amin, Ankit C, MD   senna-docusate (Senokot-S) tablet 2 tablet, 2 tablet, Oral, Daily, Claudene Reeves A, MD, 2 tablet at 08/23/24 2329   sodium bicarbonate  tablet 650 mg, 650 mg, Oral, TID, Claudene Reeves A, MD, 650 mg at 08/23/24 2330   sodium chloride  flush (NS) 0.9 % injection 10-40 mL, 10-40 mL, Intracatheter, Q12H, Amin, Ankit C, MD   sodium chloride  flush (NS) 0.9 % injection 10-40 mL, 10-40 mL, Intracatheter, PRN, Amin, Ankit C, MD   sodium chloride  flush (NS) 0.9 % injection 3 mL, 3 mL, Intravenous, Q12H, Smith, Rondell A, MD, 3 mL at 08/23/24 2331   thiamine  (VITAMIN B1) injection 100 mg, 100 mg, Intravenous, Daily, de Clint Kill, Cortney E, NP, 100 mg at 08/23/24 1247  Labs and Diagnostic Imaging   CBC:  Recent Labs  Lab 08/22/24 2314 08/24/24 0230  WBC 8.7 8.8  NEUTROABS 7.3  --   HGB 8.8*  9.5* 9.0*  HCT 27.9*  28.0* 28.2*  MCV 92.1 91.0  PLT 159 161    Basic Metabolic Panel:  Lab Results  Component Value Date   NA 135 08/24/2024   K 3.1 (L) 08/24/2024   CO2 28 08/24/2024   GLUCOSE 142 (H) 08/24/2024   BUN 37 (H) 08/24/2024   CREATININE 3.11 (H) 08/24/2024   CALCIUM  8.0 (L) 08/24/2024   GFRNONAA 20 (L) 08/24/2024   GFRAA 48 (L)  07/03/2020   Lipid Panel: No results found for: LDLCALC HgbA1c:  Lab Results  Component Value Date   HGBA1C 6.0 (H) 08/23/2024   Urine Drug Screen: No results found for: LABOPIA, COCAINSCRNUR, LABBENZ, AMPHETMU, THCU, LABBARB  Alcohol Level     Component Value Date/Time   ETH <15 08/22/2024 2314   INR  Lab Results  Component Value Date   INR 1.2 08/22/2024   APTT  Lab Results  Component Value Date   APTT 28 08/22/2024   Thiamine  pending B12 758 Folate 11.0 TSH 3.445 RPR reactive Treponema palladium antibodies pending Homocystine pending  CT Head without contrast(Personally reviewed): No acute abnormality, atrophy with chronic small vessel ischemic disease and remote  left MCA territory infarct  MRI Brain(Personally reviewed): No acute abnormality, chronic small vessel ischemic disease, left MCA territory chronic infarct and scattered microhemorrhages  rEEG:  - Continuous slow, left centro- parietal region This study is suggestive of cortical dysfunction arising from left centro- parietal region likely secondary to underlying structural abnormality. No seizures or definite epileptiform discharges were seen throughout the recording.  Continuous EEG: Pending  Assessment   Robert Spears is a 73 y.o. male with history of hypertension, diabetes, hyperlipidemia, CAD status post CABG, left occipital IPH in 2021, CAA, left parietal lobe infarct in 2021, A-fib status post Watchman device not on anticoagulation, left corona radiata stroke and end-stage renal disease on dialysis with recent admission for CHF and AKI who presents from his facility after being found unresponsive after a fall.  Patient was found in his rehab facility on the floor by his bed after an unwitnessed fall.  Staff placed him in a chair and then 15 minutes later noted him to be unresponsive.  Patient's family reports that his mental status has not come back to baseline and states that before  his hospitalization 6 weeks ago, he was mowing the lawn and was independent with all ADLs.  Patient's family reports that he has never had a seizure, that no other family members have seizures of that he has never had a serious head injury, meningitis or encephalitis.  However, given his left parietal stroke, he is at risk for seizure activity.  Family also reports that he had a similar episode of unresponsiveness while he was at the hospital in [place].  Routine EEG demonstrated no seizures, but will place on LTM EEG given continued confusion.  MRI reveals no acute abnormality.  Given muscle wasting seen on exam and reports from family of poor appetite lately, will send nutritional labs and begin empiric thiamine  supplementation.  RPR was noted to be reactive, awaiting result of treponema palladium antibody lab.  Recommendations  - Continue LTM EEG, await read now that leads have been replaced and plan to start AEDs after repeat - Follow-up with thiamine , homocystine and treponema palladium antibodies - Continue empiric thiamine  supplementation - Neurology will continue to follow - treponemal Ab is pending. ______________________________________________________________________ Patient seen by NP with MD, MD to edit note as needed.  Signed, Cortney E Everitt Clint Kill, NP [facility]  NEUROHOSPITALIST ADDENDUM Performed a face to face diagnostic evaluation.   I have reviewed the contents of history and physical exam as documented by PA/ARNP/Resident and agree with above documentation.  I have discussed and formulated the above plan as documented. Edits to the note have been made as needed.  Tyger Oka, MD [facility] [phone]   If 7pm to 7am, please call on call as listed on AMION.

## 2024-08-24 NOTE — Procedures (Signed)
 [name]  MRN: [id number]  Epilepsy Attending: Arlin MALVA Krebs  Referring Physician/Provider:de Clint Abbey Earle FORBES, NP  Duration: 08/23/2024 1152 to 08/24/2024 1800   Patient history: 73yo M with an episode of unresponsiveness 15 minutes after a fall. EEG to evaluate for seizure   Level of alertness: Awake, asleep   AEDs during EEG study: None   Technical aspects: This EEG study was done with scalp electrodes positioned according to the 10-20 International system of electrode placement. Electrical activity was reviewed with band pass filter of 1-70Hz , sensitivity of 7 uV/mm, display speed of 63mm/sec with a 60Hz  notched filter applied as appropriate. EEG data were recorded continuously and digitally stored.  Video monitoring was available and reviewed as appropriate.   Description: The posterior dominant rhythm consists of 8 Hz activity of moderate voltage (25-35 uV) seen predominantly in posterior head regions, symmetric and reactive to eye opening and eye closing. Sleep was characterized by vertex waves, sleep spindles (12 to 14 Hz), maximal frontocentral region. EEG showed continuous 3-5hz  theta- delta slowing in left centro-parietal region which at times appeared high amplitude and sharply contoured lasting for about 1-2 minutes without definite evolution.  Intermittent generalized 3 to 6 Hz theta- delta slowing was also noted hyperventilation and photic stimulation were not performed.     EEG was not interpretable between 08/23/2024 2254 to 08/24/2024 0503 due to electrode artifact   ABNORMALITY - Continuous slow, left centro- parietal region - Intermittent slow, generalized   IMPRESSION: This study is suggestive of cortical dysfunction arising from left centro- parietal region likely secondary to underlying structural abnormality.  Additionally there is mild diffuse encephalopathy. No seizures or definite epileptiform discharges were seen throughout the recording.    Wilhelmine Krogstad O Kesley Mullens

## 2024-08-24 NOTE — Evaluation (Signed)
 Occupational Therapy Evaluation Patient Details Name: Robert Spears MRN: [id number] DOB: [birth year] Today's Date: 08/24/2024   History of Present Illness   Pt is a 73 yo male admitted from SNF sp fall and then unresponsive 15 minutes later. CT-. Pt on continuous sz monitoring at this time. Pt with encephalopathy. 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     Clinical Impressions Pt admitted with the above diagnosis and has the deficits outlined below. Pt would benefit from cont OT to increase independence with basic adls to decrease the burden of care on his caregivers. Pt was previously modified independent prior to summer 2025 hospitalization where he was d/c'd to SNF.  Chart states he has been nonambulatory in SNF. Feel pt has potential to improve and would benefit from <3 hours of therapy a day after acute d/c. Will continue to see with further assessment of vision and cognition and focus on adls.     If plan is discharge home, recommend the following:   Two people to help with walking and/or transfers;Two people to help with bathing/dressing/bathroom;Assistance with cooking/housework;Assistance with feeding;Direct supervision/assist for medications management;Direct supervision/assist for financial management;Assist for transportation;Help with stairs or ramp for entrance;Supervision due to cognitive status     Functional Status Assessment   Patient has had a recent decline in their functional status and demonstrates the ability to make significant improvements in function in a reasonable and predictable amount of time.     Equipment Recommendations   Other (comment) (tbd)     Recommendations for Other Services         Precautions/Restrictions   Precautions Precautions: Fall;Other (comment) (coninuous sz monitoring) Recall of Precautions/Restrictions:  Impaired Restrictions Weight Bearing Restrictions Per Provider Order: No     Mobility Bed Mobility Overal bed mobility: Needs Assistance Bed Mobility: Rolling, Sit to Supine, Supine to Sit Rolling: Min assist   Supine to sit: Mod assist Sit to supine: Mod assist   General bed mobility comments: assist getting legs back into bed and assist getting to full sitting position bc pushing up with weak R arm.    Transfers Overall transfer level: Needs assistance Equipment used: 1 person hand held assist Transfers: Sit to/from Stand Sit to Stand: Max assist           General transfer comment: Pt unable to problem solving standing on first attempt. On second attempt, pt able ot assist with pushing up into full stand but could not maintain for more than 5 seconds.      Balance Overall balance assessment: Needs assistance Sitting-balance support: Feet supported Sitting balance-Leahy Scale: Poor Sitting balance - Comments: slight r lean Postural control: Right lateral lean Standing balance support: Bilateral upper extremity supported, During functional activity Standing balance-Leahy Scale: Poor Standing balance comment: Pt dependent on outside support to stand.                           ADL either performed or assessed with clinical judgement   ADL Overall ADL's : Needs assistance/impaired Eating/Feeding: Maximal assistance;Sitting   Grooming: Wash/dry hands;Wash/dry face;Oral care;Maximal assistance;Sitting   Upper Body Bathing: Maximal assistance;Sitting   Lower Body Bathing: Total assistance;Sit to/from stand;Cueing for compensatory techniques   Upper Body Dressing : Maximal assistance;Sitting Upper Body Dressing Details (indicate cue type and reason): difficulty due to monitoring lines Lower Body Dressing: Total assistance;Sit  to/from Database administrator: Maximal assistance;+2 for physical assistance;BSC/3in1   Toileting- Architect and  Hygiene: Sit to/from stand;Total assistance       Functional mobility during ADLs: Maximal assistance General ADL Comments: Pt limited due to cognition and weakness, motor planning deficits     Vision Baseline Vision/History:  (unsure) Ability to See in Adequate Light: 1 Impaired Vision Assessment?: Vision impaired- to be further tested in functional context Additional Comments: Vision appears to possibly be imapaire.  Dificult to assess due to pts cognitive status.     Perception Perception: Not tested       Praxis Praxis: Not tested       Pertinent Vitals/Pain Pain Assessment Pain Assessment: Faces Faces Pain Scale: Hurts a little bit Pain Location: R leg wound Pain Descriptors / Indicators: Sore, Grimacing Pain Intervention(s): Limited activity within patient's tolerance, Monitored during session, Repositioned     Extremity/Trunk Assessment Upper Extremity Assessment Upper Extremity Assessment: RUE deficits/detail RUE Deficits / Details: dificult to assess due to cognition but appears weaker on R than L RUE Sensation:  (unsure) RUE Coordination: decreased fine motor;decreased gross motor   Lower Extremity Assessment Lower Extremity Assessment: Defer to PT evaluation       Communication Communication Communication: Impaired Factors Affecting Communication: Difficulty expressing self;Reduced clarity of speech   Cognition Arousal: Lethargic Behavior During Therapy: Anxious Cognition: History of cognitive impairments, No family/caregiver present to determine baseline, Cognition impaired   Orientation impairments: Person, Time, Place, Situation Awareness: Online awareness impaired, Intellectual awareness impaired Memory impairment (select all impairments): Working [employer] fast streamer, Short-term memory, Non-declarative long-term memory, Declarative long-term memory Attention impairment (select first level of impairment): Focused attention Executive functioning impairment  (select all impairments): Organization, Sequencing, Reasoning, Problem solving OT - Cognition Comments: Pt following very simple one step commands only                 Following commands: Impaired Following commands impaired: Follows one step commands with increased time     Cueing  General Comments   Cueing Techniques: Verbal cues  Attempted to call wife with no answer to find out how much pt was doing for  self in snf. pt was mod I prior to hospitalization this summer.   Exercises     Shoulder Instructions      Home Living Family/patient expects to be discharged to:: Skilled nursing facility                                 Additional Comments: Was at home prior to last hospitalization 7/25 and has been in SNF since then.      Prior Functioning/Environment Prior Level of Function : Needs assist  Cognitive Assist : Mobility (cognitive);ADLs (cognitive) Mobility (Cognitive): Step by step cues ADLs (Cognitive): Step by step cues Physical Assist : Mobility (physical);ADLs (physical) Mobility (physical): Bed mobility;Transfers;Gait;Stairs ADLs (physical): Feeding;Grooming;Toileting;Dressing;Bathing;IADLs Mobility Comments: chart states pt was nonambulatory in SNF ADLs Comments: Unsure of care in SNF. Wife was not answering phone    OT Problem List: Decreased strength;Decreased activity tolerance;Impaired balance (sitting and/or standing);Impaired vision/perception;Decreased coordination;Decreased cognition;Decreased safety awareness;Decreased knowledge of use of DME or AE;Decreased knowledge of precautions;Impaired UE functional use;Pain   OT Treatment/Interventions: Self-care/ADL training;Therapeutic activities;DME and/or AE instruction;Balance training      OT Goals(Current goals can be found in the care plan section)   Acute Rehab OT Goals Patient Stated Goal: unable OT Goal Formulation: Patient unable to  participate in goal setting Time For Goal  Achievement: 09/07/24 Potential to Achieve Goals: Fair ADL Goals Pt Will Perform Eating: with min assist;sitting Pt Will Perform Grooming: with min assist;sitting Pt Will Perform Lower Body Bathing: with mod assist;sit to/from stand Pt Will Perform Lower Body Dressing: with mod assist;sit to/from stand Pt Will Transfer to Toilet: with min assist;stand pivot transfer;bedside commode Additional ADL Goal #1: Pt will follow 2 step commands with 100% accuracy to increase participation in therapy.   OT Frequency:  Min 1X/week    Co-evaluation              AM-PAC OT 6 Clicks Daily Activity     Outcome Measure Help from another person eating meals?: A Lot Help from another person taking care of personal grooming?: A Lot Help from another person toileting, which includes using toliet, bedpan, or urinal?: A Lot Help from another person bathing (including washing, rinsing, drying)?: A Lot Help from another person to put on and taking off regular upper body clothing?: A Lot Help from another person to put on and taking off regular lower body clothing?: Total 6 Click Score: 11   End of Session Equipment Utilized During Treatment: Oxygen Nurse Communication: Mobility status  Activity Tolerance: Patient limited by fatigue Patient left: in bed;with call bell/phone within reach;with bed alarm set;Other (comment) (B palm restraints were off on arrival and left off after session.)  OT Visit Diagnosis: Unsteadiness on feet (R26.81);Repeated falls (R29.6);History of falling (Z91.81);Other symptoms and signs involving the nervous system (M70.101)                Time: 9072-9044 OT Time Calculation (min): 28 min Charges:  OT General Charges $OT Visit: 1 Visit OT Evaluation $OT Eval Moderate Complexity: 1 Mod OT Treatments $Self Care/Home Management : 8-22 mins  Joshua Silvano Dragon 08/24/2024, 10:22 AM

## 2024-08-25 ENCOUNTER — Inpatient Hospital Stay (HOSPITAL_COMMUNITY)

## 2024-08-25 ENCOUNTER — Inpatient Hospital Stay (HOSPITAL_COMMUNITY)
Admit: 2024-08-25 | Discharge: 2024-08-25 | Disposition: A | Discharge status: Home / Self Care | Attending: Internal Medicine | Admitting: Internal Medicine

## 2024-08-25 DIAGNOSIS — R569 Unspecified convulsions: Secondary | ICD-10-CM | POA: Diagnosis not present

## 2024-08-25 DIAGNOSIS — G9340 Encephalopathy, unspecified: Secondary | ICD-10-CM | POA: Diagnosis not present

## 2024-08-25 LAB — BASIC METABOLIC PANEL WITH GFR
Anion gap: 14 (ref 5–15)
BUN: 46 mg/dL — ABNORMAL HIGH (ref 8–23)
CO2: 28 mmol/L (ref 22–32)
Calcium: 8.3 mg/dL — ABNORMAL LOW (ref 8.9–10.3)
Chloride: 95 mmol/L — ABNORMAL LOW (ref 98–111)
Creatinine, Ser: 3.56 mg/dL — ABNORMAL HIGH (ref 0.61–1.24)
GFR, Estimated: 17 mL/min — ABNORMAL LOW (ref >60)
Glucose, Bld: 86 mg/dL (ref 70–99)
Potassium: 3.5 mmol/L (ref 3.5–5.1)
Sodium: 137 mmol/L (ref 135–145)

## 2024-08-25 LAB — RENAL FUNCTION PANEL
Albumin: 2.6 g/dL — ABNORMAL LOW (ref 3.5–5.0)
Anion gap: 15 (ref 5–15)
BUN: 50 mg/dL — ABNORMAL HIGH (ref 8–23)
CO2: 25 mmol/L (ref 22–32)
Calcium: 8.1 mg/dL — ABNORMAL LOW (ref 8.9–10.3)
Chloride: 96 mmol/L — ABNORMAL LOW (ref 98–111)
Creatinine, Ser: 3.7 mg/dL — ABNORMAL HIGH (ref 0.61–1.24)
GFR, Estimated: 16 mL/min — ABNORMAL LOW (ref >60)
Glucose, Bld: 96 mg/dL (ref 70–99)
Phosphorus: 4.5 mg/dL (ref 2.5–4.6)
Potassium: 3.4 mmol/L — ABNORMAL LOW (ref 3.5–5.1)
Sodium: 136 mmol/L (ref 135–145)

## 2024-08-25 LAB — CBC
HCT: 27.7 % — ABNORMAL LOW (ref 39.0–52.0)
HCT: 29.6 % — ABNORMAL LOW (ref 39.0–52.0)
Hemoglobin: 8.9 g/dL — ABNORMAL LOW (ref 13.0–17.0)
Hemoglobin: 9.6 g/dL — ABNORMAL LOW (ref 13.0–17.0)
MCH: 29.1 pg (ref 26.0–34.0)
MCH: 29.4 pg (ref 26.0–34.0)
MCHC: 32.1 g/dL (ref 30.0–36.0)
MCHC: 32.4 g/dL (ref 30.0–36.0)
MCV: 90.5 fL (ref 80.0–100.0)
MCV: 90.5 fL (ref 80.0–100.0)
Platelets: 158 K/uL (ref 150–400)
Platelets: 160 K/uL (ref 150–400)
RBC: 3.06 MIL/uL — ABNORMAL LOW (ref 4.22–5.81)
RBC: 3.27 MIL/uL — ABNORMAL LOW (ref 4.22–5.81)
RDW: 17.4 % — ABNORMAL HIGH (ref 11.5–15.5)
RDW: 17.5 % — ABNORMAL HIGH (ref 11.5–15.5)
WBC: 9.2 K/uL (ref 4.0–10.5)
WBC: 9 K/uL (ref 4.0–10.5)
nRBC: 0 % (ref 0.0–0.2)
nRBC: 0 % (ref 0.0–0.2)

## 2024-08-25 LAB — HEPATITIS B SURFACE ANTIGEN: Hepatitis B Surface Ag: NONREACTIVE

## 2024-08-25 LAB — MAGNESIUM: Magnesium: 2.1 mg/dL (ref 1.7–2.4)

## 2024-08-25 LAB — PHOSPHORUS: Phosphorus: 4.5 mg/dL (ref 2.5–4.6)

## 2024-08-25 LAB — T.PALLIDUM AB, TOTAL
T Pallidum Abs: NONREACTIVE
T Pallidum Abs: NONREACTIVE

## 2024-08-25 LAB — GLUCOSE, CAPILLARY
Glucose-Capillary: 93 mg/dL (ref 70–99)
Glucose-Capillary: 83 mg/dL (ref 70–99)
Glucose-Capillary: 87 mg/dL (ref 70–99)

## 2024-08-25 MED ORDER — ALTEPLASE 2 MG IJ SOLR
2.0000 mg | Freq: Once | INTRAMUSCULAR | Status: DC | PRN
  Filled 2024-08-25: qty 2

## 2024-08-25 MED ORDER — LEVETIRACETAM (KEPPRA) 500 MG/5 ML ADULT IV PUSH
250.0000 mg | Freq: Two times a day (BID) | INTRAVENOUS | Status: DC
  Administered 2024-08-25: 250 mg via INTRAVENOUS
  Filled 2024-08-25 (×2): qty 5

## 2024-08-25 MED ORDER — HEPARIN SODIUM (PORCINE) 1000 UNIT/ML IJ SOLN
INTRAMUSCULAR | Status: AC
  Filled 2024-08-25: qty 4

## 2024-08-25 MED ORDER — HEPARIN SODIUM (PORCINE) 1000 UNIT/ML IJ SOLN
INTRAMUSCULAR | Status: AC
  Filled 2024-08-25: qty 1

## 2024-08-25 MED ORDER — PENTAFLUOROPROP-TETRAFLUOROETH EX AERO: 1.0000 | INHALATION_SPRAY | CUTANEOUS | Status: DC | PRN

## 2024-08-25 MED ORDER — CHLORHEXIDINE GLUCONATE CLOTH 2 % EX PADS
6.0000 | MEDICATED_PAD | Freq: Every day | CUTANEOUS | Status: DC
  Administered 2024-08-25 – 2024-08-26 (×2): 6 via TOPICAL

## 2024-08-25 MED ORDER — HEPARIN SODIUM (PORCINE) 1000 UNIT/ML DIALYSIS
1000.0000 [IU] | INTRAMUSCULAR | Status: DC | PRN
  Administered 2024-08-25: 3800 [IU]
  Filled 2024-08-25 (×2): qty 1

## 2024-08-25 MED ORDER — PANTOPRAZOLE SODIUM 40 MG PO TBEC
40.0000 mg | DELAYED_RELEASE_TABLET | Freq: Every day | ORAL | Status: DC
  Administered 2024-08-26 – 2024-09-02 (×8): 40 mg via ORAL
  Filled 2024-08-25 (×2): qty 1
  Filled 2024-08-25: qty 2
  Filled 2024-08-25 (×6): qty 1

## 2024-08-25 MED ORDER — LIDOCAINE-PRILOCAINE 2.5-2.5 % EX CREA
1.0000 | TOPICAL_CREAM | CUTANEOUS | Status: DC | PRN
  Filled 2024-08-25: qty 5

## 2024-08-25 MED ORDER — LIDOCAINE HCL (PF) 1 % IJ SOLN
5.0000 mL | INTRAMUSCULAR | Status: DC | PRN
  Filled 2024-08-25: qty 5

## 2024-08-25 MED ORDER — ANTICOAGULANT SODIUM CITRATE 4% (200MG/5ML) IV SOLN
5.0000 mL | Status: DC | PRN
  Filled 2024-08-25: qty 5

## 2024-08-25 NOTE — Procedures (Addendum)
 [name]  MRN: [id number]  Epilepsy Attending: Arlin MALVA Krebs  Referring Physician/Provider:de Clint Abbey Earle FORBES, NP  Duration: 08/24/2024 1800 to 08/25/2024 9043   Patient history: 73yo M with an episode of unresponsiveness 15 minutes after a fall. EEG to evaluate for seizure   Level of alertness: Awake, asleep   AEDs during EEG study: None   Technical aspects: This EEG study was done with scalp electrodes positioned according to the 10-20 International system of electrode placement. Electrical activity was reviewed with band pass filter of 1-70Hz , sensitivity of 7 uV/mm, display speed of 8mm/sec with a 60Hz  notched filter applied as appropriate. EEG data were recorded continuously and digitally stored.  Video monitoring was available and reviewed as appropriate.   Description: The posterior dominant rhythm consists of 8 Hz activity of moderate voltage (25-35 uV) seen predominantly in posterior head regions, symmetric and reactive to eye opening and eye closing. Sleep was characterized by vertex waves, sleep spindles (12 to 14 Hz), maximal frontocentral region. EEG showed continuous 3-5hz  theta- delta slowing in left centro-parietal region which at times appeared high amplitude and sharply contoured lasting for about 1-2 minutes without definite evolution. Intermittent generalized 3 to 6 Hz theta- delta slowing was also noted hyperventilation and photic stimulation were not performed.      ABNORMALITY - Continuous slow, left centro- parietal region - Intermittent slow, generalized   IMPRESSION: This study is suggestive of cortical dysfunction arising from left centro- parietal region likely secondary to underlying structural abnormality.  Additionally there is mild diffuse encephalopathy. No seizures or definite epileptiform discharges were seen throughout the recording.   Jameka Ivie O Neysa Arts

## 2024-08-25 NOTE — Progress Notes (Signed)
 PROGRESS NOTE    Robert Spears  FMW:[id number] DOB: 16-Feb-1951 DOA: 08/22/2024 PCP: Ofilia Lamar CROME, MD    Brief Narrative:  73 year old with history of HTN, HLD, A-fib status post LAAO with watchman's device in 2021, CAD status post CABG, DM2, ICH 2021, cerebral amyloid angiopathy, CVA, hypothyroidism, chronic back pain comes to the hospital for being unresponsive for several minutes followed by right arm weakness.  Initially code stroke was called and neurology was consulted.  CT of the head showed large left-sided remote MCA territory CVA, MRI brain negative and EEG showed slow cortical dysfunction of the left central parietal region.   Assessment & Plan:   Fall with subsequent episode of unresponsiveness Acute encephalopathy Concerns of possible seizure activity.Initially code stroke was called and neurology was consulted.  CT of the head showed large left-sided remote MCA territory CVA, MRI brain negative and EEG showed slow cortical dysfunction of the left central parietal region.  Will discuss possible need for LP with neurology -Folate, B12, TSH, ammonia are normal UA is negative.   thiamine  500mg  x 5 days DC Zyprexa  for now.   RPR, reactive -Titer are 1:1. Possible false neg or prior hx. Will order Treponemal Ab test.    Uncontrolled diabetes mellitus type 2 with hyperglycemia Uncontrolled, A1c 6.0.  Continue sliding scale and Accu-Cheks   ESRD on HD Nephrology following.   Heart failure with preserved ejection fraction, EF 55% Stable.  Recent outpatient echo on 8/1 shows EF of 55%    Hypokalemia As needed repletion   Permanent atrial fibrillation  Currently rate controlled. S/p Watchman device On Coreg  and aspirin    Anemia of chronic disease Hemoglobin 8.4. - Continue to monitor   CAD s/p CABG - Continue aspirin , statin, beta-blocker   Hypothyroidism - Continue levothyroxine    DVT prophylaxis: Heparin  Advance Care Planning:   Code Status: Full Code      Consults: Neurology and nephrology   Status is: Inpatient Remains inpatient appropriate because: on going eval for weakness.    PT Follow up Recs: Skilled Nursing-Short Term Rehab (<3 Hours/Day)08/24/2024 1300. TOC consulted.   Subjective:  Patient seen at bedside.  Answering very basic questions but very slow to respond.  Wife also present at bedside during my evaluation today.  Examination:  General exam: Appears calm and comfortable ; sluggish Respiratory system: Clear to auscultation. Respiratory effort normal. Cardiovascular system: S1 & S2 heard, RRR. No JVD, murmurs, rubs, gallops or clicks. No pedal edema. Gastrointestinal system: Abdomen is nondistended, soft and nontender. No organomegaly or masses felt. Normal bowel sounds heard. Central nervous system: Alert and oriented. No focal neurological deficits. Extremities: Symmetric 4 x 5 power. Skin: No rashes, lesions or ulcers Psychiatry: Judgement and insight appear poor                Diet Orders (From admission, onward)     Start     Ordered   08/23/24 0934  Diet heart healthy/carb modified Room service appropriate? Yes; Fluid consistency: Thin  Diet effective now       Question Answer Comment  Diet-HS Snack? Nothing   Room service appropriate? Yes   Fluid consistency: Thin      08/23/24 0947            Objective: Vitals:   08/24/24 1645 08/24/24 1946 08/25/24 0416 08/25/24 0829  BP: (!) 158/84 136/80 (!) 187/79 (!) 174/90  Pulse: 73 80 73 75  Resp: 18 19 17 18   Temp: (!) 97.5 F (36.4  C) 97.7 F (36.5 C) 97.6 F (36.4 C) 98 F (36.7 C)  TempSrc: Oral Oral Oral Oral  SpO2: 100% 98% 96% 97%  Weight:      Height:        Intake/Output Summary (Last 24 hours) at 08/25/2024 1005 Last data filed at 08/25/2024 0418 Gross per 24 hour  Intake 240 ml  Output 330 ml  Net -90 ml   Filed Weights   08/22/24 2300 08/22/24 2350  Weight: 95 kg 95 kg    Scheduled Meds:  (feeding supplement)  PROSource Plus  30 mL Oral BID   amLODipine   2.5 mg Oral Daily   aspirin   325 mg Oral Daily   atorvastatin   20 mg Oral q1800   carvedilol   25 mg Oral BID   Chlorhexidine  Gluconate Cloth  6 each Topical Q0600   ezetimibe   10 mg Oral Daily   ferrous sulfate   325 mg Oral Q breakfast   heparin   5,000 Units Subcutaneous Q8H   hydrALAZINE   25 mg Oral TID   Influenza vac split trivalent PF  0.5 mL Intramuscular Tomorrow-1000   insulin  aspart  0-6 Units Subcutaneous TID WC   insulin  glargine  8 Units Subcutaneous QHS   levETIRAcetam   250 mg Intravenous Q12H   levothyroxine   50 mcg Oral QAC breakfast   multivitamin with minerals  1 tablet Oral Daily   polyethylene glycol  17 g Oral Daily   senna-docusate  2 tablet Oral Daily   sodium bicarbonate   650 mg Oral TID   sodium chloride  flush  10-40 mL Intracatheter Q12H   sodium chloride  flush  3 mL Intravenous Q12H   Continuous Infusions:  anticoagulant sodium citrate      thiamine  (VITAMIN B1) injection 500 mg (08/24/24 1345)    Nutritional status     Body mass index is 27.63 kg/m.  Data Reviewed:   CBC: Recent Labs  Lab 08/22/24 2314 08/24/24 0230 08/25/24 0347  WBC 8.7 8.8 9.2  NEUTROABS 7.3  --   --   HGB 8.8*  9.5* 9.0* 8.9*  HCT 27.9*  28.0* 28.2* 27.7*  MCV 92.1 91.0 90.5  PLT 159 161 158   Basic Metabolic Panel: Recent Labs  Lab 08/22/24 2314 08/24/24 0230 08/24/24 2228 08/25/24 0347  NA 133*  136 135 135 137  K 3.2*  3.3* 3.1* 3.7 3.5  CL 93*  92* 94* 94* 95*  CO2 29 28 32 28  GLUCOSE 239*  233* 142* 91 86  BUN 28*  34* 37* 45* 46*  CREATININE 2.61*  2.60* 3.11* 3.53* 3.56*  CALCIUM  7.9* 8.0* 8.2* 8.3*  MG  --  1.8 2.0 2.1  PHOS  --   --   --  4.5   GFR: Estimated Creatinine Clearance: 20.6 mL/min (A) (by C-G formula based on SCr of 3.56 mg/dL (H)). Liver Function Tests: Recent Labs  Lab 08/22/24 2314 08/24/24 0230  AST 17 <10*  ALT 14 11  ALKPHOS 132* 116  BILITOT 1.4* 1.2  PROT 6.3*  5.9*  ALBUMIN 2.8* 2.6*   No results for input(s): LIPASE, AMYLASE in the last 168 hours. Recent Labs  Lab 08/24/24 1449  AMMONIA 20   Coagulation Profile: Recent Labs  Lab 08/22/24 2314  INR 1.2   Cardiac Enzymes: Recent Labs  Lab 08/22/24 2314  CKTOTAL 29*   BNP (last 3 results) No results for input(s): PROBNP in the last 8760 hours. HbA1C: Recent Labs    08/23/24 1503  HGBA1C 6.0*  CBG: Recent Labs  Lab 08/24/24 1158 08/24/24 1711 08/24/24 1801 08/24/24 1947 08/25/24 0746  GLUCAP 76 67* 88 84 93   Lipid Profile: No results for input(s): CHOL, HDL, LDLCALC, TRIG, CHOLHDL, LDLDIRECT in the last 72 hours. Thyroid  Function Tests: Recent Labs    08/23/24 0218  TSH 3.445   Anemia Panel: Recent Labs    08/23/24 0218 08/23/24 1044  VITAMINB12  --  758  FOLATE 11.0  --    Sepsis Labs: Recent Labs  Lab 08/22/24 2320  LATICACIDVEN 0.8    No results found for this or any previous visit (from the past 240 hours).       Radiology Studies: DG CHEST PORT 1 VIEW Result Date: 08/24/2024 CLINICAL DATA:  Short of breath EXAM: PORTABLE CHEST 1 VIEW COMPARISON:  08/04/2024 FINDINGS: Single frontal view of the chest demonstrates stable right internal jugular dialysis catheter. Cardiac silhouette remains enlarged. There is progressive vascular congestion and bilateral airspace disease consistent with worsening edema. Small right pleural effusion. No pneumothorax. IMPRESSION: 1. Congestive heart failure, with worsening volume status and progressive pulmonary edema. Electronically Signed   By: Ozell Daring M.D.   On: 08/24/2024 22:15   Overnight EEG with video Result Date: 08/24/2024 Shelton Arlin KIDD, MD     08/25/2024  9:15 AM [name] MRN: [id number] Epilepsy Attending: Arlin KIDD Shelton Referring Physician/Provider:de Clint Abbey Earle FORBES, NP Duration: 08/23/2024 1152 to 08/24/2024 1800  Patient history: 73yo M with an episode  of unresponsiveness 15 minutes after a fall. EEG to evaluate for seizure  Level of alertness: Awake, asleep  AEDs during EEG study: None  Technical aspects: This EEG study was done with scalp electrodes positioned according to the 10-20 International system of electrode placement. Electrical activity was reviewed with band pass filter of 1-70Hz , sensitivity of 7 uV/mm, display speed of 38mm/sec with a 60Hz  notched filter applied as appropriate. EEG data were recorded continuously and digitally stored.  Video monitoring was available and reviewed as appropriate.  Description: The posterior dominant rhythm consists of 8 Hz activity of moderate voltage (25-35 uV) seen predominantly in posterior head regions, symmetric and reactive to eye opening and eye closing. Sleep was characterized by vertex waves, sleep spindles (12 to 14 Hz), maximal frontocentral region. EEG showed continuous 3-5hz  theta- delta slowing in left centro-parietal region which at times appeared high amplitude and sharply contoured lasting for about 1-2 minutes without definite evolution.  Intermittent generalized 3 to 6 Hz theta- delta slowing was also noted hyperventilation and photic stimulation were not performed.   EEG was not interpretable between 08/23/2024 2254 to 08/24/2024 0503 due to electrode artifact  ABNORMALITY - Continuous slow, left centro- parietal region - Intermittent slow, generalized  IMPRESSION: This study is suggestive of cortical dysfunction arising from left centro- parietal region likely secondary to underlying structural abnormality.  Additionally there is mild diffuse encephalopathy. No seizures or definite epileptiform discharges were seen throughout the recording.  Priyanka O Yadav            LOS: 2 days   Time spent= 35 mins    Burgess JAYSON Dare, MD [facility]  If 7PM-7AM, please contact night-coverage  08/25/2024, 10:05 AM

## 2024-08-25 NOTE — Progress Notes (Signed)
 Pt receives out-pt HD at [facility] on MWF 11:15 am chair time. Will assist as needed.   Randine Mungo Dialysis Navigator [phone]

## 2024-08-25 NOTE — Progress Notes (Signed)
 LTM VIDEO EEG discontinued - no skin breakdown at [facility].

## 2024-08-25 NOTE — Consult Note (Cosign Needed Addendum)
  [facility] Renal Consultation Note    Indication for Consultation:  Management of ESRD/hemodialysis; anemia, hypertension/volume and secondary hyperparathyroidism  ERE:Inlhy, Lamar CROME, MD  HPI: Robert Spears is a 73 y.o. male with a past medical history significant for HTN, CAD, A fib, DM type 3, CKD, and HFpEF.  Patient does not to the ED from facility due to concern for altered mental status this/responsiveness.  Neurology is now following. Outside medical records..  TMs send follow-up in stroke center for HPI.  It is unclear when his first dialysis was.  Per records, has had stable CKD stage III.  Noted.  CR baseline around 2.6.  Seen and examined patient on HD. BP is 156/70. CXR shows CHF with worsening pulmonary edema. He's tolerating UFG 3L.  Patient is not the best historian and this was noted in outside records. He appears comfortable and not in distress. Current Cr is now 3.7 with GFR 16. Doesn't appear he has a foley catheter.  Will continue HD while here and closely monitor his volume, labs, and I&Os.    Past Medical History:  Diagnosis Date   A-fib [facility])    Acute laryngotracheitis 11/05/2020   Formatting of this note might be different from the original. 11/05/2020   Allergy to bee sting    Benign hypertension with CKD (chronic kidney disease) stage III (HCC) 03/22/2019   Cerebral amyloid angiopathy (HCC) 01/22/2016   Formatting of this note might be different from the original. 2016: RIGHT ARM NUMB, RIGHT FACE WEAK, EXP DYSPHASIA; MOD CAROTID PLAQUE, AFIB 2018: residual numbness fingers right hand 2021: left occ-temp hemorrhage on NOAC with ex/rec dysphasia, lower facial droop, LUE weakness   Chronic bilateral low back pain without sciatica 08/01/2016   Formatting of this note might be different from the original. 2018: surg   Coronary artery disease 01/21/2016   Formatting of this note might be different from the original. Managed CARDS (1997) onset (2005) PCI of  RCA, PCI with DES of RCA  post CABS  Formatting of this note might be different from the original. Managed CARDS (1997) onset (2005) PCI of RCA, PCI with DES of RCA  post CABG   Coronary artery disease involving native coronary artery of native heart without angina pectoris 08/09/2015   DDD (degenerative disc disease), lumbar 08/24/2017   Formatting of this note might be different from the original. 2018: surgery   Diabetes mellitus    Dyslipidemia 08/09/2015   Family history of premature coronary artery disease 09/17/2018   Formatting of this note might be different from the original. Brother   Hemiparesis of right dominant side (HCC) 07/24/2020   Formatting of this note might be different from the original. 2021: left occ-temp hemorrhage   High blood pressure    High cholesterol    Hypertension 10/02/2015   Intracranial bleeding (HCC) 06/28/2020   Intraparenchymal hemorrhage of brain (HCC) 06/23/2020   Low vitamin B12 level 07/24/2020   Formatting of this note might be different from the original. 2021: 236   Mixed hyperlipidemia 01/21/2016   Formatting of this note might be different from the original. 01/2015 Not at goal LDL 79 HDL 26 (LDL 70 HDL 40)   Obesity (BMI 30-39.9) 11/01/2020   Permanent atrial fibrillation (HCC) 01/22/2016   Formatting of this note might be different from the original. 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   Swelling of both lower extremities 06/28/2020   TIA (transient ischemic attack) 08/21/2015   Type 2 diabetes mellitus with stage 3b chronic kidney disease, without long-term current use of insulin  (HCC) 01/21/2016   Overview:  04/2015 A1C has increased to 7.6   Uncontrolled hypertension 02/07/2021   Weight loss    Wellness examination 03/09/2020   Past Surgical  History:  Procedure Laterality Date   ARTERIAL BYPASS SURGRY     CARDIAC SURGERY     CORONARY ARTERY BYPASS GRAFT     CORONARY STENT PLACEMENT     LEFT ATRIAL APPENDAGE OCCLUSION  10/09/2020   Family History  Problem Relation Age of Onset   Cancer Sister        Bone   Diabetes Mother    High blood pressure Mother    Heart disease Mother 73   Heart attack Brother    Diabetes Brother    High blood pressure Brother    Heart disease Brother    Other Father 27       MVA   Heart disease Sister    Diabetes Sister    Social History:  reports that he quit smoking about 28 years ago. His smoking use included cigarettes. He has quit using smokeless tobacco. He reports that he does not drink alcohol and does not use drugs. Allergies  Allergen Reactions   Bee Venom    Prior to Admission medications   Medication Sig Start Date End Date Taking? Authorizing Provider  acetaminophen  (TYLENOL ) 500 MG tablet Take 500 mg by mouth every 6 (six) hours as needed for mild pain (pain score 1-3), headache or fever.   Yes [provider]  Amino Acids-Protein Hydrolys (PRO-STAT) LIQD Take 30 mLs by mouth in the morning and at bedtime.   Yes [provider]  amLODipine  (NORVASC ) 2.5 MG tablet Take 1 tablet by mouth. 08/11/24  Yes [provider]  aspirin  325 MG tablet Take 325 mg by mouth daily.   Yes [provider]  atorvastatin  (LIPITOR) 20 MG tablet Take 20 mg by mouth daily at 6 PM. 01/24/16  Yes [provider]  carvedilol  (COREG ) 25 MG tablet Take 1 tablet (25 mg total) by mouth 2 (two) times daily. 11/05/23 01/14/25 Yes Krasowski, Robert J, MD  EPINEPHrine  0.3 mg/0.3 mL IJ SOAJ injection Inject 0.3 mg into the muscle as needed for anaphylaxis.   Yes [provider]  ergocalciferol (VITAMIN D2) 1.25 MG (50000 UT) capsule Take 1 capsule by mouth once a week. Take every Tuesday 02/07/21  Yes [provider]  ezetimibe  (ZETIA ) 10 MG tablet Take  1 tablet by mouth once daily 06/22/24  Yes Krasowski, Robert J, MD  ferrous sulfate  325 (65 FE) MG tablet Take 325 mg by mouth in the morning and at bedtime.   Yes [provider]  hydrALAZINE  (APRESOLINE ) 25 MG tablet Take 25 mg by mouth 3 (three) times daily.   Yes [provider]  insulin  glargine (LANTUS ) 100 UNIT/ML injection Inject 8 Units into the skin at bedtime.   Yes [provider]  levothyroxine  (SYNTHROID ) 50 MCG tablet Take 50 mcg by mouth daily before breakfast.   Yes [provider]  Multiple Vitamin (MULTI-VITAMINS) TABS Take 1 tablet by mouth daily. Unknown strehgth   Yes [provider]  nitroGLYCERIN  (NITROSTAT ) 0.4 MG SL tablet Place 0.4 mg under the tongue every 5 (five) minutes as  needed for chest pain.   Yes [provider]  OLANZapine  (ZYPREXA ) 2.5 MG tablet Take 1 tablet by mouth at bedtime. 08/11/24  Yes [provider]  omeprazole (PRILOSEC) 40 MG capsule Take 40 mg by mouth daily. 08/11/24  Yes [provider]  polyethylene glycol powder (MIRALAX ) 17 GM/SCOOP powder Take 17 g by mouth daily. 08/11/24 08/25/24 Yes [provider]  RYBELSUS 7 MG TABS Take 1 tablet by mouth every morning.   Yes [provider]  senna-docusate (SENOKOT-S) 8.6-50 MG tablet Take 2 tablets by mouth daily. 08/12/24 09/11/24 Yes [provider]  Sennosides 8.6 MG CAPS Take by mouth. 08/11/24  Yes [provider]  sodium bicarbonate  650 MG tablet Take 1 tablet by mouth 3 (three) times daily. 08/11/24  Yes [provider]   Current Facility-Administered Medications  Medication Dose Route Frequency Provider Last Rate Last Admin   (feeding supplement) PROSource Plus liquid 30 mL  30 mL Oral BID Claudene, Rondell A, MD   30 mL at 08/24/24 2336   acetaminophen  (TYLENOL ) tablet 650 mg  650 mg Oral Q6H PRN Smith, Rondell A, MD   650 mg at 08/23/24 2328   Or   acetaminophen  (TYLENOL ) suppository 650  mg  650 mg Rectal Q6H PRN Smith, Rondell A, MD       alteplase  (CATHFLO ACTIVASE ) injection 2 mg  2 mg Intracatheter Once PRN Melia Lynwood ORN, MD       amLODipine  (NORVASC ) tablet 2.5 mg  2.5 mg Oral Daily Smith, Rondell A, MD   2.5 mg at 08/24/24 1000   anticoagulant sodium citrate  solution 5 mL  5 mL Intracatheter PRN Melia Lynwood ORN, MD       aspirin  tablet 325 mg  325 mg Oral Daily Smith, Rondell A, MD   325 mg at 08/24/24 1000   atorvastatin  (LIPITOR) tablet 20 mg  20 mg Oral q1800 Smith, Rondell A, MD   20 mg at 08/24/24 1727   carvedilol  (COREG ) tablet 25 mg  25 mg Oral BID Smith, Rondell A, MD   25 mg at 08/24/24 2335   Chlorhexidine  Gluconate Cloth 2 % PADS 6 each  6 each Topical Q0600 Melia Lynwood ORN, MD   6 each at 08/25/24 [phone]   ezetimibe  (ZETIA ) tablet 10 mg  10 mg Oral Daily Smith, Rondell A, MD   10 mg at 08/24/24 1000   ferrous sulfate  tablet 325 mg  325 mg Oral Q breakfast Smith, Rondell A, MD   325 mg at 08/24/24 0800   glucagon  (human recombinant) (GLUCAGEN) injection 1 mg  1 mg Intravenous PRN Amin, Ankit C, MD       guaiFENesin  (ROBITUSSIN) 100 MG/5ML liquid 5 mL  5 mL Oral Q4H PRN Amin, Ankit C, MD       heparin  injection 1,000 Units  1,000 Units Intracatheter PRN Melia Lynwood ORN, MD       heparin  injection 5,000 Units  5,000 Units Subcutaneous Q8H Smith, Rondell A, MD   5,000 Units at 08/25/24 9380   hydrALAZINE  (APRESOLINE ) injection 10 mg  10 mg Intravenous Q4H PRN Amin, Ankit C, MD       hydrALAZINE  (APRESOLINE ) tablet 25 mg  25 mg Oral TID Smith, Rondell A, MD   25 mg at 08/24/24 2334   Influenza vac split trivalent PF (FLUZONE HIGH-DOSE) injection 0.5 mL  0.5 mL Intramuscular Tomorrow-1000 Smith, Rondell A, MD       insulin  aspart (novoLOG ) injection 0-6 Units  0-6 Units  Subcutaneous TID WC Smith, Rondell A, MD       insulin  glargine (LANTUS ) injection 8 Units  8 Units Subcutaneous QHS Claudene Reeves A, MD   8 Units at 08/24/24 2339   ipratropium-albuterol  (DUONEB) 0.5-2.5 (3)  MG/3ML nebulizer solution 3 mL  3 mL Nebulization Q4H PRN Amin, Ankit C, MD       levETIRAcetam  (KEPPRA ) undiluted injection 250 mg  250 mg Intravenous Q12H Remi Pippin, NP       levothyroxine  (SYNTHROID ) tablet 50 mcg  50 mcg Oral QAC breakfast Claudene Reeves A, MD   50 mcg at 08/24/24 0700   lidocaine  (PF) (XYLOCAINE ) 1 % injection 5 mL  5 mL Intradermal PRN Melia Lynwood ORN, MD       lidocaine -prilocaine  (EMLA ) cream 1 Application  1 Application Topical PRN Melia Lynwood ORN, MD       metoprolol  tartrate (LOPRESSOR ) injection 5 mg  5 mg Intravenous Q4H PRN Amin, Ankit C, MD       multivitamin with minerals tablet 1 tablet  1 tablet Oral Daily Claudene Reeves A, MD   1 tablet at 08/24/24 1000   ondansetron  (ZOFRAN ) tablet 4 mg  4 mg Oral Q6H PRN Claudene Reeves LABOR, MD       Or   ondansetron  (ZOFRAN ) injection 4 mg  4 mg Intravenous Q6H PRN Claudene, Rondell A, MD       pantoprazole  (PROTONIX ) EC tablet 40 mg  40 mg Oral Daily Amin, Ankit C, MD       pentafluoroprop-tetrafluoroeth (GEBAUERS) aerosol 1 Application  1 Application Topical PRN Melia Lynwood ORN, MD       polyethylene glycol (MIRALAX  / GLYCOLAX ) packet 17 g  17 g Oral Daily Claudene, Rondell A, MD   17 g at 08/24/24 1000   senna-docusate (Senokot-S) tablet 1 tablet  1 tablet Oral QHS PRN Amin, Ankit C, MD       senna-docusate (Senokot-S) tablet 2 tablet  2 tablet Oral Daily Claudene Reeves A, MD   2 tablet at 08/24/24 1000   sodium bicarbonate  tablet 650 mg  650 mg Oral TID Claudene Reeves LABOR, MD   650 mg at 08/24/24 2335   sodium chloride  flush (NS) 0.9 % injection 10-40 mL  10-40 mL Intracatheter Q12H Amin, Ankit C, MD   10 mL at 08/24/24 1000   sodium chloride  flush (NS) 0.9 % injection 10-40 mL  10-40 mL Intracatheter PRN Amin, Ankit C, MD       sodium chloride  flush (NS) 0.9 % injection 3 mL  3 mL Intravenous Q12H Smith, Rondell A, MD   3 mL at 08/24/24 1000   thiamine  (VITAMIN B1) 500 mg in sodium chloride  0.9 % 50 mL IVPB  500 mg Intravenous Q24H  Amin, Ankit C, MD 110 mL/hr at 08/24/24 1345 500 mg at 08/24/24 1345   Labs: Basic Metabolic Panel: Recent Labs  Lab 08/24/24 2228 08/25/24 0347 08/25/24 1006  NA 135 137 136  K 3.7 3.5 3.4*  CL 94* 95* 96*  CO2 32 28 25  GLUCOSE 91 86 96  BUN 45* 46* 50*  CREATININE 3.53* 3.56* 3.70*  CALCIUM  8.2* 8.3* 8.1*  PHOS  --  4.5 4.5   Liver Function Tests: Recent Labs  Lab 08/22/24 2314 08/24/24 0230 08/25/24 1006  AST 17 <10*  --   ALT 14 11  --   ALKPHOS 132* 116  --   BILITOT 1.4* 1.2  --   PROT 6.3* 5.9*  --  ALBUMIN 2.8* 2.6* 2.6*   No results for input(s): LIPASE, AMYLASE in the last 168 hours. Recent Labs  Lab 08/24/24 1449  AMMONIA 20   CBC: Recent Labs  Lab 08/22/24 2314 08/24/24 0230 08/25/24 0347 08/25/24 1006  WBC 8.7 8.8 9.2 9.0  NEUTROABS 7.3  --   --   --   HGB 8.8*  9.5* 9.0* 8.9* 9.6*  HCT 27.9*  28.0* 28.2* 27.7* 29.6*  MCV 92.1 91.0 90.5 90.5  PLT 159 161 158 160   Cardiac Enzymes: Recent Labs  Lab 08/22/24 2314  CKTOTAL 29*   CBG: Recent Labs  Lab 08/24/24 1158 08/24/24 1711 08/24/24 1801 08/24/24 1947 08/25/24 0746  GLUCAP 76 67* 88 84 93   Iron Studies: No results for input(s): IRON, TIBC, TRANSFERRIN, FERRITIN in the last 72 hours. Studies/Results: DG CHEST PORT 1 VIEW Result Date: 08/24/2024 CLINICAL DATA:  Short of breath EXAM: PORTABLE CHEST 1 VIEW COMPARISON:  08/04/2024 FINDINGS: Single frontal view of the chest demonstrates stable right internal jugular dialysis catheter. Cardiac silhouette remains enlarged. There is progressive vascular congestion and bilateral airspace disease consistent with worsening edema. Small right pleural effusion. No pneumothorax. IMPRESSION: 1. Congestive heart failure, with worsening volume status and progressive pulmonary edema. Electronically Signed   By: Ozell Daring M.D.   On: 08/24/2024 22:15   Overnight EEG with video Result Date: 08/24/2024 Shelton Arlin KIDD, MD      08/25/2024  9:15 AM Patient Name: France Noyce MRN: [id number] Epilepsy Attending: Arlin KIDD Shelton Referring Physician/Provider:de Clint Abbey Earle FORBES, NP Duration: 08/23/2024 1152 to 08/24/2024 1800  Patient history: 73yo M with an episode of unresponsiveness 15 minutes after a fall. EEG to evaluate for seizure  Level of alertness: Awake, asleep  AEDs during EEG study: None  Technical aspects: This EEG study was done with scalp electrodes positioned according to the 10-20 International system of electrode placement. Electrical activity was reviewed with band pass filter of 1-70Hz , sensitivity of 7 uV/mm, display speed of 89mm/sec with a 60Hz  notched filter applied as appropriate. EEG data were recorded continuously and digitally stored.  Video monitoring was available and reviewed as appropriate.  Description: The posterior dominant rhythm consists of 8 Hz activity of moderate voltage (25-35 uV) seen predominantly in posterior head regions, symmetric and reactive to eye opening and eye closing. Sleep was characterized by vertex waves, sleep spindles (12 to 14 Hz), maximal frontocentral region. EEG showed continuous 3-5hz  theta- delta slowing in left centro-parietal region which at times appeared high amplitude and sharply contoured lasting for about 1-2 minutes without definite evolution.  Intermittent generalized 3 to 6 Hz theta- delta slowing was also noted hyperventilation and photic stimulation were not performed.   EEG was not interpretable between 08/23/2024 2254 to 08/24/2024 0503 due to electrode artifact  ABNORMALITY - Continuous slow, left centro- parietal region - Intermittent slow, generalized  IMPRESSION: This study is suggestive of cortical dysfunction arising from left centro- parietal region likely secondary to underlying structural abnormality.  Additionally there is mild diffuse encephalopathy. No seizures or definite epileptiform discharges were seen throughout the recording.  Priyanka O Yadav      ROS: Patient is not a good historian so unable to obtain.  Physical Exam: Vitals:   08/25/24 1109 08/25/24 1130 08/25/24 1200 08/25/24 1230  BP: (!) 154/84 (!) 157/72 (!) 163/74 (!) 147/80  Pulse: 68 60 75 82  Resp: 16 17 17    Temp:      TempSrc:  SpO2: 93% 93% 90% 92%  Weight:      Height:         General: Chronically ill-appearing, NAD Head: Sclera not icteric Lungs: Clear anteriorly; No wheeze, rales or rhonchi. Breathing is unlabored. Heart: RRR. No murmur, rubs or gallops.  Abdomen: soft and no-tender,  Lower extremities: 1+ R ankle edema; No LLE edema Neuro: Not oriented to himself, time, and location Dialysis Access: [facility]  Dialysis Orders:  MWF - [facility] 4hrs, BFR 400, DFR 600,  EDW 94kg, 2K/ 2.5Ca Heparin  No bolus ordered *Noted he was getting an Fe load in outpatient  Last labs: Hgb 9.6, K 3.4, Ca 8.1, P 4.5, Alb 2.6  Assessment/Plan: AMS - Neurology following; EEG completed ESRD - Followed in [facility] for an AKI. On MWF schedule. 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 Plan for HD for now. Monitor volume, labs, and I&Os closely. Hypertension/volume - Bps coming down with HD Anemia of CKD - Hgb 9.6. Monitor. Start Fe/ESA if indicated Secondary Hyperparathyroidism -  Corr Ca and phos ok Nutrition - Renal diet with fluid restriction  Charmaine Piety, NP [facility] 08/25/2024, 2:51 PM

## 2024-08-25 NOTE — Progress Notes (Signed)
 NEUROLOGY CONSULT FOLLOW UP NOTE   Date of service: August 25, 2024 [name] MRN:  [id number] DOB:  08-Nov-1951  Interval Hx/subjective   Wife at the bedside states he did not sleep much overnight. Seems more confused today. Missed dialysis yesterday, plan for dialysis today. Will start keppra  today.   Vitals   Vitals:   08/24/24 1000 08/24/24 1645 08/24/24 1946 08/25/24 0416  BP: (!) 175/88 (!) 158/84 136/80 (!) 187/79  Pulse:  73 80 73  Resp:  18 19 17   Temp:  (!) 97.5 F (36.4 C) 97.7 F (36.5 C) 97.6 F (36.4 C)  TempSrc:  Oral Oral Oral  SpO2:  100% 98% 96%  Weight:      Height:         Body mass index is 27.63 kg/m.  Physical Exam   Constitutional: Well-developed elderly patient with some signs of muscle wasting, including temporal muscle wasting Psych: Affect appropriate to situation.  Eyes: No scleral injection.  HENT: No OP obstrucion.  Head: Normocephalic.  Respiratory: Effort normal, non-labored breathing on room air Skin: Skin tear to right lower extremity  Neurologic Examination    NEURO:  Mental Status: Patient is disoriented to person, situation, time, place. States that his wife is his wife but can not state her name. Easily frustrated throughout ecam.  Speech/Language: speech is with mild dysarthria and expressive as well as receptive aphasia  Cranial Nerves:  II: PERRL. III, IV, VI: Tracks examiner around the room VII: Face is symmetrical resting and speaking VIII: hearing intact to voice. IX, X: Voice is slightly dysarthric Motor: Able to move all 4 extremities with antigravity strength Tone: is normal and bulk is normal Sensation- Intact to light touch bilaterally.   Gait- deferred   Medications  Current Facility-Administered Medications:    (feeding supplement) PROSource Plus liquid 30 mL, 30 mL, Oral, BID, Smith, Rondell A, MD, 30 mL at 08/24/24 2336   acetaminophen  (TYLENOL ) tablet 650 mg, 650 mg, Oral, Q6H  PRN, 650 mg at 08/23/24 2328 **OR** acetaminophen  (TYLENOL ) suppository 650 mg, 650 mg, Rectal, Q6H PRN, Claudene, Rondell A, MD   amLODipine  (NORVASC ) tablet 2.5 mg, 2.5 mg, Oral, Daily, Smith, Rondell A, MD, 2.5 mg at 08/24/24 1000   aspirin  tablet 325 mg, 325 mg, Oral, Daily, Smith, Rondell A, MD, 325 mg at 08/24/24 1000   atorvastatin  (LIPITOR) tablet 20 mg, 20 mg, Oral, q1800, Smith, Rondell A, MD, 20 mg at 08/24/24 1727   carvedilol  (COREG ) tablet 25 mg, 25 mg, Oral, BID, Smith, Rondell A, MD, 25 mg at 08/24/24 2335   Chlorhexidine  Gluconate Cloth 2 % PADS 6 each, 6 each, Topical, Q0600, Melia Lynwood ORN, MD, 6 each at 08/25/24 [phone]   ezetimibe  (ZETIA ) tablet 10 mg, 10 mg, Oral, Daily, Smith, Rondell A, MD, 10 mg at 08/24/24 1000   ferrous sulfate  tablet 325 mg, 325 mg, Oral, Q breakfast, Smith, Rondell A, MD, 325 mg at 08/24/24 0800   glucagon  (human recombinant) (GLUCAGEN) injection 1 mg, 1 mg, Intravenous, PRN, Amin, Ankit C, MD   guaiFENesin  (ROBITUSSIN) 100 MG/5ML liquid 5 mL, 5 mL, Oral, Q4H PRN, Amin, Ankit C, MD   heparin  injection 5,000 Units, 5,000 Units, Subcutaneous, Q8H, Smith, Rondell A, MD, 5,000 Units at 08/25/24 9380   hydrALAZINE  (APRESOLINE ) injection 10 mg, 10 mg, Intravenous, Q4H PRN, Amin, Ankit C, MD   hydrALAZINE  (APRESOLINE ) tablet 25 mg, 25 mg, Oral, TID, Claudene, Rondell A, MD, 25 mg at 08/24/24 2334  Influenza vac split trivalent PF (FLUZONE HIGH-DOSE) injection 0.5 mL, 0.5 mL, Intramuscular, Tomorrow-1000, Smith, Rondell A, MD   insulin  aspart (novoLOG ) injection 0-6 Units, 0-6 Units, Subcutaneous, TID WC, Smith, Rondell A, MD   insulin  glargine (LANTUS ) injection 8 Units, 8 Units, Subcutaneous, QHS, Smith, Rondell A, MD, 8 Units at 08/24/24 2339   ipratropium-albuterol  (DUONEB) 0.5-2.5 (3) MG/3ML nebulizer solution 3 mL, 3 mL, Nebulization, Q4H PRN, Amin, Ankit C, MD   levothyroxine  (SYNTHROID ) tablet 50 mcg, 50 mcg, Oral, QAC breakfast, Claudene, Rondell A, MD, 50 mcg at  08/24/24 0700   metoprolol  tartrate (LOPRESSOR ) injection 5 mg, 5 mg, Intravenous, Q4H PRN, Amin, Ankit C, MD   multivitamin with minerals tablet 1 tablet, 1 tablet, Oral, Daily, Claudene, Rondell A, MD, 1 tablet at 08/24/24 1000   ondansetron  (ZOFRAN ) tablet 4 mg, 4 mg, Oral, Q6H PRN **OR** ondansetron  (ZOFRAN ) injection 4 mg, 4 mg, Intravenous, Q6H PRN, Claudene, Rondell A, MD   polyethylene glycol (MIRALAX  / GLYCOLAX ) packet 17 g, 17 g, Oral, Daily, Smith, Rondell A, MD, 17 g at 08/24/24 1000   senna-docusate (Senokot-S) tablet 1 tablet, 1 tablet, Oral, QHS PRN, Amin, Ankit C, MD   senna-docusate (Senokot-S) tablet 2 tablet, 2 tablet, Oral, Daily, Claudene, Rondell A, MD, 2 tablet at 08/24/24 1000   sodium bicarbonate  tablet 650 mg, 650 mg, Oral, TID, Claudene Reeves A, MD, 650 mg at 08/24/24 2335   sodium chloride  flush (NS) 0.9 % injection 10-40 mL, 10-40 mL, Intracatheter, Q12H, Amin, Ankit C, MD, 10 mL at 08/24/24 1000   sodium chloride  flush (NS) 0.9 % injection 10-40 mL, 10-40 mL, Intracatheter, PRN, Amin, Ankit C, MD   sodium chloride  flush (NS) 0.9 % injection 3 mL, 3 mL, Intravenous, Q12H, Smith, Rondell A, MD, 3 mL at 08/24/24 1000   thiamine  (VITAMIN B1) 500 mg in sodium chloride  0.9 % 50 mL IVPB, 500 mg, Intravenous, Q24H, Amin, Ankit C, MD, Last Rate: 110 mL/hr at 08/24/24 1345, 500 mg at 08/24/24 1345  Labs and Diagnostic Imaging   CBC:  Recent Labs  Lab 08/22/24 2314 08/24/24 0230 08/25/24 0347  WBC 8.7 8.8 9.2  NEUTROABS 7.3  --   --   HGB 8.8*  9.5* 9.0* 8.9*  HCT 27.9*  28.0* 28.2* 27.7*  MCV 92.1 91.0 90.5  PLT 159 161 158    Basic Metabolic Panel:  Lab Results  Component Value Date   NA 137 08/25/2024   K 3.5 08/25/2024   CO2 28 08/25/2024   GLUCOSE 86 08/25/2024   BUN 46 (H) 08/25/2024   CREATININE 3.56 (H) 08/25/2024   CALCIUM  8.3 (L) 08/25/2024   GFRNONAA 17 (L) 08/25/2024   GFRAA 48 (L) 07/03/2020   Lipid Panel: No results found for: LDLCALC HgbA1c:   Lab Results  Component Value Date   HGBA1C 6.0 (H) 08/23/2024   Urine Drug Screen: No results found for: LABOPIA, COCAINSCRNUR, LABBENZ, AMPHETMU, THCU, LABBARB  Alcohol Level     Component Value Date/Time   ETH <15 08/22/2024 2314   INR  Lab Results  Component Value Date   INR 1.2 08/22/2024   APTT  Lab Results  Component Value Date   APTT 28 08/22/2024   Thiamine  pending B12 758 Folate 11.0 TSH 3.445 RPR reactive Treponema palladium antibodies pending Homocystine pending  CT Head without contrast(Personally reviewed): No acute abnormality, atrophy with chronic small vessel ischemic disease and remote left MCA territory infarct  MRI Brain(Personally reviewed): No acute abnormality, chronic small vessel ischemic  disease, left MCA territory chronic infarct and scattered microhemorrhages  rEEG:  - Continuous slow, left centro- parietal region This study is suggestive of cortical dysfunction arising from left centro- parietal region likely secondary to underlying structural abnormality. No seizures or definite epileptiform discharges were seen throughout the recording.  Continuous EEG: This study is suggestive of cortical dysfunction arising from left centro- parietal region likely secondary to underlying structural abnormality. Additionally there is mild diffuse encephalopathy. No seizures or definite epileptiform discharges were seen throughout the recording.   Assessment   Robert Spears is a 72 y.o. male with history of hypertension, diabetes, hyperlipidemia, CAD status post CABG, left occipital IPH in 2021, CAA, left parietal lobe infarct in 2021, A-fib status post Watchman device not on anticoagulation, left corona radiata stroke and end-stage renal disease on dialysis with recent admission for CHF and AKI who presents from his facility after being found unresponsive after a fall.  Patient was found in his rehab facility on the floor by his bed after an  unwitnessed fall.  Staff placed him in a chair and then 15 minutes later noted him to be unresponsive.  Patient's family reports that his mental status has not come back to baseline and states that before his hospitalization 6 weeks ago, he was mowing the lawn and was independent with all ADLs.  Patient's family reports that he has never had a seizure, that no other family members have seizures of that he has never had a serious head injury, meningitis or encephalitis.  However, given his left parietal stroke, he is at risk for seizure activity.  Family also reports that he had a similar episode of unresponsiveness while he was at the hospital in [facility].  Routine EEG demonstrated no seizures, but will place on LTM EEG given continued confusion.  MRI reveals no acute abnormality.  Given muscle wasting seen on exam and reports from family of poor appetite lately, will send nutritional labs and begin empiric thiamine  supplementation.  RPR was noted to be reactive, awaiting result of treponema palladium antibody lab. Appears to have a component of rhospital acquired delirium as well.   Recommendations  - Discontinue LTM - Start Keppra  250mg  BID today with 250mg  extra on dialysis days - Follow-up with thiamine , homocystine and treponema palladium antibodies - Continue empiric thiamine  supplementation - Neurology will continue to follow - treponemal Ab is pending. ______________________________________________________________________  Patient seen and examined by NP/APP with MD. MD to update note as needed.   Jorene Last, DNP, FNP-BC [facility] Pager: [phone]   NEUROHOSPITALIST ADDENDUM Performed a face to face diagnostic evaluation.   I have reviewed the contents of history and physical exam as documented by PA/ARNP/Resident and agree with above documentation.  I have discussed and formulated the above plan as documented. Edits to the note have been made as needed.  Dlisa Barnwell, MD [facility] [phone]   If 7pm to 7am, please call on call as listed on AMION.

## 2024-08-25 NOTE — Plan of Care (Signed)

## 2024-08-25 NOTE — Progress Notes (Signed)
 Received patient in bed.Alert and oriented x 2.Consent verified.  Access used: Right hd catheter that worked well.Dressing change done today.  Duration of treatment 3.75 hours.  UIf goal: Met  3 liters,tolerated treatment.  Hand off to the patient's nurse,back into his room with stable condition via transporter.

## 2024-08-25 NOTE — Progress Notes (Signed)
 LTM maint complete - no skin breakdown seen. [facility] monitored, Event button test confirmed by [facility].

## 2024-08-26 DIAGNOSIS — G9340 Encephalopathy, unspecified: Secondary | ICD-10-CM | POA: Diagnosis not present

## 2024-08-26 LAB — HEPATITIS B SURFACE ANTIBODY, QUANTITATIVE: Hep B S AB Quant (Post): <3.5 m[IU]/mL — ABNORMAL LOW

## 2024-08-26 LAB — BASIC METABOLIC PANEL WITH GFR
Anion gap: 19 — ABNORMAL HIGH (ref 5–15)
BUN: 34 mg/dL — ABNORMAL HIGH (ref 8–23)
CO2: 24 mmol/L (ref 22–32)
Calcium: 8 mg/dL — ABNORMAL LOW (ref 8.9–10.3)
Chloride: 94 mmol/L — ABNORMAL LOW (ref 98–111)
Creatinine, Ser: 2.75 mg/dL — ABNORMAL HIGH (ref 0.61–1.24)
GFR, Estimated: 23 mL/min — ABNORMAL LOW (ref >60)
Glucose, Bld: 95 mg/dL (ref 70–99)
Potassium: 3.5 mmol/L (ref 3.5–5.1)
Sodium: 137 mmol/L (ref 135–145)

## 2024-08-26 LAB — CBC
HCT: 27.2 % — ABNORMAL LOW (ref 39.0–52.0)
Hemoglobin: 8.9 g/dL — ABNORMAL LOW (ref 13.0–17.0)
MCH: 29.7 pg (ref 26.0–34.0)
MCHC: 32.7 g/dL (ref 30.0–36.0)
MCV: 90.7 fL (ref 80.0–100.0)
Platelets: 171 K/uL (ref 150–400)
RBC: 3 MIL/uL — ABNORMAL LOW (ref 4.22–5.81)
RDW: 17.4 % — ABNORMAL HIGH (ref 11.5–15.5)
WBC: 8.5 K/uL (ref 4.0–10.5)
nRBC: 0 % (ref 0.0–0.2)

## 2024-08-26 LAB — GLUCOSE, CAPILLARY
Glucose-Capillary: 92 mg/dL (ref 70–99)
Glucose-Capillary: 100 mg/dL — ABNORMAL HIGH (ref 70–99)
Glucose-Capillary: 116 mg/dL — ABNORMAL HIGH (ref 70–99)
Glucose-Capillary: 132 mg/dL — ABNORMAL HIGH (ref 70–99)

## 2024-08-26 LAB — VITAMIN B1: Vitamin B1 (Thiamine): 209.8 nmol/L — ABNORMAL HIGH (ref 66.5–200.0)

## 2024-08-26 LAB — MAGNESIUM: Magnesium: 1.9 mg/dL (ref 1.7–2.4)

## 2024-08-26 LAB — HOMOCYSTEINE: Homocysteine: 20.5 umol/L — ABNORMAL HIGH (ref 0.0–19.2)

## 2024-08-26 MED ORDER — AMLODIPINE BESYLATE 5 MG PO TABS
5.0000 mg | ORAL_TABLET | Freq: Every day | ORAL | Status: DC
  Administered 2024-08-27 – 2024-09-02 (×7): 5 mg via ORAL
  Filled 2024-08-26 (×7): qty 1

## 2024-08-26 MED ORDER — CHLORHEXIDINE GLUCONATE CLOTH 2 % EX PADS
6.0000 | MEDICATED_PAD | Freq: Every day | CUTANEOUS | Status: DC
  Administered 2024-08-27 – 2024-08-28 (×2): 6 via TOPICAL

## 2024-08-26 MED ORDER — LEVETIRACETAM (KEPPRA) 500 MG/5 ML ADULT IV PUSH
500.0000 mg | INTRAVENOUS | Status: AC
  Administered 2024-08-26: 500 mg via INTRAVENOUS
  Filled 2024-08-26: qty 5

## 2024-08-26 MED ORDER — LEVETIRACETAM (KEPPRA) 500 MG/5 ML ADULT IV PUSH
250.0000 mg | Freq: Two times a day (BID) | INTRAVENOUS | Status: DC
  Administered 2024-08-26 – 2024-08-28 (×3): 250 mg via INTRAVENOUS
  Filled 2024-08-26 (×4): qty 5

## 2024-08-26 MED ORDER — LEVETIRACETAM (KEPPRA) 500 MG/5 ML ADULT IV PUSH: 250.0000 mg | INTRAVENOUS | Status: DC | PRN

## 2024-08-26 NOTE — Progress Notes (Signed)
 Clinic contacted to be advised that pt may return to snf this weekend if pt deemed stable for d/c and insurance shara is received for snf return. Will contact clinic on Monday morning as to if pt was d/c. Will assist as needed.   Randine Mungo Dialysis Navigator [phone]

## 2024-08-26 NOTE — Plan of Care (Signed)

## 2024-08-26 NOTE — Progress Notes (Signed)
 PROGRESS NOTE    Robert Spears  FMW:[id number] DOB: 1950-12-31 DOA: 08/22/2024 PCP: Ofilia Lamar CROME, MD    Brief Narrative:  74 year old with history of HTN, HLD, A-fib status post LAAO with watchman's device in 2021, CAD status post CABG, DM2, ICH 2021, cerebral amyloid angiopathy, CVA, hypothyroidism, chronic back pain comes to the hospital for being unresponsive for several minutes followed by right arm weakness.  Initially code stroke was called and neurology was consulted.  CT of the head showed large left-sided remote MCA territory CVA, MRI brain negative and EEG showed slow cortical dysfunction of the left central parietal region.   Assessment & Plan:   Fall with subsequent episode of unresponsiveness Acute encephalopathy Concerns of possible seizure activity.Initially code stroke was called and neurology was consulted.  CT of the head showed large left-sided remote MCA territory CVA, MRI brain negative and EEG showed slow cortical dysfunction of the left central parietal region. Neurology started Keppra  and confirmed no need for LP at this time.  -Folate, B12, TSH, UA ammonia are normal  thiamine  500mg  x 5 days DC Zyprexa  for now.   RPR, reactive -Titer are 1:1. treponemal antibody test is negative.  This is likely false positive   Uncontrolled diabetes mellitus type 2 with hyperglycemia Uncontrolled, A1c 6.0.  Continue sliding scale and Accu-Cheks   ESRD on HD Nephrology following.   Heart failure with preserved ejection fraction, EF 55% Stable.  Recent outpatient echo on 8/1 shows EF of 55%    Hypokalemia As needed repletion   Permanent atrial fibrillation  Currently rate controlled. S/p Watchman device On Coreg  and aspirin    Anemia of chronic disease Hemoglobin 8.4. - Continue to monitor   CAD s/p CABG - Continue aspirin , statin, beta-blocker   Hypothyroidism - Continue levothyroxine    DVT prophylaxis: Heparin  Advance Care Planning:   Code Status:  Full Code     Consults: Neurology and nephrology   Status is: Inpatient Remains inpatient appropriate because: on going eval for weakness.    PT Follow up Recs: Skilled Nursing-Short Term Rehab (<3 Hours/Day)08/24/2024 1300. TOC consulted.   Subjective:  Answers all the basic questions. Sluggish in response.   Examination:  General exam: Appears calm and comfortable ; sluggish Respiratory system: Clear to auscultation. Respiratory effort normal. Cardiovascular system: S1 & S2 heard, RRR. No JVD, murmurs, rubs, gallops or clicks. No pedal edema. Gastrointestinal system: Abdomen is nondistended, soft and nontender. No organomegaly or masses felt. Normal bowel sounds heard. Central nervous system: Alert and oriented. No focal neurological deficits. Extremities: Symmetric 4 x 5 power. Skin: No rashes, lesions or ulcers Psychiatry: Judgement and insight appear poor                Diet Orders (From admission, onward)     Start     Ordered   08/23/24 0934  Diet heart healthy/carb modified Room service appropriate? Yes; Fluid consistency: Thin  Diet effective now       Question Answer Comment  Diet-HS Snack? Nothing   Room service appropriate? Yes   Fluid consistency: Thin      08/23/24 0947            Objective: Vitals:   08/25/24 1553 08/25/24 1953 08/26/24 0544 08/26/24 0834  BP: (!) 153/79 (!) 155/69 (!) 171/84 (!) 173/92  Pulse: 77 84 76 76  Resp: 18 17 17 20   Temp: 97.9 F (36.6 C) 98 F (36.7 C) (!) 97.4 F (36.3 C) 98.2 F (36.8 C)  TempSrc:  Oral  Oral  SpO2: 93% 93% 97% 92%  Weight:      Height:        Intake/Output Summary (Last 24 hours) at 08/26/2024 1033 Last data filed at 08/26/2024 0546 Gross per 24 hour  Intake --  Output 3300 ml  Net -3300 ml   Filed Weights   08/22/24 2350 08/25/24 1051 08/25/24 1520  Weight: 95 kg 93.5 kg 90.5 kg    Scheduled Meds:  (feeding supplement) PROSource Plus  30 mL Oral BID   amLODipine   2.5 mg  Oral Daily   aspirin   325 mg Oral Daily   atorvastatin   20 mg Oral q1800   carvedilol   25 mg Oral BID   Chlorhexidine  Gluconate Cloth  6 each Topical Q0600   ezetimibe   10 mg Oral Daily   ferrous sulfate   325 mg Oral Q breakfast   heparin   5,000 Units Subcutaneous Q8H   hydrALAZINE   25 mg Oral TID   Influenza vac split trivalent PF  0.5 mL Intramuscular Tomorrow-1000   insulin  aspart  0-6 Units Subcutaneous TID WC   insulin  glargine  8 Units Subcutaneous QHS   levETIRAcetam   250 mg Intravenous Q12H   levothyroxine   50 mcg Oral QAC breakfast   multivitamin with minerals  1 tablet Oral Daily   pantoprazole   40 mg Oral Daily   polyethylene glycol  17 g Oral Daily   senna-docusate  2 tablet Oral Daily   sodium bicarbonate   650 mg Oral TID   sodium chloride  flush  10-40 mL Intracatheter Q12H   sodium chloride  flush  3 mL Intravenous Q12H   Continuous Infusions:  thiamine  (VITAMIN B1) injection 500 mg (08/24/24 1345)    Nutritional status     Body mass index is 26.32 kg/m.  Data Reviewed:   CBC: Recent Labs  Lab 08/22/24 2314 08/24/24 0230 08/25/24 0347 08/25/24 1006 08/26/24 0435  WBC 8.7 8.8 9.2 9.0 8.5  NEUTROABS 7.3  --   --   --   --   HGB 8.8*  9.5* 9.0* 8.9* 9.6* 8.9*  HCT 27.9*  28.0* 28.2* 27.7* 29.6* 27.2*  MCV 92.1 91.0 90.5 90.5 90.7  PLT 159 161 158 160 171   Basic Metabolic Panel: Recent Labs  Lab 08/24/24 0230 08/24/24 2228 08/25/24 0347 08/25/24 1006 08/26/24 0435  NA 135 135 137 136 137  K 3.1* 3.7 3.5 3.4* 3.5  CL 94* 94* 95* 96* 94*  CO2 28 32 28 25 24   GLUCOSE 142* 91 86 96 95  BUN 37* 45* 46* 50* 34*  CREATININE 3.11* 3.53* 3.56* 3.70* 2.75*  CALCIUM  8.0* 8.2* 8.3* 8.1* 8.0*  MG 1.8 2.0 2.1  --  1.9  PHOS  --   --  4.5 4.5  --    GFR: Estimated Creatinine Clearance: 26.6 mL/min (A) (by C-G formula based on SCr of 2.75 mg/dL (H)). Liver Function Tests: Recent Labs  Lab 08/22/24 2314 08/24/24 0230 08/25/24 1006  AST 17 <10*   --   ALT 14 11  --   ALKPHOS 132* 116  --   BILITOT 1.4* 1.2  --   PROT 6.3* 5.9*  --   ALBUMIN 2.8* 2.6* 2.6*   No results for input(s): LIPASE, AMYLASE in the last 168 hours. Recent Labs  Lab 08/24/24 1449  AMMONIA 20   Coagulation Profile: Recent Labs  Lab 08/22/24 2314  INR 1.2   Cardiac Enzymes: Recent Labs  Lab 08/22/24 2314  CKTOTAL 29*  BNP (last 3 results) No results for input(s): PROBNP in the last 8760 hours. HbA1C: Recent Labs    08/23/24 1503  HGBA1C 6.0*   CBG: Recent Labs  Lab 08/24/24 1947 08/25/24 0746 08/25/24 1707 08/25/24 2203 08/26/24 0836  GLUCAP 84 93 83 87 92   Lipid Profile: No results for input(s): CHOL, HDL, LDLCALC, TRIG, CHOLHDL, LDLDIRECT in the last 72 hours. Thyroid  Function Tests: No results for input(s): TSH, T4TOTAL, FREET4, T3FREE, THYROIDAB in the last 72 hours. Anemia Panel: Recent Labs    08/23/24 1044  VITAMINB12 758   Sepsis Labs: Recent Labs  Lab 08/22/24 2320  LATICACIDVEN 0.8    No results found for this or any previous visit (from the past 240 hours).       Radiology Studies: DG CHEST PORT 1 VIEW Result Date: 08/24/2024 CLINICAL DATA:  Short of breath EXAM: PORTABLE CHEST 1 VIEW COMPARISON:  08/04/2024 FINDINGS: Single frontal view of the chest demonstrates stable right internal jugular dialysis catheter. Cardiac silhouette remains enlarged. There is progressive vascular congestion and bilateral airspace disease consistent with worsening edema. Small right pleural effusion. No pneumothorax. IMPRESSION: 1. Congestive heart failure, with worsening volume status and progressive pulmonary edema. Electronically Signed   By: Ozell Daring M.D.   On: 08/24/2024 22:15           LOS: 3 days   Time spent= 35 mins    Burgess JAYSON Dare, MD [facility]  If 7PM-7AM, please contact night-coverage  08/26/2024, 10:33 AM

## 2024-08-26 NOTE — Progress Notes (Cosign Needed Addendum)
 [facility] Progress Note   Subjective:    Seen and examined patient at bedside. He reports to me being tired then goes quickly back to sleep. On RA. Not in distress. Tolerated yesterday's HD with net UF 3L. Next HD 9/13.  Objective Vitals:   08/25/24 1553 08/25/24 1953 08/26/24 0544 08/26/24 0834  BP: (!) 153/79 (!) 155/69 (!) 171/84 (!) 173/92  Pulse: 77 84 76 76  Resp: 18 17 17 20   Temp: 97.9 F (36.6 C) 98 F (36.7 C) (!) 97.4 F (36.3 C) 98.2 F (36.8 C)  TempSrc:  Oral  Oral  SpO2: 93% 93% 97% 92%  Weight:      Height:       Physical Exam General: Chronically ill-appearing, NAD Head: Sclera not icteric Lungs: Clear anteriorly; No wheeze, rales or rhonchi. Breathing is unlabored. Heart: RRR. No murmur, rubs or gallops.  Abdomen: soft and no-tender,  Lower extremities: 1+ R ankle edema; No LLE edema Neuro: Not oriented to himself, time, and location Dialysis Access: [facility]  Filed Weights   08/22/24 2350 08/25/24 1051 08/25/24 1520  Weight: 95 kg 93.5 kg 90.5 kg    Intake/Output Summary (Last 24 hours) at 08/26/2024 1151 Last data filed at 08/26/2024 0900 Gross per 24 hour  Intake 100 ml  Output 3500 ml  Net -3400 ml    Additional Objective Labs: Basic Metabolic Panel: Recent Labs  Lab 08/25/24 0347 08/25/24 1006 08/26/24 0435  NA 137 136 137  K 3.5 3.4* 3.5  CL 95* 96* 94*  CO2 28 25 24   GLUCOSE 86 96 95  BUN 46* 50* 34*  CREATININE 3.56* 3.70* 2.75*  CALCIUM  8.3* 8.1* 8.0*  PHOS 4.5 4.5  --    Liver Function Tests: Recent Labs  Lab 08/22/24 2314 08/24/24 0230 08/25/24 1006  AST 17 <10*  --   ALT 14 11  --   ALKPHOS 132* 116  --   BILITOT 1.4* 1.2  --   PROT 6.3* 5.9*  --   ALBUMIN 2.8* 2.6* 2.6*   No results for input(s): LIPASE, AMYLASE in the last 168 hours. CBC: Recent Labs  Lab 08/22/24 2314 08/24/24 0230 08/25/24 0347 08/25/24 1006 08/26/24 0435  WBC 8.7 8.8 9.2 9.0 8.5  NEUTROABS 7.3  --   --   --   --    HGB 8.8*  9.5* 9.0* 8.9* 9.6* 8.9*  HCT 27.9*  28.0* 28.2* 27.7* 29.6* 27.2*  MCV 92.1 91.0 90.5 90.5 90.7  PLT 159 161 158 160 171   Blood Culture No results found for: SDES, SPECREQUEST, CULT, REPTSTATUS  Cardiac Enzymes: Recent Labs  Lab 08/22/24 2314  CKTOTAL 29*   CBG: Recent Labs  Lab 08/25/24 0746 08/25/24 1707 08/25/24 2203 08/26/24 0836 08/26/24 1117  GLUCAP 93 83 87 92 100*   Iron Studies: No results for input(s): IRON, TIBC, TRANSFERRIN, FERRITIN in the last 72 hours. Lab Results  Component Value Date   INR 1.2 08/22/2024   Studies/Results: DG CHEST PORT 1 VIEW Result Date: 08/24/2024 CLINICAL DATA:  Short of breath EXAM: PORTABLE CHEST 1 VIEW COMPARISON:  08/04/2024 FINDINGS: Single frontal view of the chest demonstrates stable right internal jugular dialysis catheter. Cardiac silhouette remains enlarged. There is progressive vascular congestion and bilateral airspace disease consistent with worsening edema. Small right pleural effusion. No pneumothorax. IMPRESSION: 1. Congestive heart failure, with worsening volume status and progressive pulmonary edema. Electronically Signed   By: Ozell Daring M.D.   On: 08/24/2024 22:15  Medications:  thiamine  (VITAMIN B1) injection 500 mg (08/24/24 1345)    (feeding supplement) PROSource Plus  30 mL Oral BID   amLODipine   2.5 mg Oral Daily   aspirin   325 mg Oral Daily   atorvastatin   20 mg Oral q1800   carvedilol   25 mg Oral BID   Chlorhexidine  Gluconate Cloth  6 each Topical Q0600   ezetimibe   10 mg Oral Daily   ferrous sulfate   325 mg Oral Q breakfast   heparin   5,000 Units Subcutaneous Q8H   hydrALAZINE   25 mg Oral TID   Influenza vac split trivalent PF  0.5 mL Intramuscular Tomorrow-1000   insulin  aspart  0-6 Units Subcutaneous TID WC   insulin  glargine  8 Units Subcutaneous QHS   levETIRAcetam   250 mg Intravenous Q12H   levETIRAcetam   500 mg Intravenous STAT   levothyroxine   50 mcg Oral  QAC breakfast   multivitamin with minerals  1 tablet Oral Daily   pantoprazole   40 mg Oral Daily   polyethylene glycol  17 g Oral Daily   senna-docusate  2 tablet Oral Daily   sodium bicarbonate   650 mg Oral TID   sodium chloride  flush  10-40 mL Intracatheter Q12H   sodium chloride  flush  3 mL Intravenous Q12H    Dialysis Orders: MWF - [facility] 4hrs, BFR 400, DFR 600,  EDW 94kg, 2K/ 2.5Ca Heparin  No bolus ordered *Noted he was getting an Fe load in outpatient  Assessment/Plan: AMS - Neurology following; CT of the head showed large left-sided remote MCA territory CVA.MRI brain negative.  EEG showed slow cortical dysfunction of the left central parietal region. On Keppra . Neurology following. ESRD - Followed in [facility] for an AKI on MWF. 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 UOP ranging 150-300-not the best. Cr today 2.7 but I feel he has poor body mass which likely is contributing. Continue HD for now-next 9/13. Can go back to MWF schedule next week. Monitor volume, labs, and I&Os closely. Hypertension/volume - Bps up, raising Amlodipine  to 5mg  daily. Not severely overloaded. Anemia of CKD - Hgb now 8.9. Will check iron studies. Start Fe/ESA if indicated Secondary Hyperparathyroidism -  Corr Ca and phos ok Nutrition - Renal diet with fluid restriction  Charmaine Piety, NP [facility] 08/26/2024,11:51 AM  LOS: 3 days

## 2024-08-26 NOTE — Progress Notes (Signed)
 NEUROLOGY CONSULT FOLLOW UP NOTE   Date of service: August 26, 2024 [name] MRN:  [id number] DOB:  1951-01-27  Interval Hx/subjective   No family at the bedside this morning. Patient's room is dark and he is sleeping, difficulty to wake up and continues to fall asleep throughout the exam. Patient does not answer questions, does move purposefully with noxious stimuli. Gets agitated with any painful maneuvers and gets visibly angry and upset.  Vitals   Vitals:   08/25/24 1520 08/25/24 1553 08/25/24 1953 08/26/24 0544  BP:  (!) 153/79 (!) 155/69 (!) 171/84  Pulse:  77 84 76  Resp:  18 17 17   Temp:  97.9 F (36.6 C) 98 F (36.7 C) (!) 97.4 F (36.3 C)  TempSrc:   Oral   SpO2:  93% 93% 97%  Weight: 90.5 kg     Height:         Body mass index is 26.32 kg/m.  Physical Exam   Constitutional: Well-developed elderly patient with some signs of muscle wasting, including temporal muscle wasting Psych: Affect appropriate to situation.  Eyes: No scleral injection.  HENT: No OP obstrucion.  Head: Normocephalic.  Respiratory: Effort normal, non-labored breathing on room air Skin: Skin tear to right lower extremity  Neurologic Examination    NEURO:  Mental Status: Patient appears delirious.  Speech/Language: speech is with mild dysarthria and expressive as well as receptive aphasia  Cranial Nerves:  II: PERRL. III, IV, VI: Tracks examiner around the room VII: Face is symmetrical resting and speaking VIII: hearing intact to voice. IX, X: Voice is slightly dysarthric Motor: Able to move all 4 extremities with antigravity strength Tone: is normal and bulk is normal Sensation- Intact to light touch bilaterally.   Gait- deferred   Medications  Current Facility-Administered Medications:    (feeding supplement) PROSource Plus liquid 30 mL, 30 mL, Oral, BID, Smith, Rondell A, MD, 30 mL at 08/24/24 2336   acetaminophen  (TYLENOL ) tablet 650 mg, 650 mg,  Oral, Q6H PRN, 650 mg at 08/23/24 2328 **OR** acetaminophen  (TYLENOL ) suppository 650 mg, 650 mg, Rectal, Q6H PRN, Claudene Reeves A, MD   amLODipine  (NORVASC ) tablet 2.5 mg, 2.5 mg, Oral, Daily, Smith, Rondell A, MD, 2.5 mg at 08/24/24 1000   aspirin  tablet 325 mg, 325 mg, Oral, Daily, Smith, Rondell A, MD, 325 mg at 08/24/24 1000   atorvastatin  (LIPITOR) tablet 20 mg, 20 mg, Oral, q1800, Smith, Rondell A, MD, 20 mg at 08/25/24 1850   carvedilol  (COREG ) tablet 25 mg, 25 mg, Oral, BID, Smith, Rondell A, MD, 25 mg at 08/25/24 2242   Chlorhexidine  Gluconate Cloth 2 % PADS 6 each, 6 each, Topical, Q0600, Melia Lynwood ORN, MD, 6 each at 08/26/24 0544   ezetimibe  (ZETIA ) tablet 10 mg, 10 mg, Oral, Daily, Smith, Rondell A, MD, 10 mg at 08/24/24 1000   ferrous sulfate  tablet 325 mg, 325 mg, Oral, Q breakfast, Smith, Rondell A, MD, 325 mg at 08/24/24 0800   glucagon  (human recombinant) (GLUCAGEN) injection 1 mg, 1 mg, Intravenous, PRN, Amin, Ankit C, MD   guaiFENesin  (ROBITUSSIN) 100 MG/5ML liquid 5 mL, 5 mL, Oral, Q4H PRN, Amin, Ankit C, MD   heparin  injection 5,000 Units, 5,000 Units, Subcutaneous, Q8H, Smith, Rondell A, MD, 5,000 Units at 08/26/24 0544   hydrALAZINE  (APRESOLINE ) injection 10 mg, 10 mg, Intravenous, Q4H PRN, Amin, Ankit C, MD   hydrALAZINE  (APRESOLINE ) tablet 25 mg, 25 mg, Oral, TID, Claudene, Rondell A, MD, 25 mg at 08/25/24  2241   Influenza vac split trivalent PF (FLUZONE HIGH-DOSE) injection 0.5 mL, 0.5 mL, Intramuscular, Tomorrow-1000, Smith, Rondell A, MD   insulin  aspart (novoLOG ) injection 0-6 Units, 0-6 Units, Subcutaneous, TID WC, Smith, Rondell A, MD   insulin  glargine (LANTUS ) injection 8 Units, 8 Units, Subcutaneous, QHS, Smith, Rondell A, MD, 8 Units at 08/24/24 2339   ipratropium-albuterol  (DUONEB) 0.5-2.5 (3) MG/3ML nebulizer solution 3 mL, 3 mL, Nebulization, Q4H PRN, Amin, Ankit C, MD   levETIRAcetam  (KEPPRA ) undiluted injection 250 mg, 250 mg, Intravenous, Q12H, Shafer, Devon,  NP, 250 mg at 08/25/24 2247   levothyroxine  (SYNTHROID ) tablet 50 mcg, 50 mcg, Oral, QAC breakfast, Claudene Reeves A, MD, 50 mcg at 08/26/24 0544   metoprolol  tartrate (LOPRESSOR ) injection 5 mg, 5 mg, Intravenous, Q4H PRN, Amin, Ankit C, MD   multivitamin with minerals tablet 1 tablet, 1 tablet, Oral, Daily, Claudene, Rondell A, MD, 1 tablet at 08/24/24 1000   ondansetron  (ZOFRAN ) tablet 4 mg, 4 mg, Oral, Q6H PRN **OR** ondansetron  (ZOFRAN ) injection 4 mg, 4 mg, Intravenous, Q6H PRN, Smith, Rondell A, MD   pantoprazole  (PROTONIX ) EC tablet 40 mg, 40 mg, Oral, Daily, Amin, Ankit C, MD   polyethylene glycol (MIRALAX  / GLYCOLAX ) packet 17 g, 17 g, Oral, Daily, Smith, Rondell A, MD, 17 g at 08/24/24 1000   senna-docusate (Senokot-S) tablet 1 tablet, 1 tablet, Oral, QHS PRN, Amin, Ankit C, MD   senna-docusate (Senokot-S) tablet 2 tablet, 2 tablet, Oral, Daily, Claudene, Rondell A, MD, 2 tablet at 08/24/24 1000   sodium bicarbonate  tablet 650 mg, 650 mg, Oral, TID, Claudene, Rondell A, MD, 650 mg at 08/25/24 2247   sodium chloride  flush (NS) 0.9 % injection 10-40 mL, 10-40 mL, Intracatheter, Q12H, Amin, Ankit C, MD, 10 mL at 08/25/24 2247   sodium chloride  flush (NS) 0.9 % injection 10-40 mL, 10-40 mL, Intracatheter, PRN, Amin, Ankit C, MD   sodium chloride  flush (NS) 0.9 % injection 3 mL, 3 mL, Intravenous, Q12H, Smith, Rondell A, MD, 3 mL at 08/25/24 2247   thiamine  (VITAMIN B1) 500 mg in sodium chloride  0.9 % 50 mL IVPB, 500 mg, Intravenous, Q24H, Amin, Ankit C, MD, Spears Rate: 110 mL/hr at 08/24/24 1345, 500 mg at 08/24/24 1345  Labs and Diagnostic Imaging   CBC:  Recent Labs  Lab 08/22/24 2314 08/24/24 0230 08/25/24 1006 08/26/24 0435  WBC 8.7   < > 9.0 8.5  NEUTROABS 7.3  --   --   --   HGB 8.8*  9.5*   < > 9.6* 8.9*  HCT 27.9*  28.0*   < > 29.6* 27.2*  MCV 92.1   < > 90.5 90.7  PLT 159   < > 160 171   < > = values in this interval not displayed.    Basic Metabolic Panel:  Lab Results   Component Value Date   NA 137 08/26/2024   K 3.5 08/26/2024   CO2 24 08/26/2024   GLUCOSE 95 08/26/2024   BUN 34 (H) 08/26/2024   CREATININE 2.75 (H) 08/26/2024   CALCIUM  8.0 (L) 08/26/2024   GFRNONAA 23 (L) 08/26/2024   GFRAA 48 (L) 07/03/2020   Lipid Panel: No results found for: LDLCALC HgbA1c:  Lab Results  Component Value Date   HGBA1C 6.0 (H) 08/23/2024   Urine Drug Screen: No results found for: LABOPIA, COCAINSCRNUR, LABBENZ, AMPHETMU, THCU, LABBARB  Alcohol Level     Component Value Date/Time   [facility] <15 08/22/2024 2314   INR  Lab Results  Component Value Date   INR 1.2 08/22/2024   APTT  Lab Results  Component Value Date   APTT 28 08/22/2024   Thiamine  209.8 B12 758 Folate 11.0 TSH 3.445 RPR reactive Treponema palladium antibodies non reactive  Homocystine pending  CT Head without contrast(Personally reviewed): No acute abnormality, atrophy with chronic small vessel ischemic disease and remote left MCA territory infarct  MRI Brain(Personally reviewed): No acute abnormality, chronic small vessel ischemic disease, left MCA territory chronic infarct and scattered microhemorrhages  rEEG:  - Continuous slow, left centro- parietal region This study is suggestive of cortical dysfunction arising from left centro- parietal region likely secondary to underlying structural abnormality. No seizures or definite epileptiform discharges were seen throughout the recording.  Continuous EEG: This study is suggestive of cortical dysfunction arising from left centro- parietal region likely secondary to underlying structural abnormality. Additionally there is mild diffuse encephalopathy. No seizures or definite epileptiform discharges were seen throughout the recording.   Assessment   Robert Spears is a 73 y.o. male with history of hypertension, diabetes, hyperlipidemia, CAD status post CABG, left occipital IPH in 2021, CAA, left parietal lobe infarct in  2021, A-fib status post Watchman device not on anticoagulation, left corona radiata stroke and end-stage renal disease on dialysis with recent admission for CHF and AKI who presents from his facility after being found unresponsive after a fall.  Patient was found in his rehab facility on the floor by his bed after an unwitnessed fall.  Staff placed him in a chair and then 15 minutes later noted him to be unresponsive.  Patient's family reports that his mental status has not come back to baseline and states that before his hospitalization 6 weeks ago, he was mowing the lawn and was independent with all ADLs.  Patient's family reports that he has never had a seizure, that no other family members have seizures of that he has never had a serious head injury, meningitis or encephalitis.  However, given his left parietal stroke, he is at risk for seizure activity.  Family also reports that he had a similar episode of unresponsiveness while he was at the hospital in [facility].  Routine EEG demonstrated no seizures, but will place on LTM EEG given continued confusion.  MRI reveals no acute abnormality.  Given muscle wasting seen on exam and reports from family of poor appetite lately, will send nutritional labs and begin empiric thiamine  supplementation.  RPR was noted to be reactive,  treponema palladium antibody  is negative. Appears to have a component of rhospital acquired delirium as well.   Recommendations  - Continue Keppra  250mg  BID today with 250mg  extra on dialysis days - Delirium precautions - okayto dishcharge from a neuro standpoint.  ______________________________________________________________________  Patient seen and examined by NP/APP with MD. MD to update note as needed.   Robert Last, DNP, FNP-BC [facility] Pager: [phone]   NEUROHOSPITALIST ADDENDUM Performed a face to face diagnostic evaluation.   I have reviewed the contents of history and physical exam as  documented by PA/ARNP/Resident and agree with above documentation.  I have discussed and formulated the above plan as documented. Edits to the note have been made as needed.  Impression/Key exam findings/Plan: at this time, his presentation is most consistent with delirium. He is waxing and waning. The episode of fall and AMS at the time of presentation with gradual return to his baseline was most concerning for seizure. Therefore, despite negative LTM EEG, we started him on Keppra   which is dose adjusted for his HD.  RPR was positive with weak titers, T pallidum is negative and therefore, RPR was likely false positive.  Discussed with wife at the bedside and with Hospitalist Dr. Caleen, okay to discharge from a neuro standpoint with follow up with neurology outpatient.   We will signoff.  Robert Gelpi, MD [facility] [phone]   If 7pm to 7am, please call on call as listed on AMION.

## 2024-08-26 NOTE — TOC Progression Note (Addendum)
 Transition of Care [facility]) - Progression Note    Patient Details  Name: Robert Spears MRN: [id number] Date of Birth: 09-11-1951  Transition of [facility]) CM/SW Contact  Lendia Dais, [place] Phone Number: 08/26/2024, 9:00 AM  Clinical Narrative:   Pt is from [facility] and plan is to return. CSW called [name] and left a VM.  CSW spoke to [name] and stated that the patient would be able to return on the weekend as long as shara is approved.   CSW will continue to follow.    Expected Discharge Plan: Skilled Nursing Facility Barriers to Discharge: Continued Medical Work up               Expected Discharge Plan and Services In-house Referral: Clinical Social Work     Living arrangements for the past 2 months: Skilled Nursing Facility                                       Social Drivers of Health (SDOH) Interventions SDOH Screenings   Food Insecurity: Low Risk  (07/15/2024)   Received from [facility]  Housing: Low Risk  (07/15/2024)   Received from [facility]  Transportation Needs: No Transportation Needs (07/15/2024)   Received from [facility]  Utilities: Patient Unable To Answer (08/23/2024)  Financial Resource Strain: Low Risk  (10/04/2018)   Received from [facility] visits prior to 02/14/2023.  Physical Activity: Inactive (10/04/2018)   Received from [facility] visits prior to 02/14/2023.  Social Connections: Patient Unable To Answer (08/23/2024)  Stress: No Stress Concern Present (10/04/2018)   Received from [facility] visits prior to 02/14/2023.  Tobacco Use: Medium Risk (08/23/2024)    Readmission Risk Interventions     No data to display

## 2024-08-27 DIAGNOSIS — G9340 Encephalopathy, unspecified: Secondary | ICD-10-CM | POA: Diagnosis not present

## 2024-08-27 LAB — IRON AND TIBC
Iron: 55 ug/dL (ref 45–182)
Saturation Ratios: 32 % (ref 17.9–39.5)
TIBC: 171 ug/dL — ABNORMAL LOW (ref 250–450)
UIBC: 116 ug/dL

## 2024-08-27 LAB — MAGNESIUM: Magnesium: 2 mg/dL (ref 1.7–2.4)

## 2024-08-27 LAB — GLUCOSE, CAPILLARY
Glucose-Capillary: 121 mg/dL — ABNORMAL HIGH (ref 70–99)
Glucose-Capillary: 210 mg/dL — ABNORMAL HIGH (ref 70–99)
Glucose-Capillary: 163 mg/dL — ABNORMAL HIGH (ref 70–99)
Glucose-Capillary: 23 mg/dL — CL (ref 70–99)

## 2024-08-27 LAB — CBC
HCT: 28.8 % — ABNORMAL LOW (ref 39.0–52.0)
Hemoglobin: 9.4 g/dL — ABNORMAL LOW (ref 13.0–17.0)
MCH: 29.3 pg (ref 26.0–34.0)
MCHC: 32.6 g/dL (ref 30.0–36.0)
MCV: 89.7 fL (ref 80.0–100.0)
Platelets: 159 K/uL (ref 150–400)
RBC: 3.21 MIL/uL — ABNORMAL LOW (ref 4.22–5.81)
RDW: 17.4 % — ABNORMAL HIGH (ref 11.5–15.5)
WBC: 7.2 K/uL (ref 4.0–10.5)
nRBC: 0 % (ref 0.0–0.2)

## 2024-08-27 LAB — PHOSPHORUS: Phosphorus: 4 mg/dL (ref 2.5–4.6)

## 2024-08-27 LAB — FERRITIN: Ferritin: 503 ng/mL — ABNORMAL HIGH (ref 24–336)

## 2024-08-27 LAB — BASIC METABOLIC PANEL WITH GFR
Anion gap: 15 (ref 5–15)
BUN: 43 mg/dL — ABNORMAL HIGH (ref 8–23)
CO2: 26 mmol/L (ref 22–32)
Calcium: 8.3 mg/dL — ABNORMAL LOW (ref 8.9–10.3)
Chloride: 95 mmol/L — ABNORMAL LOW (ref 98–111)
Creatinine, Ser: 3.29 mg/dL — ABNORMAL HIGH (ref 0.61–1.24)
GFR, Estimated: 19 mL/min — ABNORMAL LOW (ref >60)
Glucose, Bld: 123 mg/dL — ABNORMAL HIGH (ref 70–99)
Potassium: 3.3 mmol/L — ABNORMAL LOW (ref 3.5–5.1)
Sodium: 136 mmol/L (ref 135–145)

## 2024-08-27 MED ORDER — LEVETIRACETAM (KEPPRA) 500 MG/5 ML ADULT IV PUSH
250.0000 mg | Freq: Once | INTRAVENOUS | Status: AC
  Administered 2024-08-27: 250 mg via INTRAVENOUS
  Filled 2024-08-27: qty 5

## 2024-08-27 MED ORDER — DEXTROSE 50 % IV SOLN
INTRAVENOUS | Status: AC
  Filled 2024-08-27: qty 50

## 2024-08-27 MED ORDER — LIDOCAINE-PRILOCAINE 2.5-2.5 % EX CREA: 1.0000 | TOPICAL_CREAM | CUTANEOUS | Status: DC | PRN

## 2024-08-27 MED ORDER — ALTEPLASE 2 MG IJ SOLR: 2.0000 mg | Freq: Once | INTRAMUSCULAR | Status: DC | PRN

## 2024-08-27 MED ORDER — PENTAFLUOROPROP-TETRAFLUOROETH EX AERO: 1.0000 | INHALATION_SPRAY | CUTANEOUS | Status: DC | PRN

## 2024-08-27 MED ORDER — LEVETIRACETAM (KEPPRA) 500 MG/5 ML ADULT IV PUSH: 250.0000 mg | INTRAVENOUS | Status: DC

## 2024-08-27 MED ORDER — OLANZAPINE 2.5 MG PO TABS: 2.5000 mg | ORAL_TABLET | Freq: Every evening | ORAL | 0 refills | Status: DC | PRN

## 2024-08-27 MED ORDER — LEVETIRACETAM 250 MG PO TABS: 250.0000 mg | ORAL_TABLET | ORAL | Status: DC

## 2024-08-27 MED ORDER — HEPARIN SODIUM (PORCINE) 1000 UNIT/ML DIALYSIS
1000.0000 [IU] | INTRAMUSCULAR | Status: DC | PRN
  Administered 2024-08-27: 3800 [IU] via INTRAVENOUS_CENTRAL

## 2024-08-27 MED ORDER — HEPARIN SODIUM (PORCINE) 1000 UNIT/ML IJ SOLN
INTRAMUSCULAR | Status: AC
Start: 2024-08-27 — End: 2024-08-27
  Filled 2024-08-27: qty 4

## 2024-08-27 MED ORDER — LEVETIRACETAM 250 MG PO TABS: 250.0000 mg | ORAL_TABLET | Freq: Two times a day (BID) | ORAL | Status: DC

## 2024-08-27 MED ORDER — DEXTROSE 50 % IV SOLN: 25.0000 g | INTRAVENOUS | Status: AC

## 2024-08-27 MED ORDER — VITAMIN B-1 250 MG PO TABS
500.0000 mg | ORAL_TABLET | Freq: Every day | ORAL | Status: DC
Start: 2024-08-27 — End: 2024-09-02

## 2024-08-27 MED ORDER — LIDOCAINE HCL (PF) 1 % IJ SOLN: 5.0000 mL | INTRAMUSCULAR | Status: DC | PRN

## 2024-08-27 NOTE — Progress Notes (Signed)
 Received patient in bed to unit.  Alert and oriented.  Informed consent signed and in chart.   TX duration:3 hours  Patient tolerated well.  Transported back to the room  Alert, without acute distress.  Hand-off given to patient's nurse.   Access used: R internal jugular HD Cath  Access issues: none  Total UF removed: 2L Medication(s) given: none   08/27/24 1242  Vitals  Temp (!) 97.4 F (36.3 C)  Temp Source Oral  BP 136/75  Pulse Rate (!) 58  Resp (!) 27  Oxygen Therapy  SpO2 100 %  O2 Device Room Air  During Treatment Monitoring  Blood Flow Rate (mL/min) 399 mL/min  Arterial Pressure (mmHg) -187.06 mmHg  Venous Pressure (mmHg) 158.58 mmHg  TMP (mmHg) 7.88 mmHg  Ultrafiltration Rate (mL/min) 934 mL/min  Dialysate Flow Rate (mL/min) 300 ml/min  Dialysate Potassium Concentration 3  Dialysate Calcium  Concentration 2.5  Duration of HD Treatment -hour(s) 2.95 hour(s)  Cumulative Fluid Removed (mL) per Treatment  1955.48  HD Safety Checks Performed Yes  Intra-Hemodialysis Comments Tx completed;Tolerated well  Post Treatment  Dialyzer Clearance Clear  Liters Processed 72  Fluid Removed (mL) 2 mL  Tolerated HD Treatment Yes  Hemodialysis Catheter Right Subclavian Double lumen Permanent (Tunneled)  No placement date or time found.   Placed prior to admission: Yes  Orientation: Right  Access Location: Subclavian  Hemodialysis Catheter Type: Double lumen Permanent (Tunneled)  Site Condition No complications  Blue Lumen Status Antimicrobial dead end cap;Heparin  locked;Flushed  Red Lumen Status Flushed;Antimicrobial dead end cap;Heparin  locked  Purple Lumen Status N/A  Catheter fill solution Heparin  1000 units/ml  Catheter fill volume (Arterial) 1.9 cc  Catheter fill volume (Venous) 1.9  Dressing Type Transparent  Dressing Status Antimicrobial disc/dressing in place;Clean, Dry, Intact  Drainage Description None  Dressing Change Due 09/03/24  Post treatment catheter  status Capped and Clamped     Camellia Brasil LPN [facility]

## 2024-08-27 NOTE — Progress Notes (Signed)
 PROGRESS NOTE    Robert Spears  FMW:[id number] DOB: 07/15/1951 DOA: 08/22/2024 PCP: Ofilia Lamar CROME, MD    Brief Narrative:  73 year old with history of HTN, HLD, A-fib status post LAAO with watchman's device in 2021, CAD status post CABG, DM2, ICH 2021, cerebral amyloid angiopathy, CVA, hypothyroidism, chronic back pain comes to the hospital for being unresponsive for several minutes followed by right arm weakness.  Initially code stroke was called and neurology was consulted.  CT of the head showed large left-sided remote MCA territory CVA, MRI brain negative and EEG showed slow cortical dysfunction of the left central parietal region.  Patient was started on Keppra  twice daily per neurology.  Eventually this was all thought secondary to delirium. - Will transition patient back to his facility   Assessment & Plan:   Fall with subsequent episode of unresponsiveness Acute encephalopathy Concerns of possible seizure activity.Initially code stroke was called and neurology was consulted.  CT of the head showed large left-sided remote MCA territory CVA, MRI brain negative and EEG showed slow cortical dysfunction of the left central parietal region. Neurology started Keppra  and confirmed no need for LP at this time.  -Folate, B12, TSH, UA ammonia are normal Thiamine  100 mg daily, will transition to p.o. for 2 more days Change Zyprexa  at bedtime to as needed  RPR, reactive -Titer are 1:1. treponemal antibody test is negative.  This is likely false positive   Uncontrolled diabetes mellitus type 2 with hyperglycemia Uncontrolled, A1c 6.0.  Continue sliding scale and Accu-Cheks   ESRD on HD Nephrology following.   Heart failure with preserved ejection fraction, EF 55% Stable.  Recent outpatient echo on 8/1 shows EF of 55%    Hypokalemia As needed repletion   Permanent atrial fibrillation  Currently rate controlled. S/p Watchman device On Coreg  and aspirin    Anemia of chronic  disease Hemoglobin 8.4. - Continue to monitor   CAD s/p CABG - Continue aspirin , statin, beta-blocker   Hypothyroidism - Continue levothyroxine    DVT prophylaxis: Heparin  Advance Care Planning:   Code Status: Full Code     Consults: Neurology and nephrology   Status is: Inpatient Remains inpatient appropriate because: Discharge back to his rehab   PT Follow up Recs: Skilled Nursing-Short Term Rehab (<3 Hours/Day)08/24/2024 1300. TOC consulted.   Subjective: Seen in HD, no complaints.    Examination:  General exam: Appears calm and comfortable ; sluggish Respiratory system: Clear to auscultation. Respiratory effort normal. Cardiovascular system: S1 & S2 heard, RRR. No JVD, murmurs, rubs, gallops or clicks. No pedal edema. Gastrointestinal system: Abdomen is nondistended, soft and nontender. No organomegaly or masses felt. Normal bowel sounds heard. Central nervous system: Alert and oriented to name only. No focal neurological deficits. Extremities: Symmetric 4 x 5 power. Skin: No rashes, lesions or ulcers Psychiatry: Judgement and insight appear poor                Diet Orders (From admission, onward)     Start     Ordered   08/23/24 0934  Diet heart healthy/carb modified Room service appropriate? Yes; Fluid consistency: Thin  Diet effective now       Question Answer Comment  Diet-HS Snack? Nothing   Room service appropriate? Yes   Fluid consistency: Thin      08/23/24 0947            Objective: Vitals:   08/27/24 1015 08/27/24 1030 08/27/24 1100 08/27/24 1130  BP:  128/70 137/80 130/68  Pulse:  71 80 60  Resp:  13 20 (!) 23  Temp:      TempSrc:      SpO2: 97% 98% 100% 100%  Weight:      Height:        Intake/Output Summary (Last 24 hours) at 08/27/2024 1133 Last data filed at 08/26/2024 1741 Gross per 24 hour  Intake 500 ml  Output 0 ml  Net 500 ml   Filed Weights   08/25/24 1051 08/25/24 1520 08/27/24 0923  Weight: 93.5 kg 90.5 kg  89.3 kg    Scheduled Meds:  (feeding supplement) PROSource Plus  30 mL Oral BID   amLODipine   5 mg Oral Daily   aspirin   325 mg Oral Daily   atorvastatin   20 mg Oral q1800   carvedilol   25 mg Oral BID   Chlorhexidine  Gluconate Cloth  6 each Topical Q0600   ezetimibe   10 mg Oral Daily   ferrous sulfate   325 mg Oral Q breakfast   heparin   5,000 Units Subcutaneous Q8H   hydrALAZINE   25 mg Oral TID   Influenza vac split trivalent PF  0.5 mL Intramuscular Tomorrow-1000   insulin  aspart  0-6 Units Subcutaneous TID WC   insulin  glargine  8 Units Subcutaneous QHS   levETIRAcetam   250 mg Intravenous Q12H   [START ON 08/29/2024] levETIRAcetam   250 mg Intravenous Q M,W,F-2000   levETIRAcetam   250 mg Intravenous Once   levothyroxine   50 mcg Oral QAC breakfast   multivitamin with minerals  1 tablet Oral Daily   pantoprazole   40 mg Oral Daily   polyethylene glycol  17 g Oral Daily   senna-docusate  2 tablet Oral Daily   sodium bicarbonate   650 mg Oral TID   sodium chloride  flush  10-40 mL Intracatheter Q12H   sodium chloride  flush  3 mL Intravenous Q12H   Continuous Infusions:  thiamine  (VITAMIN B1) injection 500 mg (08/26/24 1520)    Nutritional status     Body mass index is 25.97 kg/m.  Data Reviewed:   CBC: Recent Labs  Lab 08/22/24 2314 08/24/24 0230 08/25/24 0347 08/25/24 1006 08/26/24 0435 08/27/24 0533  WBC 8.7 8.8 9.2 9.0 8.5 7.2  NEUTROABS 7.3  --   --   --   --   --   HGB 8.8*  9.5* 9.0* 8.9* 9.6* 8.9* 9.4*  HCT 27.9*  28.0* 28.2* 27.7* 29.6* 27.2* 28.8*  MCV 92.1 91.0 90.5 90.5 90.7 89.7  PLT 159 161 158 160 171 159   Basic Metabolic Panel: Recent Labs  Lab 08/24/24 0230 08/24/24 2228 08/25/24 0347 08/25/24 1006 08/26/24 0435 08/27/24 0533  NA 135 135 137 136 137 136  K 3.1* 3.7 3.5 3.4* 3.5 3.3*  CL 94* 94* 95* 96* 94* 95*  CO2 28 32 28 25 24 26   GLUCOSE 142* 91 86 96 95 123*  BUN 37* 45* 46* 50* 34* 43*  CREATININE 3.11* 3.53* 3.56* 3.70* 2.75*  3.29*  CALCIUM  8.0* 8.2* 8.3* 8.1* 8.0* 8.3*  MG 1.8 2.0 2.1  --  1.9 2.0  PHOS  --   --  4.5 4.5  --  4.0   GFR: Estimated Creatinine Clearance: 22.3 mL/min (A) (by C-G formula based on SCr of 3.29 mg/dL (H)). Liver Function Tests: Recent Labs  Lab 08/22/24 2314 08/24/24 0230 08/25/24 1006  AST 17 <10*  --   ALT 14 11  --   ALKPHOS 132* 116  --   BILITOT 1.4* 1.2  --  PROT 6.3* 5.9*  --   ALBUMIN 2.8* 2.6* 2.6*   No results for input(s): LIPASE, AMYLASE in the last 168 hours. Recent Labs  Lab 08/24/24 1449  AMMONIA 20   Coagulation Profile: Recent Labs  Lab 08/22/24 2314  INR 1.2   Cardiac Enzymes: Recent Labs  Lab 08/22/24 2314  CKTOTAL 29*   BNP (last 3 results) No results for input(s): PROBNP in the last 8760 hours. HbA1C: No results for input(s): HGBA1C in the last 72 hours. CBG: Recent Labs  Lab 08/26/24 0836 08/26/24 1117 08/26/24 1641 08/26/24 2112 08/27/24 0813  GLUCAP 92 100* 116* 132* 121*   Lipid Profile: No results for input(s): CHOL, HDL, LDLCALC, TRIG, CHOLHDL, LDLDIRECT in the last 72 hours. Thyroid  Function Tests: No results for input(s): TSH, T4TOTAL, FREET4, T3FREE, THYROIDAB in the last 72 hours. Anemia Panel: Recent Labs    08/27/24 0533  FERRITIN 503*  TIBC 171*  IRON 55   Sepsis Labs: Recent Labs  Lab 08/22/24 2320  LATICACIDVEN 0.8    No results found for this or any previous visit (from the past 240 hours).       Radiology Studies: No results found.         LOS: 4 days   Time spent= 35 mins    Burgess JAYSON Dare, MD [facility]  If 7PM-7AM, please contact night-coverage  08/27/2024, 11:33 AM

## 2024-08-27 NOTE — Progress Notes (Signed)
 At 2009, was informed by NT Jaina of CBG of 23, this nurse requested to have blood sugar rechecked as patient was alert, repeat blood sugar shows 163 mg/dl taken on right hand. No further action taken, Dr. Lee updated of both results.

## 2024-08-27 NOTE — Plan of Care (Signed)
  Problem: Coping: Goal: Ability to adjust to condition or change in health will improve Outcome: Progressing   Problem: Nutritional: Goal: Progress toward achieving an optimal weight will improve Outcome: Progressing   Problem: Skin Integrity: Goal: Risk for impaired skin integrity will decrease Outcome: Progressing

## 2024-08-27 NOTE — Progress Notes (Signed)
 PT Cancellation Note  Patient Details Name: Robert Spears MRN: [id number] DOB: 1951/09/03   Cancelled Treatment:    Reason Eval/Treat Not Completed: Patient at procedure or test/unavailable (Pt off unit for hemodialysis)   Sameer Teeple J Paislee Szatkowski 08/27/2024, 11:03 AM  Toya HAMS , PTA Acute Rehabilitation Services Office [phone]

## 2024-08-27 NOTE — Progress Notes (Signed)
 [facility] Progress Note   Subjective:   Patient seen and examined at bedside.  Sleeping with mittens in place.  Does not participate in exam.   Objective Vitals:   08/26/24 2007 08/27/24 0425 08/27/24 0758 08/27/24 0923  BP: (!) 162/75 (!) 158/77 (!) 180/89 (!) 158/56  Pulse: 76 76 72 77  Resp: 19 18 20 16   Temp: 97.6 F (36.4 C) 97.8 F (36.6 C) 98.3 F (36.8 C) (!) 97.5 F (36.4 C)  TempSrc: Oral Oral    SpO2: 95% 96% 93% 94%  Weight:    89.3 kg  Height:       Physical Exam General:chronically ill appearing male, sleeping in NAD Heart:RRR, no mrg Lungs:CTAB, nml WOB on RA Abdomen:soft, non distended Extremities:trace LE edema Dialysis Access: [facility]   Filed Weights   08/25/24 1051 08/25/24 1520 08/27/24 0923  Weight: 93.5 kg 90.5 kg 89.3 kg    Intake/Output Summary (Last 24 hours) at 08/27/2024 0933 Last data filed at 08/26/2024 1741 Gross per 24 hour  Intake 500 ml  Output 0 ml  Net 500 ml    Additional Objective Labs: Basic Metabolic Panel: Recent Labs  Lab 08/25/24 0347 08/25/24 1006 08/26/24 0435 08/27/24 0533  NA 137 136 137 136  K 3.5 3.4* 3.5 3.3*  CL 95* 96* 94* 95*  CO2 28 25 24 26   GLUCOSE 86 96 95 123*  BUN 46* 50* 34* 43*  CREATININE 3.56* 3.70* 2.75* 3.29*  CALCIUM  8.3* 8.1* 8.0* 8.3*  PHOS 4.5 4.5  --  4.0   Liver Function Tests: Recent Labs  Lab 08/22/24 2314 08/24/24 0230 08/25/24 1006  AST 17 <10*  --   ALT 14 11  --   ALKPHOS 132* 116  --   BILITOT 1.4* 1.2  --   PROT 6.3* 5.9*  --   ALBUMIN 2.8* 2.6* 2.6*   No results for input(s): LIPASE, AMYLASE in the last 168 hours. CBC: Recent Labs  Lab 08/22/24 2314 08/24/24 0230 08/25/24 0347 08/25/24 1006 08/26/24 0435 08/27/24 0533  WBC 8.7 8.8 9.2 9.0 8.5 7.2  NEUTROABS 7.3  --   --   --   --   --   HGB 8.8*  9.5* 9.0* 8.9* 9.6* 8.9* 9.4*  HCT 27.9*  28.0* 28.2* 27.7* 29.6* 27.2* 28.8*  MCV 92.1 91.0 90.5 90.5 90.7 89.7  PLT 159 161 158 160 171  159   Blood Culture No results found for: SDES, SPECREQUEST, CULT, REPTSTATUS  Cardiac Enzymes: Recent Labs  Lab 08/22/24 2314  CKTOTAL 29*   CBG: Recent Labs  Lab 08/26/24 0836 08/26/24 1117 08/26/24 1641 08/26/24 2112 08/27/24 0813  GLUCAP 92 100* 116* 132* 121*   Iron Studies:  Recent Labs    08/27/24 0533  IRON 55  TIBC 171*  FERRITIN 503*   Lab Results  Component Value Date   INR 1.2 08/22/2024   Studies/Results: No results found.  Medications:  thiamine  (VITAMIN B1) injection 500 mg (08/26/24 1520)    (feeding supplement) PROSource Plus  30 mL Oral BID   amLODipine   5 mg Oral Daily   aspirin   325 mg Oral Daily   atorvastatin   20 mg Oral q1800   carvedilol   25 mg Oral BID   Chlorhexidine  Gluconate Cloth  6 each Topical Q0600   ezetimibe   10 mg Oral Daily   ferrous sulfate   325 mg Oral Q breakfast   heparin   5,000 Units Subcutaneous Q8H   hydrALAZINE   25 mg Oral  TID   Influenza vac split trivalent PF  0.5 mL Intramuscular Tomorrow-1000   insulin  aspart  0-6 Units Subcutaneous TID WC   insulin  glargine  8 Units Subcutaneous QHS   levETIRAcetam   250 mg Intravenous Q12H   [START ON 08/29/2024] levETIRAcetam   250 mg Intravenous Q M,W,F-2000   levETIRAcetam   250 mg Intravenous Once   levothyroxine   50 mcg Oral QAC breakfast   multivitamin with minerals  1 tablet Oral Daily   pantoprazole   40 mg Oral Daily   polyethylene glycol  17 g Oral Daily   senna-docusate  2 tablet Oral Daily   sodium bicarbonate   650 mg Oral TID   sodium chloride  flush  10-40 mL Intracatheter Q12H   sodium chloride  flush  3 mL Intravenous Q12H    Dialysis Orders: MWF - [facility] 4hrs, BFR 400, DFR 600,  EDW 94kg, 2K/ 2.5Ca Heparin  No bolus ordered *Noted he was getting an Fe load in outpatient   Assessment/Plan: AMS - Neurology following; CT of the head showed large left-sided remote MCA territory CVA. MRI brain negative.  EEG showed slow cortical  dysfunction of the left central parietal region. On Keppra . Neurology following. ESRD - Followed in [facility] for an AKI on MWF. 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 UOP ranging 150-300-not the best. Cr today 3.29 but I feel he has low muscle mass which likely is contributing. Continue HD for now-next today.  Plan to resume MWF schedule next week. Monitor volume, labs, and I&Os closely. Hypertension/volume - BP elevated, amlodipine  increased to 5mg  this AM. Some LE edema on exam, continue UF as tolerated.  Anemia of CKD - Hgb improved to 9.4.  Tsat 32%.  No indication for IV iron.  Secondary Hyperparathyroidism -  Corr Ca and phos ok Nutrition - Renal diet with fluid restriction  Manuelita Labella, PA-C [facility] 08/27/2024,9:33 AM  LOS: 4 days

## 2024-08-27 NOTE — TOC Progression Note (Signed)
 Transition of Care [facility]) - Progression Note    Patient Details  Name: Robert Spears MRN: [id number] Date of Birth: [birth year]  Transition of Care [facility]) CM/SW Contact  Gwenn Frieze [place], [place] Phone Number: 08/27/2024, 11:06 AM  Clinical Narrative:  Per MD, pt medically stable for dc to SNF. Per weekday SW note, pt able to return to [name] pending new auth. Spoke to [place] at [facility] who reports shara has not been submitted. Requested auth for [name] and SNF today. MD aware of barrier to dc.   Frieze Gwenn, MSW, LCSW [phone] (coverage)       Expected Discharge Plan: Skilled Nursing Facility Barriers to Discharge: Continued Medical Work up               Expected Discharge Plan and Services In-house Referral: Clinical Social Work     Living arrangements for the past 2 months: Skilled Nursing Facility                                       Social Drivers of Health (SDOH) Interventions SDOH Screenings   Food Insecurity: Low Risk  (07/15/2024)   Received from [facility]  Housing: Low Risk  (07/15/2024)   Received from [facility]  Transportation Needs: No Transportation Needs (07/15/2024)   Received from [facility]  Utilities: Patient Unable To Answer (08/23/2024)  Financial Resource Strain: Low Risk  (10/04/2018)   Received from [facility] visits prior to 02/14/2023.  Physical Activity: Inactive (10/04/2018)   Received from [facility] visits prior to 02/14/2023.  Social Connections: Patient Unable To Answer (08/23/2024)  Stress: No Stress Concern Present (10/04/2018)   Received from [facility] visits prior to 02/14/2023.  Tobacco Use: Medium Risk (08/23/2024)    Readmission Risk Interventions     No data to display

## 2024-08-27 NOTE — Progress Notes (Signed)
 A consult was placed to IV Therapy for new iv access; pt getting IV Thiamine  daily;  staff is here to take pt to dialysis; secure chat sent to nurse;  if pt cannot receive thiamine  in HD, nurse will enter a new consult for IV Therapy.

## 2024-08-28 ENCOUNTER — Inpatient Hospital Stay (HOSPITAL_COMMUNITY)
Admit: 2024-08-28 | Discharge: 2024-08-28 | Disposition: A | Discharge status: Home / Self Care | Attending: Neurology | Admitting: Neurology

## 2024-08-28 ENCOUNTER — Inpatient Hospital Stay (HOSPITAL_COMMUNITY)

## 2024-08-28 DIAGNOSIS — G9340 Encephalopathy, unspecified: Secondary | ICD-10-CM | POA: Diagnosis not present

## 2024-08-28 LAB — CBC
HCT: 27.4 % — ABNORMAL LOW (ref 39.0–52.0)
Hemoglobin: 8.9 g/dL — ABNORMAL LOW (ref 13.0–17.0)
MCH: 29.2 pg (ref 26.0–34.0)
MCHC: 32.5 g/dL (ref 30.0–36.0)
MCV: 89.8 fL (ref 80.0–100.0)
Platelets: 165 K/uL (ref 150–400)
RBC: 3.05 MIL/uL — ABNORMAL LOW (ref 4.22–5.81)
RDW: 17.4 % — ABNORMAL HIGH (ref 11.5–15.5)
WBC: 7.4 K/uL (ref 4.0–10.5)
nRBC: 0 % (ref 0.0–0.2)

## 2024-08-28 LAB — BASIC METABOLIC PANEL WITH GFR
Anion gap: 11 (ref 5–15)
BUN: 33 mg/dL — ABNORMAL HIGH (ref 8–23)
CO2: 29 mmol/L (ref 22–32)
Calcium: 8.1 mg/dL — ABNORMAL LOW (ref 8.9–10.3)
Chloride: 95 mmol/L — ABNORMAL LOW (ref 98–111)
Creatinine, Ser: 2.64 mg/dL — ABNORMAL HIGH (ref 0.61–1.24)
GFR, Estimated: 25 mL/min — ABNORMAL LOW (ref >60)
Glucose, Bld: 156 mg/dL — ABNORMAL HIGH (ref 70–99)
Potassium: 3.6 mmol/L (ref 3.5–5.1)
Sodium: 135 mmol/L (ref 135–145)

## 2024-08-28 LAB — GLUCOSE, CAPILLARY
Glucose-Capillary: 121 mg/dL — ABNORMAL HIGH (ref 70–99)
Glucose-Capillary: 118 mg/dL — ABNORMAL HIGH (ref 70–99)
Glucose-Capillary: 87 mg/dL (ref 70–99)
Glucose-Capillary: 93 mg/dL (ref 70–99)

## 2024-08-28 LAB — MAGNESIUM: Magnesium: 2 mg/dL (ref 1.7–2.4)

## 2024-08-28 MED ORDER — CHLORHEXIDINE GLUCONATE CLOTH 2 % EX PADS
6.0000 | MEDICATED_PAD | Freq: Every day | CUTANEOUS | Status: DC
  Administered 2024-08-29 – 2024-09-02 (×5): 6 via TOPICAL

## 2024-08-28 MED ORDER — THIAMINE MONONITRATE 100 MG PO TABS
100.0000 mg | ORAL_TABLET | Freq: Every day | ORAL | Status: DC
  Administered 2024-08-30 – 2024-09-02 (×4): 100 mg via ORAL
  Filled 2024-08-28 (×4): qty 1

## 2024-08-28 NOTE — Progress Notes (Signed)
 PROGRESS NOTE    Robert Spears  FMW:[id number] DOB: Feb 01, 1951 DOA: 08/22/2024 PCP: Ofilia Lamar CROME, MD    Brief Narrative:  73 year old with history of HTN, HLD, A-fib status post LAAO with watchman's device in 2021, CAD status post CABG, DM2, ICH 2021, cerebral amyloid angiopathy, CVA, hypothyroidism, chronic back pain comes to the hospital for being unresponsive for several minutes followed by right arm weakness.  Initially code stroke was called and neurology was consulted.  CT of the head showed large left-sided remote MCA territory CVA, MRI brain negative and EEG showed slow cortical dysfunction of the left central parietal region.  Patient was started on Keppra  twice daily per neurology.  Eventually this was all thought secondary to delirium. - Will transition patient back to his facility   Assessment & Plan:   Fall with subsequent episode of unresponsiveness Acute encephalopathy Concerns of possible seizure activity.Initially code stroke was called and neurology was consulted.  CT of the head showed large left-sided remote MCA territory CVA, MRI brain negative and EEG showed slow cortical dysfunction of the left central parietal region. Neurology started Keppra  and confirmed no need for LP at this time.  -Folate, B12, TSH, UA ammonia are normal - Received high-dose thiamine , transition to 100 mg daily starting 9/16 Change Zyprexa  at bedtime to as needed  RPR, reactive -Titer are 1:1. treponemal antibody test is negative.  This is likely false positive   Uncontrolled diabetes mellitus type 2 with hyperglycemia Uncontrolled, A1c 6.0.  Continue sliding scale and Accu-Cheks   ESRD on HD Nephrology following.   Heart failure with preserved ejection fraction, EF 55% Stable.  Recent outpatient echo on 8/1 shows EF of 55%    Hypokalemia As needed repletion   Permanent atrial fibrillation  Currently rate controlled. S/p Watchman device On Coreg  and aspirin    Anemia of  chronic disease Hemoglobin 8.4. - Continue to monitor   CAD s/p CABG - Continue aspirin , statin, beta-blocker   Hypothyroidism - Continue levothyroxine    DVT prophylaxis: Heparin  Advance Care Planning:   Code Status: Full Code     Consults: Neurology and nephrology   Status is: Inpatient Remains inpatient appropriate because: Discharge back to his rehab   PT Follow up Recs: Skilled Nursing-Short Term Rehab (<3 Hours/Day)08/24/2024 1300. TOC consulted.   Subjective: Resting comfortably this morning.  No new complaints   Examination:  General exam: Appears calm and comfortable ; sluggish Respiratory system: Clear to auscultation. Respiratory effort normal. Cardiovascular system: S1 & S2 heard, RRR. No JVD, murmurs, rubs, gallops or clicks. No pedal edema. Gastrointestinal system: Abdomen is nondistended, soft and nontender. No organomegaly or masses felt. Normal bowel sounds heard. Central nervous system: Alert and oriented to name only. No focal neurological deficits. Extremities: Symmetric 4 x 5 power. Skin: No rashes, lesions or ulcers Psychiatry: Judgement and insight appear poor                Diet Orders (From admission, onward)     Start     Ordered   08/23/24 0934  Diet heart healthy/carb modified Room service appropriate? Yes; Fluid consistency: Thin  Diet effective now       Question Answer Comment  Diet-HS Snack? Nothing   Room service appropriate? Yes   Fluid consistency: Thin      08/23/24 0947            Objective: Vitals:   08/27/24 2239 08/28/24 0432 08/28/24 0438 08/28/24 0849  BP:  (!) 160/78  ROLLEN)  151/75  Pulse: 75 74  72  Resp:  18  19  Temp:  97.7 F (36.5 C)  98 F (36.7 C)  TempSrc:      SpO2: 95% 90% 92% 98%  Weight:      Height:        Intake/Output Summary (Last 24 hours) at 08/28/2024 0928 Last data filed at 08/28/2024 0416 Gross per 24 hour  Intake 612.5 ml  Output 2202 ml  Net -1589.5 ml   Filed Weights    08/25/24 1520 08/27/24 0923 08/27/24 1245  Weight: 90.5 kg 89.3 kg 87.3 kg    Scheduled Meds:  (feeding supplement) PROSource Plus  30 mL Oral BID   amLODipine   5 mg Oral Daily   aspirin   325 mg Oral Daily   atorvastatin   20 mg Oral q1800   carvedilol   25 mg Oral BID   Chlorhexidine  Gluconate Cloth  6 each Topical Q0600   dextrose   25 g Intravenous STAT   ezetimibe   10 mg Oral Daily   ferrous sulfate   325 mg Oral Q breakfast   heparin   5,000 Units Subcutaneous Q8H   hydrALAZINE   25 mg Oral TID   Influenza vac split trivalent PF  0.5 mL Intramuscular Tomorrow-1000   insulin  aspart  0-6 Units Subcutaneous TID WC   insulin  glargine  8 Units Subcutaneous QHS   levETIRAcetam   250 mg Intravenous Q12H   [START ON 08/29/2024] levETIRAcetam   250 mg Intravenous Q M,W,F-2000   levothyroxine   50 mcg Oral QAC breakfast   multivitamin with minerals  1 tablet Oral Daily   pantoprazole   40 mg Oral Daily   polyethylene glycol  17 g Oral Daily   senna-docusate  2 tablet Oral Daily   sodium bicarbonate   650 mg Oral TID   sodium chloride  flush  10-40 mL Intracatheter Q12H   sodium chloride  flush  3 mL Intravenous Q12H   [START ON 08/30/2024] thiamine   100 mg Oral Daily   Continuous Infusions:  thiamine  (VITAMIN B1) injection 500 mg (08/26/24 1520)    Nutritional status     Body mass index is 25.39 kg/m.  Data Reviewed:   CBC: Recent Labs  Lab 08/22/24 2314 08/24/24 0230 08/25/24 0347 08/25/24 1006 08/26/24 0435 08/27/24 0533 08/28/24 0336  WBC 8.7   < > 9.2 9.0 8.5 7.2 7.4  NEUTROABS 7.3  --   --   --   --   --   --   HGB 8.8*  9.5*   < > 8.9* 9.6* 8.9* 9.4* 8.9*  HCT 27.9*  28.0*   < > 27.7* 29.6* 27.2* 28.8* 27.4*  MCV 92.1   < > 90.5 90.5 90.7 89.7 89.8  PLT 159   < > 158 160 171 159 165   < > = values in this interval not displayed.   Basic Metabolic Panel: Recent Labs  Lab 08/24/24 2228 08/25/24 0347 08/25/24 1006 08/26/24 0435 08/27/24 0533 08/28/24 0336  NA  135 137 136 137 136 135  K 3.7 3.5 3.4* 3.5 3.3* 3.6  CL 94* 95* 96* 94* 95* 95*  CO2 32 28 25 24 26 29   GLUCOSE 91 86 96 95 123* 156*  BUN 45* 46* 50* 34* 43* 33*  CREATININE 3.53* 3.56* 3.70* 2.75* 3.29* 2.64*  CALCIUM  8.2* 8.3* 8.1* 8.0* 8.3* 8.1*  MG 2.0 2.1  --  1.9 2.0 2.0  PHOS  --  4.5 4.5  --  4.0  --    GFR: Estimated Creatinine  Clearance: 27.7 mL/min (A) (by C-G formula based on SCr of 2.64 mg/dL (H)). Liver Function Tests: Recent Labs  Lab 08/22/24 2314 08/24/24 0230 08/25/24 1006  AST 17 <10*  --   ALT 14 11  --   ALKPHOS 132* 116  --   BILITOT 1.4* 1.2  --   PROT 6.3* 5.9*  --   ALBUMIN 2.8* 2.6* 2.6*   No results for input(s): LIPASE, AMYLASE in the last 168 hours. Recent Labs  Lab 08/24/24 1449  AMMONIA 20   Coagulation Profile: Recent Labs  Lab 08/22/24 2314  INR 1.2   Cardiac Enzymes: Recent Labs  Lab 08/22/24 2314  CKTOTAL 29*   BNP (last 3 results) No results for input(s): PROBNP in the last 8760 hours. HbA1C: No results for input(s): HGBA1C in the last 72 hours. CBG: Recent Labs  Lab 08/27/24 0813 08/27/24 1703 08/27/24 2009 08/27/24 2012 08/28/24 0849  GLUCAP 121* 210* 23* 163* 121*   Lipid Profile: No results for input(s): CHOL, HDL, LDLCALC, TRIG, CHOLHDL, LDLDIRECT in the last 72 hours. Thyroid  Function Tests: No results for input(s): TSH, T4TOTAL, FREET4, T3FREE, THYROIDAB in the last 72 hours. Anemia Panel: Recent Labs    08/27/24 0533  FERRITIN 503*  TIBC 171*  IRON 55   Sepsis Labs: Recent Labs  Lab 08/22/24 2320  LATICACIDVEN 0.8    No results found for this or any previous visit (from the past 240 hours).       Radiology Studies: No results found.         LOS: 5 days   Time spent= 35 mins    Burgess JAYSON Dare, MD [facility]  If 7PM-7AM, please contact night-coverage  08/28/2024, 9:28 AM

## 2024-08-28 NOTE — Plan of Care (Signed)
  Problem: Coping: Goal: Ability to adjust to condition or change in health will improve Outcome: Not Progressing   Problem: Metabolic: Goal: Ability to maintain appropriate glucose levels will improve Outcome: Not Progressing   Problem: Skin Integrity: Goal: Risk for impaired skin integrity will decrease Outcome: Not Progressing   Problem: Tissue Perfusion: Goal: Adequacy of tissue perfusion will improve Outcome: Not Progressing   Problem: Clinical Measurements: Goal: Respiratory complications will improve Outcome: Not Progressing   Problem: Coping: Goal: Level of anxiety will decrease Outcome: Not Progressing   Problem: Pain Managment: Goal: General experience of comfort will improve and/or be controlled Outcome: Not Progressing

## 2024-08-28 NOTE — Progress Notes (Signed)
 Notable change in mental status and responsiveness noted by nursing staff.  There was also question of possible right-sided facial droop initially.   Patient seen and examined at bedside. Vital signs were unremarkable, blood glucose was stable.  Patient's spouse was present at bedside as well. Stat CT head performed of the head which was negative for any acute pathology Neurology MD also evaluated the patient.  I had extensive goals of care discussion with the patient's wife and daughter-in-law.  They agree that we should start having palliative care discussions especially patient has been declining over the recent past few weeks with no obvious reversible causes.  Burgess Dare MD [facility]

## 2024-08-28 NOTE — Progress Notes (Signed)
 Patient awake and conversant, able to eat 1 cup of chocolate pudding while stating it's good.

## 2024-08-28 NOTE — Plan of Care (Signed)
  Problem: Coping: Goal: Ability to adjust to condition or change in health will improve Outcome: Progressing   Problem: Fluid Volume: Goal: Ability to maintain a balanced intake and output will improve Outcome: Progressing   Problem: Nutritional: Goal: Maintenance of adequate nutrition will improve Outcome: Progressing   Problem: Safety: Goal: Ability to remain free from injury will improve Outcome: Progressing

## 2024-08-28 NOTE — Progress Notes (Addendum)
 Robert Spears Progress Note   Subjective:   Patient seen and examined at bedside.  More alert today but not oriented.  Denies CP, SOB, abdominal pain and n/v/d.   Objective Vitals:   08/27/24 2239 08/28/24 0432 08/28/24 0438 08/28/24 0849  BP:  (!) 160/78  (!) 151/75  Pulse: 75 74  72  Resp:  18  19  Temp:  97.7 F (36.5 C)  98 F (36.7 C)  TempSrc:      SpO2: 95% 90% 92% 98%  Weight:      Height:       Physical Exam General:chronically ill appearing, confused male in NAD Heart:RRR, no mrg Lungs:CTAB, nml WOB on RA Abdomen:soft, NTND Extremities:no LE edema Dialysis Access: [facility]   Filed Weights   08/25/24 1520 08/27/24 0923 08/27/24 1245  Weight: 90.5 kg 89.3 kg 87.3 kg    Intake/Output Summary (Last 24 hours) at 08/28/2024 1034 Last data filed at 08/28/2024 0416 Gross per 24 hour  Intake 612.5 ml  Output 2202 ml  Net -1589.5 ml    Additional Objective Labs: Basic Metabolic Panel: Recent Labs  Lab 08/25/24 0347 08/25/24 1006 08/26/24 0435 08/27/24 0533 08/28/24 0336  NA 137 136 137 136 135  K 3.5 3.4* 3.5 3.3* 3.6  CL 95* 96* 94* 95* 95*  CO2 28 25 24 26 29   GLUCOSE 86 96 95 123* 156*  BUN 46* 50* 34* 43* 33*  CREATININE 3.56* 3.70* 2.75* 3.29* 2.64*  CALCIUM  8.3* 8.1* 8.0* 8.3* 8.1*  PHOS 4.5 4.5  --  4.0  --    Liver Function Tests: Recent Labs  Lab 08/22/24 2314 08/24/24 0230 08/25/24 1006  AST 17 <10*  --   ALT 14 11  --   ALKPHOS 132* 116  --   BILITOT 1.4* 1.2  --   PROT 6.3* 5.9*  --   ALBUMIN 2.8* 2.6* 2.6*   CBC: Recent Labs  Lab 08/22/24 2314 08/24/24 0230 08/25/24 0347 08/25/24 1006 08/26/24 0435 08/27/24 0533 08/28/24 0336  WBC 8.7   < > 9.2 9.0 8.5 7.2 7.4  NEUTROABS 7.3  --   --   --   --   --   --   HGB 8.8*  9.5*   < > 8.9* 9.6* 8.9* 9.4* 8.9*  HCT 27.9*  28.0*   < > 27.7* 29.6* 27.2* 28.8* 27.4*  MCV 92.1   < > 90.5 90.5 90.7 89.7 89.8  PLT 159   < > 158 160 171 159 165   < > = values in this  interval not displayed.    Cardiac Enzymes: Recent Labs  Lab 08/22/24 2314  CKTOTAL 29*   CBG: Recent Labs  Lab 08/27/24 0813 08/27/24 1703 08/27/24 2009 08/27/24 2012 08/28/24 0849  GLUCAP 121* 210* 23* 163* 121*   Iron Studies:  Recent Labs    08/27/24 0533  IRON 55  TIBC 171*  FERRITIN 503*   Lab Results  Component Value Date   INR 1.2 08/22/2024   Studies/Results: No results found.  Medications:  thiamine  (VITAMIN B1) injection 500 mg (08/26/24 1520)    (feeding supplement) PROSource Plus  30 mL Oral BID   amLODipine   5 mg Oral Daily   aspirin   325 mg Oral Daily   atorvastatin   20 mg Oral q1800   carvedilol   25 mg Oral BID   Chlorhexidine  Gluconate Cloth  6 each Topical Q0600   dextrose   25 g Intravenous STAT   ezetimibe   10  mg Oral Daily   ferrous sulfate   325 mg Oral Q breakfast   heparin   5,000 Units Subcutaneous Q8H   hydrALAZINE   25 mg Oral TID   Influenza vac split trivalent PF  0.5 mL Intramuscular Tomorrow-1000   insulin  aspart  0-6 Units Subcutaneous TID WC   insulin  glargine  8 Units Subcutaneous QHS   levETIRAcetam   250 mg Intravenous Q12H   [START ON 08/29/2024] levETIRAcetam   250 mg Intravenous Q M,W,F-2000   levothyroxine   50 mcg Oral QAC breakfast   multivitamin with minerals  1 tablet Oral Daily   pantoprazole   40 mg Oral Daily   polyethylene glycol  17 g Oral Daily   senna-docusate  2 tablet Oral Daily   sodium bicarbonate   650 mg Oral TID   sodium chloride  flush  10-40 mL Intracatheter Q12H   sodium chloride  flush  3 mL Intravenous Q12H   [START ON 08/30/2024] thiamine   100 mg Oral Daily    Dialysis Orders: MWF - [facility] 4hrs, BFR 400, DFR 600,  EDW 94kg, 2K/ 2.5Ca Heparin  No bolus ordered *Noted he was getting an Fe load in outpatient   Assessment/Plan: AMS - Neurology following; CT of the head showed large left-sided remote MCA territory CVA. MRI brain negative.  EEG showed slow cortical dysfunction of the  left central parietal region. On Keppra . Neurology following. Positive RPR - treponemal antibody test negative. Likely a false positive.  ESRD - Followed in [facility] for an AKI on MWF. 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 UOP ranging 150-300-not the best. SCr increasing to upper 3s between treatments, low muscle mass likely causing inaccurate representation of kidney function. Continue HD for now.   Plan to resume MWF schedule this week. Monitor volume, labs, and I&Os closely.   Hypertension/volume - BP elevated, amlodipine  increased to 5mg  08/27/24. UF 2L yesterday. Continue UF as tolerated. Anemia of CKD - Hgb 8.9.  Tsat 32%.  No indication for IV iron. Will start ESA as outpatient. Secondary Hyperparathyroidism -  Corr Ca and phos ok Nutrition - Renal diet with fluid restriction  Manuelita Labella, PA-C [facility] 08/28/2024,10:34 AM  LOS: 5 days

## 2024-08-28 NOTE — Evaluation (Signed)
 Clinical/Bedside Swallow Evaluation Patient Details  Name: Robert Spears MRN: [id number] Date of Birth: Sep 12, 1951  Today's Date: 08/28/2024 Time: SLP Start Time (ACUTE ONLY): 0820 SLP Stop Time (ACUTE ONLY): 0847 SLP Time Calculation (min) (ACUTE ONLY): 27 min  Past Medical History:  Past Medical History:  Diagnosis Date   A-fib (HCC)    Acute laryngotracheitis 11/05/2020   Formatting of this note might be different from the original. 11/05/2020   Allergy to bee sting    Benign hypertension with CKD (chronic kidney disease) stage III (HCC) 03/22/2019   Cerebral amyloid angiopathy (HCC) 01/22/2016   Formatting of this note might be different from the original. 2016: RIGHT ARM NUMB, RIGHT FACE WEAK, EXP DYSPHASIA; MOD CAROTID PLAQUE, AFIB 2018: residual numbness fingers right hand 2021: left occ-temp hemorrhage on NOAC with ex/rec dysphasia, lower facial droop, LUE weakness   Chronic bilateral low back pain without sciatica 08/01/2016   Formatting of this note might be different from the original. 2018: surg   Coronary artery disease 01/21/2016   Formatting of this note might be different from the original. Managed CARDS (1997) onset (2005) PCI of RCA, PCI with DES of RCA  post CABS  Formatting of this note might be different from the original. Managed CARDS (1997) onset (2005) PCI of RCA, PCI with DES of RCA  post CABG   Coronary artery disease involving native coronary artery of native heart without angina pectoris 08/09/2015   DDD (degenerative disc disease), lumbar 08/24/2017   Formatting of this note might be different from the original. 2018: surgery   Diabetes mellitus    Dyslipidemia 08/09/2015   Family history of premature coronary artery disease 09/17/2018   Formatting of this note might be different from the original. Brother   Hemiparesis of right dominant side (HCC) 07/24/2020   Formatting of this note might be different from the original. 2021: left occ-temp hemorrhage   High blood  pressure    High cholesterol    Hypertension 10/02/2015   Intracranial bleeding (HCC) 06/28/2020   Intraparenchymal hemorrhage of brain (HCC) 06/23/2020   Low vitamin B12 level 07/24/2020   Formatting of this note might be different from the original. 2021: 236   Mixed hyperlipidemia 01/21/2016   Formatting of this note might be different from the original. 01/2015 Not at goal LDL 79 HDL 26 (LDL 70 HDL 40)   Obesity (BMI 30-39.9) 11/01/2020   Permanent atrial fibrillation (HCC) 01/22/2016   Formatting of this note might be different from the original. 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   Swelling of both lower extremities 06/28/2020   TIA (transient ischemic attack) 08/21/2015   Type 2 diabetes mellitus with stage 3b chronic kidney disease, without long-term current use of insulin  (HCC) 01/21/2016   Overview:  04/2015 A1C has increased to 7.6   Uncontrolled hypertension 02/07/2021   Weight loss    Wellness examination 03/09/2020   Past Surgical History:  Past Surgical History:  Procedure Laterality Date   ARTERIAL BYPASS SURGRY     CARDIAC SURGERY     CORONARY ARTERY BYPASS GRAFT     CORONARY STENT PLACEMENT     LEFT ATRIAL APPENDAGE OCCLUSION  10/09/2020   HPI:  Pt is a 73 yo male admitted from SNF sp fall and then unresponsive  15 minutes later. Pt with encephalopathy. MRI negative. CXR showed congestive heart failture with worsening volume status and progressive pulmonary edema.  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 MBS in August 2023 showed sensed aspiration with thin and nectar thick liquids, with Dys 3 diet and honey thick liquids recommended.    Assessment / Plan /  Recommendation  Clinical Impression  Pt has a h/o dysphagia with sensed aspiration of liquids, but with no more recent documentation over the last few years. He indicates that he is not still on thickened liquids. He has anterior spillage out of the R side of his mouth with thin liquids, which he self-manages with Mod I. There is also overt coughing noted initially with thin liquids, concerning for reduced airway protection. Frequent eructation is also observed. As trials continued, he had no further overt s/s of aspiration when using a slower pace, and RN says that he has not noticed any coughing. Pt would not attempt solids with SLP this morning, so will need ongoing evaluation with consideration of MBS if coughing persists. For now, can continue on current diet with assistance and monitoring during meals.   SLP Visit Diagnosis: Dysphagia, unspecified (R13.10)    Aspiration Risk       Diet Recommendation Regular;Thin liquid    Liquid Administration via: Cup;Straw Medication Administration: Whole meds with puree Supervision: Staff to assist with self feeding;Full supervision/cueing for compensatory strategies Compensations: Minimize environmental distractions;Slow rate;Small sips/bites Postural Changes: Seated upright at 90 degrees;Remain upright for at least 30 minutes after po intake    Other  Recommendations Oral Care Recommendations: Oral care BID     Assistance Recommended at Discharge    Functional Status Assessment Patient has had a recent decline in their functional status and demonstrates the ability to make significant improvements in function in a reasonable and predictable amount of time.  Frequency and Duration min 2x/week  2 weeks       Prognosis Prognosis for improved oropharyngeal function: Good      Swallow Study   General HPI: Pt is a 73 yo male admitted from SNF sp fall and then unresponsive 15 minutes later. Pt with encephalopathy. MRI negative. CXR showed  congestive heart failture with worsening volume status and progressive pulmonary edema.  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 MBS in August 2023 showed sensed aspiration with thin and nectar thick liquids, with Dys 3 diet and honey thick liquids recommended. Type of Study: Bedside Swallow Evaluation Previous Swallow Assessment: see HPI Diet Prior to this Study: Regular;Thin liquids (Level 0) Temperature Spikes Noted: No Respiratory Status: Nasal cannula History of Recent Intubation: No Behavior/Cognition: Lethargic/Drowsy;Cooperative;Pleasant mood;Requires cueing Oral Cavity Assessment: Within Functional Limits Oral Care Completed by SLP: No Oral Cavity - Dentition: Dentures, top;Dentures, bottom Vision: Functional for self-feeding Self-Feeding Abilities: Able to feed self;Needs set up Patient Positioning: Upright in bed Baseline Vocal Quality: Normal Volitional Cough: Weak Volitional Swallow: Able to elicit    Oral/Motor/Sensory Function Overall Oral Motor/Sensory Function: Within functional limits   Ice Chips Ice chips: Not tested   Thin Liquid Thin Liquid: Impaired Presentation: Self Fed;Straw Oral Phase Functional Implications: Right anterior spillage Pharyngeal  Phase Impairments: Cough - Immediate    Nectar Thick Nectar Thick Liquid: Not tested   Honey Thick Honey  Thick Liquid: Not tested   Puree Puree: Not tested   Solid     Solid: Not tested (refused)      Leita SAILOR., M.A. CCC-SLP Acute Rehabilitation Services Office: [phone]  Secure chat preferred  08/28/2024,8:48 AM

## 2024-08-28 NOTE — TOC Progression Note (Signed)
 Transition of Care [facility]) - Progression Note    Patient Details  Name: Robert Spears MRN: [id number] Date of Birth: [birth year]  Transition of Care [facility]) CM/SW Contact  Emmalene Kattner [place], [place] Phone Number: 08/28/2024, 3:03 PM  Clinical Narrative: [name] to [name] with [employer] who reports [name] has been approved [id number]) but request for [name] has been sent for med review. Expect determination on SNF Monday.   Julien Das, MSW, LCSW [phone] (coverage)        Expected Discharge Plan: Skilled Nursing Facility Barriers to Discharge: Continued Medical Work up               Expected Discharge Plan and Services In-house Referral: Clinical Social Work     Living arrangements for the past 2 months: Skilled Nursing Facility                                       Social Drivers of Health (SDOH) Interventions SDOH Screenings   Food Insecurity: Low Risk  (07/15/2024)   Received from [facility]  Housing: Low Risk  (07/15/2024)   Received from [facility]  Transportation Needs: No Transportation Needs (07/15/2024)   Received from [facility]  Utilities: Patient Unable To Answer (08/23/2024)  Financial Resource Strain: Low Risk  (10/04/2018)   Received from [facility] visits prior to 02/14/2023.  Physical Activity: Inactive (10/04/2018)   Received from [facility] visits prior to 02/14/2023.  Social Connections: Patient Unable To Answer (08/23/2024)  Stress: No Stress Concern Present (10/04/2018)   Received from [facility] visits prior to 02/14/2023.  Tobacco Use: Medium Risk (08/23/2024)    Readmission Risk Interventions     No data to display

## 2024-08-28 NOTE — Progress Notes (Signed)
 NEUROLOGY CONSULT FOLLOW UP NOTE   Date of service: August 28, 2024 [name] MRN:  [id number] DOB:  1951/02/19  Interval Hx/subjective   Hospitalist team and rapid response team requested re-evaluation. They are worried about a R facial droop and significant somnolence.  Vitals   Vitals:   08/28/24 0432 08/28/24 0438 08/28/24 0849 08/28/24 1100  BP: (!) 160/78  (!) 151/75 133/67  Pulse: 74  72 69  Resp: 18  19 18   Temp: 97.7 F (36.5 C)  98 F (36.7 C) 97.8 F (36.6 C)  TempSrc:    Oral  SpO2: 90% 92% 98% 100%  Weight:      Height:         Body mass index is 25.39 kg/m.  Physical Exam    General: Laying comfortably in bed; in no acute distress.  HENT: Normal oropharynx and mucosa. Normal external appearance of ears and nose.  Neck: Supple, no pain or tenderness  CV: No JVD. No peripheral edema.  Pulmonary: Symmetric Chest rise. Normal respiratory effort.  Abdomen: Soft to touch, non-tender.  Ext: No cyanosis, edema, or deformity  Skin: No rash. Normal palpation of skin.   Musculoskeletal: Normal digits and nails by inspection. No clubbing.   Neurologic Examination  Mental status/Cognition: eyes closed, to vigorous tactile/noxious stimuli, makes brief eye contact. Does not answer questions or follow commands. Closes eyes and goes right back to sleep with in a couple secs of stopping noxious stimuli. Speech/language: mute. Cranial nerves:   CN II Pupils equal and reactive to light, made brief eye contact while standing on the right   CN III,IV,VI EOM intact to dolls eyes, no gaze preference or deviation, no nystagmus    CN V normal sensation in V1, V2, and V3 segments bilaterally   CN VII Symmetric facial grimace to noxious stimuli   CN VIII normal hearing to speech    CN IX & X normal palatal elevation, no uvular deviation    CN XI 5/5 head turn and 5/5 shoulder shrug bilaterally    CN XII midline tongue protrusion    Sensory/Motor:   Muscle bulk: normal, tone normal, pronator drift none tremor none Localizes in BL uppers. Lifts both arms off the bed and bring it closer to his nose with nares stimulation. To proximal pinch in BL lower extremities, he grimaces and lifts both legs.  Coordination/Complex Motor:  Unable to assess but movements are smooth and without any obvious ataxia and incoordination.  Medications  Current Facility-Administered Medications:    (feeding supplement) PROSource Plus liquid 30 mL, 30 mL, Oral, BID, Smith, Rondell A, MD, 30 mL at 08/28/24 9147   acetaminophen  (TYLENOL ) tablet 650 mg, 650 mg, Oral, Q6H PRN, 650 mg at 08/26/24 1207 **OR** acetaminophen  (TYLENOL ) suppository 650 mg, 650 mg, Rectal, Q6H PRN, Smith, Rondell A, MD   amLODipine  (NORVASC ) tablet 5 mg, 5 mg, Oral, Daily, Lenon Pfeiffer E, NP, 5 mg at 08/28/24 0851   aspirin  tablet 325 mg, 325 mg, Oral, Daily, Smith, Rondell A, MD, 325 mg at 08/28/24 0851   atorvastatin  (LIPITOR) tablet 20 mg, 20 mg, Oral, q1800, Smith, Rondell A, MD, 20 mg at 08/27/24 1820   carvedilol  (COREG ) tablet 25 mg, 25 mg, Oral, BID, Smith, Rondell A, MD, 25 mg at 08/28/24 0851   Chlorhexidine  Gluconate Cloth 2 % PADS 6 each, 6 each, Topical, Q0600, Lenon Pfeiffer BRAVO, NP, 6 each at 08/28/24 0537   [START ON 08/29/2024] Chlorhexidine  Gluconate Cloth 2 %  PADS 6 each, 6 each, Topical, Q0600, Penninger, Manuelita, [place]   dextrose  50 % solution 25 g, 25 g, Intravenous, STAT, Amin, Ankit C, MD   ezetimibe  (ZETIA ) tablet 10 mg, 10 mg, Oral, Daily, Smith, Rondell A, MD, 10 mg at 08/28/24 9147   ferrous sulfate  tablet 325 mg, 325 mg, Oral, Q breakfast, Claudene, Rondell A, MD, 325 mg at 08/28/24 0851   glucagon  (human recombinant) (GLUCAGEN) injection 1 mg, 1 mg, Intravenous, PRN, Amin, Ankit C, MD   guaiFENesin  (ROBITUSSIN) 100 MG/5ML liquid 5 mL, 5 mL, Oral, Q4H PRN, Amin, Ankit C, MD   heparin  injection 5,000 Units, 5,000 Units, Subcutaneous, Q8H, Smith, Rondell A, MD,  5,000 Units at 08/28/24 0537   hydrALAZINE  (APRESOLINE ) injection 10 mg, 10 mg, Intravenous, Q4H PRN, Amin, Ankit C, MD   hydrALAZINE  (APRESOLINE ) tablet 25 mg, 25 mg, Oral, TID, Claudene, Rondell A, MD, 25 mg at 08/28/24 0851   Influenza vac split trivalent PF (FLUZONE HIGH-DOSE) injection 0.5 mL, 0.5 mL, Intramuscular, Tomorrow-1000, Smith, Rondell A, MD   insulin  aspart (novoLOG ) injection 0-6 Units, 0-6 Units, Subcutaneous, TID WC, Smith, Rondell A, MD, 2 Units at 08/27/24 1713   insulin  glargine (LANTUS ) injection 8 Units, 8 Units, Subcutaneous, QHS, Smith, Rondell A, MD, 8 Units at 08/27/24 2133   ipratropium-albuterol  (DUONEB) 0.5-2.5 (3) MG/3ML nebulizer solution 3 mL, 3 mL, Nebulization, Q4H PRN, Amin, Ankit C, MD   [START ON 08/29/2024] levETIRAcetam  (KEPPRA ) undiluted injection 250 mg, 250 mg, Intravenous, Q M,W,F-2000, Amin, Ankit C, MD   levothyroxine  (SYNTHROID ) tablet 50 mcg, 50 mcg, Oral, QAC breakfast, Claudene Reeves A, MD, 50 mcg at 08/28/24 0537   metoprolol  tartrate (LOPRESSOR ) injection 5 mg, 5 mg, Intravenous, Q4H PRN, Amin, Ankit C, MD   multivitamin with minerals tablet 1 tablet, 1 tablet, Oral, Daily, Claudene, Rondell A, MD, 1 tablet at 08/28/24 9147   ondansetron  (ZOFRAN ) tablet 4 mg, 4 mg, Oral, Q6H PRN **OR** ondansetron  (ZOFRAN ) injection 4 mg, 4 mg, Intravenous, Q6H PRN, Smith, Rondell A, MD   pantoprazole  (PROTONIX ) EC tablet 40 mg, 40 mg, Oral, Daily, Amin, Ankit C, MD, 40 mg at 08/28/24 0851   polyethylene glycol (MIRALAX  / GLYCOLAX ) packet 17 g, 17 g, Oral, Daily, Smith, Rondell A, MD, 17 g at 08/28/24 0851   senna-docusate (Senokot-S) tablet 1 tablet, 1 tablet, Oral, QHS PRN, Amin, Ankit C, MD   senna-docusate (Senokot-S) tablet 2 tablet, 2 tablet, Oral, Daily, Claudene, Rondell A, MD, 2 tablet at 08/28/24 9147   sodium bicarbonate  tablet 650 mg, 650 mg, Oral, TID, Smith, Rondell A, MD, 650 mg at 08/28/24 0852   sodium chloride  flush (NS) 0.9 % injection 10-40 mL, 10-40 mL,  Intracatheter, Q12H, Amin, Ankit C, MD, 10 mL at 08/28/24 0855   sodium chloride  flush (NS) 0.9 % injection 10-40 mL, 10-40 mL, Intracatheter, PRN, Amin, Ankit C, MD   sodium chloride  flush (NS) 0.9 % injection 3 mL, 3 mL, Intravenous, Q12H, Smith, Rondell A, MD, 3 mL at 08/28/24 0855   thiamine  (VITAMIN B1) 500 mg in sodium chloride  0.9 % 50 mL IVPB, 500 mg, Intravenous, Q24H, Amin, Ankit C, MD, Last Rate: 110 mL/hr at 08/26/24 1520, 500 mg at 08/26/24 1520   [START ON 08/30/2024] thiamine  (VITAMIN B1) tablet 100 mg, 100 mg, Oral, Daily, Amin, Ankit C, MD  Labs and Diagnostic Imaging   CBC:  Recent Labs  Lab 08/22/24 2314 08/24/24 0230 08/27/24 0533 08/28/24 0336  WBC 8.7   < > 7.2 7.4  NEUTROABS 7.3  --   --   --   HGB 8.8*  9.5*   < > 9.4* 8.9*  HCT 27.9*  28.0*   < > 28.8* 27.4*  MCV 92.1   < > 89.7 89.8  PLT 159   < > 159 165   < > = values in this interval not displayed.    Basic Metabolic Panel:  Lab Results  Component Value Date   NA 135 08/28/2024   K 3.6 08/28/2024   CO2 29 08/28/2024   GLUCOSE 156 (H) 08/28/2024   BUN 33 (H) 08/28/2024   CREATININE 2.64 (H) 08/28/2024   CALCIUM  8.1 (L) 08/28/2024   GFRNONAA 25 (L) 08/28/2024   GFRAA 48 (L) 07/03/2020   Lipid Panel: No results found for: LDLCALC HgbA1c:  Lab Results  Component Value Date   HGBA1C 6.0 (H) 08/23/2024   Urine Drug Screen: No results found for: LABOPIA, COCAINSCRNUR, LABBENZ, AMPHETMU, THCU, LABBARB  Alcohol Level     Component Value Date/Time   [facility] <15 08/22/2024 2314   INR  Lab Results  Component Value Date   INR 1.2 08/22/2024   APTT  Lab Results  Component Value Date   APTT 28 08/22/2024   AED levels: No results found for: PHENYTOIN, ZONISAMIDE, LAMOTRIGINE, LEVETIRACETA  CT Head without contrast(Personally reviewed): CTH was negative for a large hypodensity concerning for a large territory infarct or hyperdensity concerning for an ICH  rEEG:   Pending  Assessment   Naeem Anakin Varkey is a 73 y.o. male with history of hypertension, diabetes, hyperlipidemia, CAD status post CABG, left occipital IPH in 2021, CAA, left parietal lobe infarct in 2021, A-fib status post Watchman device not on anticoagulation, left corona radiata stroke and end-stage renal disease on dialysis with recent admission for CHF and AKI who presents from his facility after being found unresponsive after a fall.  Patient was found in his rehab facility on the floor by his bed after an unwitnessed fall.  Staff placed him in a chair and then 15 minutes later noted him to be unresponsive.  Patient's family reports that his mental status has not come back to baseline and states that before his hospitalization 6 weeks ago, he was mowing the lawn and was independent with all ADLs.  Patient's family reports that he has never had a seizure, that no other family members have seizures of that he has never had a serious head injury, meningitis or encephalitis.  However, given his left parietal stroke, he is at risk for seizure activity.  Family also reports that he had a similar episode of unresponsiveness while he was at the hospital in [facility].  Routine EEG demonstrated no seizures, LTM EEG for 2 days with no seizures or epileptiform discharges.  He was started on Keppra  which was dosed for ESRD. He is significantly drowsy today.  I again think that his somnolence today is consistent with hospital acquirde delirium.  Will stop Keppra  as that is the only new medicaiton and Keppra  can sometimes cause sedation.  Recommendations  - will get routine EEG - stop Keppra  - delirium precautions. - neurology will be available as needed. ______________________________________________________________________  Plan discussed with hospitalist team and patient's wife at the bedside.  Signed, Thamara Leger, MD [facility]

## 2024-08-28 NOTE — Progress Notes (Addendum)
 EEG complete - results pending

## 2024-08-28 NOTE — Significant Event (Signed)
 Rapid Response Event Note   Reason for Call :  Unresponsive  Initial Focused Assessment:  Pt localizes to stimuli, briefly opens eyes and responds with non-intelligible speech. No focal deficits appreciated.   133/67 (88) HR 69 RR 17 O2 100% 3L CBG 118  Interventions/Plan of Care:  TRH MD to bedside CT head stat Neuro MD  Meds to be adjusted by team  Event Summary:  MD Notified: A. Caleen, MD; Sal MD Call Time: 1124  Arrival Time: 1127 End Time: 1150  Tonna Chiquita POUR, RN

## 2024-08-28 NOTE — Progress Notes (Signed)
 At 11:10 am patient was found very lethargic, unresponsive to verbal stimuli, but was responding to painful stimuli.Vital signs remain stable, and glucose level is measured at 118. Slight right facial drooping has been noted, along with sluggish pupil response.  The charge nurse and attending physician was notified, and the rapid response team has been called. The patient has been taken for a stat CT scan for further evaluation.

## 2024-08-29 DIAGNOSIS — E876 Hypokalemia: Secondary | ICD-10-CM | POA: Diagnosis not present

## 2024-08-29 DIAGNOSIS — N189 Chronic kidney disease, unspecified: Secondary | ICD-10-CM | POA: Diagnosis not present

## 2024-08-29 DIAGNOSIS — R410 Disorientation, unspecified: Secondary | ICD-10-CM

## 2024-08-29 DIAGNOSIS — G9340 Encephalopathy, unspecified: Secondary | ICD-10-CM | POA: Diagnosis not present

## 2024-08-29 DIAGNOSIS — R569 Unspecified convulsions: Secondary | ICD-10-CM | POA: Diagnosis not present

## 2024-08-29 DIAGNOSIS — N186 End stage renal disease: Secondary | ICD-10-CM

## 2024-08-29 DIAGNOSIS — Z515 Encounter for palliative care: Secondary | ICD-10-CM

## 2024-08-29 LAB — CBC
HCT: 27.5 % — ABNORMAL LOW (ref 39.0–52.0)
Hemoglobin: 8.8 g/dL — ABNORMAL LOW (ref 13.0–17.0)
MCH: 29.3 pg (ref 26.0–34.0)
MCHC: 32 g/dL (ref 30.0–36.0)
MCV: 91.7 fL (ref 80.0–100.0)
Platelets: 148 K/uL — ABNORMAL LOW (ref 150–400)
RBC: 3 MIL/uL — ABNORMAL LOW (ref 4.22–5.81)
RDW: 17.5 % — ABNORMAL HIGH (ref 11.5–15.5)
WBC: 7.1 K/uL (ref 4.0–10.5)
nRBC: 0 % (ref 0.0–0.2)

## 2024-08-29 LAB — BASIC METABOLIC PANEL WITH GFR
Anion gap: 14 (ref 5–15)
BUN: 39 mg/dL — ABNORMAL HIGH (ref 8–23)
CO2: 26 mmol/L (ref 22–32)
Calcium: 8.2 mg/dL — ABNORMAL LOW (ref 8.9–10.3)
Chloride: 98 mmol/L (ref 98–111)
Creatinine, Ser: 3.19 mg/dL — ABNORMAL HIGH (ref 0.61–1.24)
GFR, Estimated: 20 mL/min — ABNORMAL LOW (ref >60)
Glucose, Bld: 89 mg/dL (ref 70–99)
Potassium: 3.8 mmol/L (ref 3.5–5.1)
Sodium: 138 mmol/L (ref 135–145)

## 2024-08-29 LAB — GLUCOSE, CAPILLARY
Glucose-Capillary: 74 mg/dL (ref 70–99)
Glucose-Capillary: 83 mg/dL (ref 70–99)
Glucose-Capillary: 65 mg/dL — ABNORMAL LOW (ref 70–99)
Glucose-Capillary: 75 mg/dL (ref 70–99)
Glucose-Capillary: 119 mg/dL — ABNORMAL HIGH (ref 70–99)

## 2024-08-29 LAB — MAGNESIUM: Magnesium: 2 mg/dL (ref 1.7–2.4)

## 2024-08-29 MED ORDER — HEPARIN SODIUM (PORCINE) 1000 UNIT/ML DIALYSIS: 1000.0000 [IU] | INTRAMUSCULAR | Status: DC | PRN

## 2024-08-29 MED ORDER — HEPARIN SODIUM (PORCINE) 1000 UNIT/ML IJ SOLN
INTRAMUSCULAR | Status: AC
  Filled 2024-08-29: qty 4

## 2024-08-29 MED ORDER — LIDOCAINE HCL (PF) 1 % IJ SOLN: 5.0000 mL | INTRAMUSCULAR | Status: DC | PRN

## 2024-08-29 MED ORDER — ALTEPLASE 2 MG IJ SOLR: 2.0000 mg | Freq: Once | INTRAMUSCULAR | Status: DC | PRN

## 2024-08-29 MED ORDER — LIDOCAINE-PRILOCAINE 2.5-2.5 % EX CREA: 1.0000 | TOPICAL_CREAM | CUTANEOUS | Status: DC | PRN

## 2024-08-29 MED ORDER — ANTICOAGULANT SODIUM CITRATE 4% (200MG/5ML) IV SOLN: 5.0000 mL | Status: DC | PRN

## 2024-08-29 MED ORDER — PENTAFLUOROPROP-TETRAFLUOROETH EX AERO: 1.0000 | INHALATION_SPRAY | CUTANEOUS | Status: DC | PRN

## 2024-08-29 NOTE — Procedures (Signed)
 [name]  MRN: [id number]  Epilepsy Attending: Arlin MALVA Krebs  Referring Physician/Provider: Khaliqdina, Salman, MD  Date: 08/28/2024 Duration: 22.02 mins  Patient history: 73yo M with an episode of unresponsiveness 15 minutes after a fall. EEG to evaluate for seizure   Level of alertness: Awake  AEDs during EEG study: LEV   Technical aspects: This EEG study was done with scalp electrodes positioned according to the 10-20 International system of electrode placement. Electrical activity was reviewed with band pass filter of 1-70Hz , sensitivity of 7 uV/mm, display speed of 63mm/sec with a 60Hz  notched filter applied as appropriate. EEG data were recorded continuously and digitally stored.  Video monitoring was available and reviewed as appropriate.   Description: The posterior dominant rhythm consists of 8 Hz activity of moderate voltage (25-35 uV) seen predominantly in posterior head regions, symmetric and reactive to eye opening and eye closing. EEG showed continuous 3-5hz  theta- delta slowing in left centro-parietal region which at times. Intermittent generalized 3 to 6 Hz theta- delta slowing was also noted hyperventilation and photic stimulation were not performed.      ABNORMALITY - Continuous slow, left centro- parietal region - Intermittent slow, generalized   IMPRESSION: This study is suggestive of cortical dysfunction arising from left centro- parietal region likely secondary to underlying structural abnormality.  Additionally there is mild diffuse encephalopathy. No seizures or definite epileptiform discharges were seen throughout the recording.   Codi Folkerts O Chyrl Elwell

## 2024-08-29 NOTE — Progress Notes (Signed)
 PROGRESS NOTE    Robert Spears  FMW:[id number] DOB: 11-23-1951 DOA: 08/22/2024 PCP: Ofilia Lamar CROME, MD    Brief Narrative:  73 year old with history of HTN, HLD, A-fib status post LAAO with watchman's device in 2021, CAD status post CABG, DM2, ICH 2021, cerebral amyloid angiopathy, CVA, hypothyroidism, chronic back pain comes to the hospital for being unresponsive for several minutes followed by right arm weakness.  Initially code stroke was called and neurology was consulted.  CT of the head showed large left-sided remote MCA territory CVA, MRI brain negative and EEG showed slow cortical dysfunction of the left central parietal region.  Briefly started on Keppra  but due to drowsiness it was discontinued.   Assessment & Plan:   Fall with subsequent episode of unresponsiveness Acute encephalopathy Concerns of possible seizure activity.Initially code stroke was called and neurology was consulted.  CT of the head showed large left-sided remote MCA territory CVA, MRI brain negative and EEG showed slow cortical dysfunction of the left central parietal region.  Briefly started on Keppra  but due to drowsiness this was discontinued.  Repeat EEG shows generalized slowing -Folate, B12, TSH, UA ammonia are normal -Completed high-dose IV thiamine , now on p.o. - Zyprexa  discontinued  RPR, reactive -Titer are 1:1. treponemal antibody test is negative.  This is likely false positive   Uncontrolled diabetes mellitus type 2  Uncontrolled, A1c 6.0.  Continue sliding scale and Accu-Chek but due to poor oral intake, discontinue Lantus    ESRD on HD Nephrology following.   Heart failure with preserved ejection fraction, EF 55% Stable.  Recent outpatient echo on 8/1 shows EF of 55%   Hypokalemia As needed repletion   Permanent atrial fibrillation  Currently rate controlled. S/p Watchman device On Coreg  and aspirin    Anemia of chronic disease Hemoglobin 8.4. - Continue to monitor   CAD s/p  CABG - Continue aspirin , statin, beta-blocker   Hypothyroidism - Continue levothyroxine    DVT prophylaxis: Heparin  Advance Care Planning:   Code Status: Full Code     Consults: Neurology and nephrology   Status is: Inpatient Remains inpatient appropriate because: Discharge back to his rehab   PT Follow up Recs: Skilled Nursing-Short Term Rehab (<3 Hours/Day)08/24/2024 1300. TOC consulted.   Subjective: Patient seen in HD.  Awake and tracks me across the room.  Answers very basic questions but does not carry on any long meaningful conversation. Examination:  General exam: Appears calm and comfortable ; sluggish Respiratory system: Clear to auscultation. Respiratory effort normal. Cardiovascular system: S1 & S2 heard, RRR. No JVD, murmurs, rubs, gallops or clicks. No pedal edema. Gastrointestinal system: Abdomen is nondistended, soft and nontender. No organomegaly or masses felt. Normal bowel sounds heard. Central nervous system: Alert and oriented to name only. No focal neurological deficits. Extremities: Symmetric 4 x 5 power. Skin: No rashes, lesions or ulcers Psychiatry: Judgement and insight appear poor                Diet Orders (From admission, onward)     Start     Ordered   08/23/24 0934  Diet heart healthy/carb modified Room service appropriate? Yes; Fluid consistency: Thin  Diet effective now       Question Answer Comment  Diet-HS Snack? Nothing   Room service appropriate? Yes   Fluid consistency: Thin      08/23/24 0947            Objective: Vitals:   08/29/24 1020 08/29/24 1050 08/29/24 1116 08/29/24 1237  BP: ROLLEN)  153/89 (!) 154/83 (!) 148/82 (!) 152/80  Pulse: 61 77 63 (!) 50  Resp: (!) 22 (!) 27 13 17   Temp:   97.8 F (36.6 C) 98 F (36.7 C)  TempSrc:   Oral Oral  SpO2: 99% 98% 100% 100%  Weight:      Height:        Intake/Output Summary (Last 24 hours) at 08/29/2024 1238 Last data filed at 08/29/2024 1116 Gross per 24 hour   Intake 336 ml  Output 2000 ml  Net -1664 ml   Filed Weights   08/27/24 0923 08/27/24 1245 08/29/24 0748  Weight: 89.3 kg 87.3 kg 94 kg    Scheduled Meds:  (feeding supplement) PROSource Plus  30 mL Oral BID   amLODipine   5 mg Oral Daily   aspirin   325 mg Oral Daily   atorvastatin   20 mg Oral q1800   carvedilol   25 mg Oral BID   Chlorhexidine  Gluconate Cloth  6 each Topical Q0600   ezetimibe   10 mg Oral Daily   ferrous sulfate   325 mg Oral Q breakfast   heparin   5,000 Units Subcutaneous Q8H   hydrALAZINE   25 mg Oral TID   Influenza vac split trivalent PF  0.5 mL Intramuscular Tomorrow-1000   insulin  aspart  0-6 Units Subcutaneous TID WC   levETIRAcetam   250 mg Intravenous Q M,W,F-2000   levothyroxine   50 mcg Oral QAC breakfast   multivitamin with minerals  1 tablet Oral Daily   pantoprazole   40 mg Oral Daily   polyethylene glycol  17 g Oral Daily   senna-docusate  2 tablet Oral Daily   sodium bicarbonate   650 mg Oral TID   sodium chloride  flush  10-40 mL Intracatheter Q12H   sodium chloride  flush  3 mL Intravenous Q12H   [START ON 08/30/2024] thiamine   100 mg Oral Daily   Continuous Infusions:  thiamine  (VITAMIN B1) injection 500 mg (08/28/24 1518)    Nutritional status     Body mass index is 27.34 kg/m.  Data Reviewed:   CBC: Recent Labs  Lab 08/22/24 2314 08/24/24 0230 08/25/24 1006 08/26/24 0435 08/27/24 0533 08/28/24 0336 08/29/24 0457  WBC 8.7   < > 9.0 8.5 7.2 7.4 7.1  NEUTROABS 7.3  --   --   --   --   --   --   HGB 8.8*  9.5*   < > 9.6* 8.9* 9.4* 8.9* 8.8*  HCT 27.9*  28.0*   < > 29.6* 27.2* 28.8* 27.4* 27.5*  MCV 92.1   < > 90.5 90.7 89.7 89.8 91.7  PLT 159   < > 160 171 159 165 148*   < > = values in this interval not displayed.   Basic Metabolic Panel: Recent Labs  Lab 08/25/24 0347 08/25/24 1006 08/26/24 0435 08/27/24 0533 08/28/24 0336 08/29/24 0457  NA 137 136 137 136 135 138  K 3.5 3.4* 3.5 3.3* 3.6 3.8  CL 95* 96* 94* 95* 95*  98  CO2 28 25 24 26 29 26   GLUCOSE 86 96 95 123* 156* 89  BUN 46* 50* 34* 43* 33* 39*  CREATININE 3.56* 3.70* 2.75* 3.29* 2.64* 3.19*  CALCIUM  8.3* 8.1* 8.0* 8.3* 8.1* 8.2*  MG 2.1  --  1.9 2.0 2.0 2.0  PHOS 4.5 4.5  --  4.0  --   --    GFR: Estimated Creatinine Clearance: 23 mL/min (A) (by C-G formula based on SCr of 3.19 mg/dL (H)). Liver Function Tests: Recent Labs  Lab 08/22/24 2314 08/24/24 0230 08/25/24 1006  AST 17 <10*  --   ALT 14 11  --   ALKPHOS 132* 116  --   BILITOT 1.4* 1.2  --   PROT 6.3* 5.9*  --   ALBUMIN 2.8* 2.6* 2.6*   No results for input(s): LIPASE, AMYLASE in the last 168 hours. Recent Labs  Lab 08/24/24 1449  AMMONIA 20   Coagulation Profile: Recent Labs  Lab 08/22/24 2314  INR 1.2   Cardiac Enzymes: Recent Labs  Lab 08/22/24 2314  CKTOTAL 29*   BNP (last 3 results) No results for input(s): PROBNP in the last 8760 hours. HbA1C: No results for input(s): HGBA1C in the last 72 hours. CBG: Recent Labs  Lab 08/28/24 1107 08/28/24 1628 08/28/24 2010 08/29/24 0607 08/29/24 0608  GLUCAP 118* 87 93 74 83   Lipid Profile: No results for input(s): CHOL, HDL, LDLCALC, TRIG, CHOLHDL, LDLDIRECT in the last 72 hours. Thyroid  Function Tests: No results for input(s): TSH, T4TOTAL, FREET4, T3FREE, THYROIDAB in the last 72 hours. Anemia Panel: Recent Labs    08/27/24 0533  FERRITIN 503*  TIBC 171*  IRON 55   Sepsis Labs: Recent Labs  Lab 08/22/24 2320  LATICACIDVEN 0.8    No results found for this or any previous visit (from the past 240 hours).       Radiology Studies: EEG adult Result Date: 08/29/2024 Shelton Arlin KIDD, MD     08/29/2024  8:56 AM Patient Name: Ladavion Savitz MRN: [id number] Epilepsy Attending: Arlin KIDD Shelton Referring Physician/Provider: Khaliqdina, Salman, MD Date: 08/28/2024 Duration: 22.02 mins Patient history: 73yo M with an episode of unresponsiveness 15 minutes after a  fall. EEG to evaluate for seizure  Level of alertness: Awake AEDs during EEG study: LEV  Technical aspects: This EEG study was done with scalp electrodes positioned according to the 10-20 International system of electrode placement. Electrical activity was reviewed with band pass filter of 1-70Hz , sensitivity of 7 uV/mm, display speed of 41mm/sec with a 60Hz  notched filter applied as appropriate. EEG data were recorded continuously and digitally stored.  Video monitoring was available and reviewed as appropriate.  Description: The posterior dominant rhythm consists of 8 Hz activity of moderate voltage (25-35 uV) seen predominantly in posterior head regions, symmetric and reactive to eye opening and eye closing. EEG showed continuous 3-5hz  theta- delta slowing in left centro-parietal region which at times. Intermittent generalized 3 to 6 Hz theta- delta slowing was also noted hyperventilation and photic stimulation were not performed.    ABNORMALITY - Continuous slow, left centro- parietal region - Intermittent slow, generalized  IMPRESSION: This study is suggestive of cortical dysfunction arising from left centro- parietal region likely secondary to underlying structural abnormality.  Additionally there is mild diffuse encephalopathy. No seizures or definite epileptiform discharges were seen throughout the recording.  Priyanka KIDD Shelton   CT HEAD WO CONTRAST ( ) Result Date: 08/28/2024 CLINICAL DATA:  73 year old male with altered mental status comment cephalopathy. Recent shortness of breath, found down after a fall. EXAM: CT HEAD WITHOUT CONTRAST TECHNIQUE: Contiguous axial images were obtained from the base of the skull through the vertex without intravenous contrast. RADIATION DOSE REDUCTION: This exam was performed according to the departmental dose-optimization program which includes automated exposure control, adjustment of the mA and/or kV according to patient size and/or use of iterative reconstruction  technique. COMPARISON:  Brain MRI 08/23/2024.  Head CT 08/22/2024. FINDINGS: Brain: Posterior left hemisphere chronic encephalomalacia. Ex vacuo ventricular enlargement. Patchy  and confluent bilateral cerebral white matter hypodensity. Small chronic right cerebellar infarct. Stable gray-white matter differentiation throughout the brain. No midline shift, ventriculomegaly, mass effect, evidence of mass lesion, intracranial hemorrhage or evidence of cortically based acute infarction. Vascular: Calcified atherosclerosis at the skull base. No suspicious intracranial vascular hyperdensity. Skull: Appears stable and intact. Sinuses/Orbits: Visualized paranasal sinuses and mastoids are stable and well aerated. Other: No acute orbit or scalp soft tissue finding. IMPRESSION: 1. No acute intracranial abnormality. 2. Continued Stable non contrast CT appearance of chronic ischemic disease. Electronically Signed   By: VEAR Hurst M.D.   On: 08/28/2024 11:39           LOS: 6 days   Time spent= 35 mins    Burgess JAYSON Dare, MD [facility]  If 7PM-7AM, please contact night-coverage  08/29/2024, 12:38 PM

## 2024-08-29 NOTE — Plan of Care (Signed)
  Problem: Metabolic: Goal: Ability to maintain appropriate glucose levels will improve Outcome: Not Progressing   Problem: Skin Integrity: Goal: Risk for impaired skin integrity will decrease Outcome: Not Progressing   Problem: Tissue Perfusion: Goal: Adequacy of tissue perfusion will improve Outcome: Not Progressing   Problem: Clinical Measurements: Goal: Diagnostic test results will improve Outcome: Not Progressing   Problem: Safety: Goal: Ability to remain free from injury will improve Outcome: Not Progressing   Problem: Skin Integrity: Goal: Risk for impaired skin integrity will decrease Outcome: Not Progressing

## 2024-08-29 NOTE — Progress Notes (Signed)
 PT Cancellation Note  Patient Details Name: Edis Huish MRN: [id number] DOB: 04/12/1951   Cancelled Treatment:    Reason Eval/Treat Not Completed: Other (comment)  Currently in HD;  Will follow up later today as time allows;  Otherwise, will follow up for PT tomorrow;   Thank you,  Silvano Currier, PT  Acute Rehabilitation Services Office [phone]    Silvano VEAR Currier 08/29/2024, 7:58 AM

## 2024-08-29 NOTE — Progress Notes (Signed)
 SLP Cancellation Note  Patient Details Name: Robert Spears MRN: [id number] DOB: 04-15-1951   Cancelled treatment:       Reason Eval/Treat Not Completed: Patient at procedure or test/unavailable Pt in HD. Will continue efforts.    Dustin Olam Bull 08/29/2024, 9:14 AM

## 2024-08-29 NOTE — Care Management Important Message (Signed)
 Important Message  Patient Details  Name: Robert Spears MRN: [id number] Date of Birth: 1951/02/25   Important Message Given:  Yes - Medicare IM  IM provided to the patient on 08/26/2024   Claretta Deed 08/29/2024, 8:33 AM

## 2024-08-29 NOTE — Consult Note (Signed)
 Consultation Note Date: 08/29/2024 at 1100  [name]  DOB: 16-Jun-1951  MRN: [id number]  Age / Sex: 73 y.o., male  PCP: Ofilia Lamar CROME, MD Referring Physician: Caleen Burgess BROCKS, MD  HPI/Patient Profile: 73 y.o. male  with past medical history of HTN, HLD, A-fib status post LAAO with watchman's device in 2021, CAD status post CABG, DM2, ICH 2021, cerebral amyloid angiopathy, CVA, hypothyroidism, chronic back pain  admitted on 08/22/2024 after being found unresponsive for several minutes followed by right arm weakness at his facility ([name]).  Of note, patient was hospitalized 7/21 - 8/28 at [facility] and then discharged to SNF for rehab.  Code stroke was initiated and neurology was consulted.  CT of the head revealed large left-sided remote MCA territory CVA, MRI of the brain was negative, and EEG revealed slow cortical dysfunction of the left central parietal region.  Patient was started on Keppra  but due to drowsiness it was discontinued.  Patient is being treated for a fall with subsequent episode of unresponsiveness, acute encephalopathy, uncontrolled diabetes, ESRD (HD), and hypokalemia.  PMT was consulted to support patient and family with goals of care discussions.   Clinical Assessment and Goals of Care: Extensive chart review completed prior to meeting patient including labs, vital signs, imaging, progress notes, orders, and available advanced directive documents from current and previous encounters. I then met with patient in hemodialysis suite to discuss diagnosis prognosis, GOC, EOL wishes, disposition and options.  He is asleep but easily awakens to my presence.  He makes eye contact with me, says hello, but is unable to make conversation or participate in goals of care discussions independently at this time.  Once patient had returned from hemodialysis, I met with  patient and his wife at bedside in 5 M21.  I introduced Palliative Medicine as specialized medical care for people living with serious illness. It focuses on providing relief from the symptoms and stress of a serious illness. The goal is to improve quality of life for both the patient and the family.  Patient remains asleep during the entirety of my visit.  He intermittently awakens, acknowledges my presence, but quickly returns back to sleep.  Information in this note is provided by his wife.  We discussed a brief life review of the patient.  Patient and his wife have been married for 56 years.  They have 2 living children (lost a daughter at age 22 due to postop complications).  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  Patient's I retired [employer].  In retirement, wife endorses patient enjoyed flying planes and traveling.  As far as functional and nutritional status patient's wife endorses patient was independent with ADLs, made her breakfast every morning, and was ambulatory without aid prior to his admission to [facility] and late July of this year.  She shares that since that admission, everything has gone downhill.  We discussed patient's current illness and what it means in the larger  context of patient's on-going co-morbidities.  Education provided on hemodialysis, CVA and their residuals, EEG, aphasia.  I attempted to elicit values and goals of care important to the patient.  Patient's wife shares that she wants to take him home.  She believes he is not able to participate in rehab and therefore cannot go to SNF for rehab.  Additionally, she does not want to place him in a nursing home.  She shares understanding that his strokes are not reversible and that his current state may be his new baseline.  She is tearful.  Therapeutic silence, active listening, and emotional support provided.  Advance directives, concepts specific to code status,  artificial feeding and hydration, and rehospitalization were considered and discussed.  Patient's wife shares they have created a living will.  She shares she does not know where it is at the time but remembers that it stated that patient would not want any artificial intervention if all of their efforts had ceased to keep him alive.  Essentially, she believes he would not want life-prolonging measures such as a ventilator or CPR.  However, she shares that she is endorse full code and full scope during previous hospitalization and this 1 because she is been in shock at how quickly has deteriorated and has not wanted to make such a big decision.  Extensive education on full code versus DNR/DNI status.  Space and opportunity provided for patient's wife to share her thoughts and ask questions.  She shares she wants to speak with her children before making any change to plan of care.  She shares she believes that if he could make his decision known at this time he would be a DNR and DNI.  However, she is not prepared herself to make that decision for him.  Discussed reality of what it would look like for patient to discharge home with home health and wife's support.  She shares she probably would not last more than 12 hours and be able to give him the help that he needs.  Briefly discussed option of enacting hospice benefits.  Reviewed hospice is allowing patient to focus on comfort, aging in place, and being aggressive with symptom management.  Wife shares she is not prepared to shift to comfort focused measures at this time.  Additionally reviewed that comfort focused measures would be and stopping hemodialysis.  Discussed quality of life, human mortality, and goal of hemodialysis to give patient's a better quality of life.  Education offered regarding concept specific to human mortality and the limitations of medical interventions to prolong life when the body begins to fail to thrive.   Discussed with  patient/family the importance of continued conversation with family and the medical providers regarding overall plan of care and treatment options, ensuring decisions are within the context of the patient's values and GOCs.    PMT contact info given.  Wife was encouraged to reach out to PMT with any additional questions or concerns.  I plan to follow-up with the patient and wife again tomorrow.  No change to plan of care at this time.  Primary Decision Maker NEXT OF KIN  Physical Exam Vitals reviewed.  Neurological:     Mental Status: He is alert.     Palliative Assessment/Data: 30-40%     Thank you for this consult. Palliative medicine will continue to follow and assist holistically.   Visit includes: Detailed review of medical records (labs, imaging, vital signs), medically appropriate exam (mental status, respiratory, cardiac, skin), discussed  with treatment team, counseling and educating patient, family and staff, documenting clinical information, medication management and coordination of care.  Signed by: Lamarr Gunner, DNP, FNP-BC Palliative Medicine   Please contact Palliative Medicine Team providers via [facility] for questions and concerns.

## 2024-08-29 NOTE — Progress Notes (Signed)
 Speech Language Pathology Treatment: Dysphagia  Patient Details Name: Robert Spears MRN: [id number] DOB: 04/12/1951 Today's Date: 08/29/2024 Time: 8758-8750 SLP Time Calculation (min) (ACUTE ONLY): 8 min  Assessment / Plan / Recommendation Clinical Impression  Pt seen after HD and was drowsy needing verbal/tactile cues to arouse intermittently with wife at bedside. Pt noted to cough prior to po's and wife noted he sometimes coughs more after dialysis however she denied pt coughing with po's. Masticated cracker without dentures timely and oral cavity clear- wife stated he had dentures after pt completed cracker. She reported he can eat without them but typically uses them. He consumed majority of thin via straw without s/s aspiration but did have one delayed cough. Pt appears safe to continue regular texture, thin liquids, reminded him to take small sips and pills as tolerated. Cued pt and wife to wait until he is more awake to consume lunch. No further ST needed.    HPI HPI: Pt is a 73 yo male admitted from SNF sp fall and then unresponsive 15 minutes later. Pt with encephalopathy. MRI negative. CXR showed congestive heart failture with worsening volume status and progressive pulmonary edema.  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 MBS in August 2023 showed sensed aspiration with thin and nectar thick liquids, with Dys 3 diet and honey thick liquids recommended.      SLP Plan  All goals met;Discharge SLP treatment due to (comment)          Recommendations  Diet recommendations: Regular;Thin liquid Liquids provided via: Cup;Straw Medication Administration: Whole meds with puree (or as tolerated) Supervision: Patient able to self feed Compensations: Slow rate;Minimize environmental distractions;Small sips/bites Postural Changes and/or Swallow Maneuvers: Seated upright 90 degrees                   Oral care BID   Intermittent Supervision/Assistance Dysphagia, unspecified (R13.10)     All goals met;Discharge SLP treatment due to (comment)     Robert Spears  08/29/2024, 1:06 PM

## 2024-08-29 NOTE — Progress Notes (Signed)
  Received patient in bed to unit.   Informed consent signed and in chart.    TX duration:3     Transported by  Hand-off given to patient's nurse.    Access used: RT CVC Access issues: ran lines reversed. Changed dressing   Total UF removed: 2000 Medication(s) given: None Post HD VS: 160/71 Post HD weight: 90.5     Lien Lyman LPN [facility] D[facility] Unit

## 2024-08-29 NOTE — Progress Notes (Signed)
 [facility] Progress Note   Subjective:  Seen on HD - 2L UFG. Awake, but not oriented, unable to give clear answers to questions. S/p RR yesterday after non-responsive event, s/p head CT which did not show any new issues. Keppra  was held d/t concern for sedation.  Objective Vitals:   08/29/24 0950 08/29/24 1020 08/29/24 1050 08/29/24 1116  BP: (!) 159/66 (!) 153/89 (!) 154/83 (!) 148/82  Pulse: (!) 57 61 77 63  Resp: (!) 25 (!) 22 (!) 27 13  Temp:    97.8 F (36.6 C)  TempSrc:    Oral  SpO2: 97% 99% 98% 100%  Weight:      Height:       Physical Exam General: Elderly, ill appearing man, NAD. [place] O2 in place. Heart: RRR; no murmur Lungs: CTA anteriorly Abdomen: soft Extremities: no LE edema Dialysis Access: [facility]  Additional Objective Labs: Basic Metabolic Panel: Recent Labs  Lab 08/25/24 0347 08/25/24 1006 08/26/24 0435 08/27/24 0533 08/28/24 0336 08/29/24 0457  NA 137 136   < > 136 135 138  K 3.5 3.4*   < > 3.3* 3.6 3.8  CL 95* 96*   < > 95* 95* 98  CO2 28 25   < > 26 29 26   GLUCOSE 86 96   < > 123* 156* 89  BUN 46* 50*   < > 43* 33* 39*  CREATININE 3.56* 3.70*   < > 3.29* 2.64* 3.19*  CALCIUM  8.3* 8.1*   < > 8.3* 8.1* 8.2*  PHOS 4.5 4.5  --  4.0  --   --    < > = values in this interval not displayed.   Liver Function Tests: Recent Labs  Lab 08/22/24 2314 08/24/24 0230 08/25/24 1006  AST 17 <10*  --   ALT 14 11  --   ALKPHOS 132* 116  --   BILITOT 1.4* 1.2  --   PROT 6.3* 5.9*  --   ALBUMIN 2.8* 2.6* 2.6*   CBC: Recent Labs  Lab 08/22/24 2314 08/24/24 0230 08/25/24 1006 08/26/24 0435 08/27/24 0533 08/28/24 0336 08/29/24 0457  WBC 8.7   < > 9.0 8.5 7.2 7.4 7.1  NEUTROABS 7.3  --   --   --   --   --   --   HGB 8.8*  9.5*   < > 9.6* 8.9* 9.4* 8.9* 8.8*  HCT 27.9*  28.0*   < > 29.6* 27.2* 28.8* 27.4* 27.5*  MCV 92.1   < > 90.5 90.7 89.7 89.8 91.7  PLT 159   < > 160 171 159 165 148*   < > = values in this interval not displayed.    CBG: Recent Labs  Lab 08/28/24 1107 08/28/24 1628 08/28/24 2010 08/29/24 0607 08/29/24 0608  GLUCAP 118* 87 93 74 83   Iron Studies:  Recent Labs    08/27/24 0533  IRON 55  TIBC 171*  FERRITIN 503*   Studies/Results: EEG adult Result Date: 08/29/2024 Shelton Arlin KIDD, MD     08/29/2024  8:56 AM Patient Name: Branton Einstein MRN: [id number] Epilepsy Attending: Arlin KIDD Shelton Referring Physician/Provider: Khaliqdina, Salman, MD Date: 08/28/2024 Duration: 22.02 mins Patient history: 73yo M with an episode of unresponsiveness 15 minutes after a fall. EEG to evaluate for seizure  Level of alertness: Awake AEDs during EEG study: LEV  Technical aspects: This EEG study was done with scalp electrodes positioned according to the 10-20 International system of electrode placement. Electrical activity was  reviewed with band pass filter of 1-70Hz , sensitivity of 7 uV/mm, display speed of 68mm/sec with a 60Hz  notched filter applied as appropriate. EEG data were recorded continuously and digitally stored.  Video monitoring was available and reviewed as appropriate.  Description: The posterior dominant rhythm consists of 8 Hz activity of moderate voltage (25-35 uV) seen predominantly in posterior head regions, symmetric and reactive to eye opening and eye closing. EEG showed continuous 3-5hz  theta- delta slowing in left centro-parietal region which at times. Intermittent generalized 3 to 6 Hz theta- delta slowing was also noted hyperventilation and photic stimulation were not performed.    ABNORMALITY - Continuous slow, left centro- parietal region - Intermittent slow, generalized  IMPRESSION: This study is suggestive of cortical dysfunction arising from left centro- parietal region likely secondary to underlying structural abnormality.  Additionally there is mild diffuse encephalopathy. No seizures or definite epileptiform discharges were seen throughout the recording.  Priyanka MALVA Krebs   CT HEAD WO  CONTRAST ( ) Result Date: 08/28/2024 CLINICAL DATA:  73 year old male with altered mental status comment cephalopathy. Recent shortness of breath, found down after a fall. EXAM: CT HEAD WITHOUT CONTRAST TECHNIQUE: Contiguous axial images were obtained from the base of the skull through the vertex without intravenous contrast. RADIATION DOSE REDUCTION: This exam was performed according to the departmental dose-optimization program which includes automated exposure control, adjustment of the mA and/or kV according to patient size and/or use of iterative reconstruction technique. COMPARISON:  Brain MRI 08/23/2024.  Head CT 08/22/2024. FINDINGS: Brain: Posterior left hemisphere chronic encephalomalacia. Ex vacuo ventricular enlargement. Patchy and confluent bilateral cerebral white matter hypodensity. Small chronic right cerebellar infarct. Stable gray-white matter differentiation throughout the brain. No midline shift, ventriculomegaly, mass effect, evidence of mass lesion, intracranial hemorrhage or evidence of cortically based acute infarction. Vascular: Calcified atherosclerosis at the skull base. No suspicious intracranial vascular hyperdensity. Skull: Appears stable and intact. Sinuses/Orbits: Visualized paranasal sinuses and mastoids are stable and well aerated. Other: No acute orbit or scalp soft tissue finding. IMPRESSION: 1. No acute intracranial abnormality. 2. Continued Stable non contrast CT appearance of chronic ischemic disease. Electronically Signed   By: VEAR Hurst M.D.   On: 08/28/2024 11:39   Medications:  anticoagulant sodium citrate      thiamine  (VITAMIN B1) injection 500 mg (08/28/24 1518)    (feeding supplement) PROSource Plus  30 mL Oral BID   amLODipine   5 mg Oral Daily   aspirin   325 mg Oral Daily   atorvastatin   20 mg Oral q1800   carvedilol   25 mg Oral BID   Chlorhexidine  Gluconate Cloth  6 each Topical Q0600   ezetimibe   10 mg Oral Daily   ferrous sulfate   325 mg Oral Q  breakfast   heparin   5,000 Units Subcutaneous Q8H   hydrALAZINE   25 mg Oral TID   Influenza vac split trivalent PF  0.5 mL Intramuscular Tomorrow-1000   insulin  aspart  0-6 Units Subcutaneous TID WC   levETIRAcetam   250 mg Intravenous Q M,W,F-2000   levothyroxine   50 mcg Oral QAC breakfast   multivitamin with minerals  1 tablet Oral Daily   pantoprazole   40 mg Oral Daily   polyethylene glycol  17 g Oral Daily   senna-docusate  2 tablet Oral Daily   sodium bicarbonate   650 mg Oral TID   sodium chloride  flush  10-40 mL Intracatheter Q12H   sodium chloride  flush  3 mL Intravenous Q12H   [START ON 08/30/2024] thiamine   100 mg Oral Daily  Dialysis Orders MWF - [facility] (AKI status) 4hrs, BFR 400, DFR 600,  EDW 94kg, 2K/ 2.5Ca, no heparin  *Noted he was getting an Fe load in outpatient   Assessment/Plan: AMS - Neurology following; head CT showed prior L sided, MCA territory CVA. MRI brain confirmed the same - prior CVA, nothing acute. EEG with slowed cortical dysfunction of L parietal region. Started on Keppra , but then overly sedated so stopped. Per notes, he has been steadily declining. 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 UOP ranging 150-333mL - not the best. On HD since early 07/2024. Lack of muscle mass leading to low Cr - does not appear to be recovering. Continue HD on MWF schedule - HD now. Hypertension/volume: BP remains high, but improving with HD and ^ amlodipine  dose. Follow.  Anemia of CKD - Hgb 8.8, tsat 32%.  No indication for IV iron. Will start ESA as outpatient. Secondary Hyperparathyroidism - CorrCa/Phos ok - no meds for now. Nutrition - Alb low, continue supplements. False + RPR, negative treponemal ab test. GOC: Appears palliative consult has been placed - will await their discussions.   Izetta Boehringer, PA-C 08/29/2024, 11:23 AM  [employer]

## 2024-08-29 NOTE — TOC Progression Note (Addendum)
 Transition of Care [facility]) - Progression Note    Patient Details  Name: Robert Spears MRN: [id number] Date of Birth: 06/06/1951  Transition of Care [facility]) CM/SW Contact  Lendia Dais, [place] Phone Number: 08/29/2024, 10:17 AM  Clinical Narrative:  CSW received a call from Tammy at [employer]. Insurance shara was denied due to not meeting medical necessity. Option of peer to peer was given and MD was notified via secure chat.   1600 - Tammy faxed over reason for denial and option to appeal. CSW spoke to [name] at bedside and informed them of the appeal. Robert Spears stated that the denial confirms her answer of taking him home due to recent changes in his medical status. CSW asked if she had adequate support for this and she stated that she does and she knows it will be hard and but feels it's the right thing to do. CSW stated understanding.   Robert Spears asked if she changed her mind what would be the process of return to SNF. CSW stated that she would have to go through her PCP.  CSW will continue to monitor.    Expected Discharge Plan: Skilled Nursing Facility Barriers to Discharge: Continued Medical Work up               Expected Discharge Plan and Services In-house Referral: Clinical Social Work     Living arrangements for the past 2 months: Skilled Nursing Facility                                       Social Drivers of Health (SDOH) Interventions SDOH Screenings   Food Insecurity: Low Risk  (07/15/2024)   Received from [facility]  Housing: Low Risk  (07/15/2024)   Received from [facility]  Transportation Needs: No Transportation Needs (07/15/2024)   Received from [facility]  Utilities: Patient Unable To Answer (08/23/2024)  Financial Resource Strain: Low Risk  (10/04/2018)   Received from [facility] visits prior to 02/14/2023.  Physical Activity: Inactive (10/04/2018)   Received from [facility] visits prior to 02/14/2023.  Social Connections: Patient Unable To Answer (08/23/2024)  Stress: No Stress Concern Present (10/04/2018)   Received from [facility] visits prior to 02/14/2023.  Tobacco Use: Medium Risk (08/23/2024)    Readmission Risk Interventions     No data to display

## 2024-08-30 DIAGNOSIS — Z7189 Other specified counseling: Secondary | ICD-10-CM

## 2024-08-30 DIAGNOSIS — R410 Disorientation, unspecified: Secondary | ICD-10-CM | POA: Diagnosis not present

## 2024-08-30 DIAGNOSIS — N189 Chronic kidney disease, unspecified: Secondary | ICD-10-CM | POA: Diagnosis not present

## 2024-08-30 DIAGNOSIS — G9340 Encephalopathy, unspecified: Secondary | ICD-10-CM | POA: Diagnosis not present

## 2024-08-30 DIAGNOSIS — Z515 Encounter for palliative care: Secondary | ICD-10-CM | POA: Diagnosis not present

## 2024-08-30 LAB — CBC
HCT: 28.2 % — ABNORMAL LOW (ref 39.0–52.0)
Hemoglobin: 8.8 g/dL — ABNORMAL LOW (ref 13.0–17.0)
MCH: 28.9 pg (ref 26.0–34.0)
MCHC: 31.2 g/dL (ref 30.0–36.0)
MCV: 92.8 fL (ref 80.0–100.0)
Platelets: 147 K/uL — ABNORMAL LOW (ref 150–400)
RBC: 3.04 MIL/uL — ABNORMAL LOW (ref 4.22–5.81)
RDW: 17.6 % — ABNORMAL HIGH (ref 11.5–15.5)
WBC: 6.2 K/uL (ref 4.0–10.5)
nRBC: 0 % (ref 0.0–0.2)

## 2024-08-30 LAB — GLUCOSE, CAPILLARY
Glucose-Capillary: 93 mg/dL (ref 70–99)
Glucose-Capillary: 159 mg/dL — ABNORMAL HIGH (ref 70–99)
Glucose-Capillary: 145 mg/dL — ABNORMAL HIGH (ref 70–99)
Glucose-Capillary: 198 mg/dL — ABNORMAL HIGH (ref 70–99)

## 2024-08-30 LAB — BASIC METABOLIC PANEL WITH GFR
Anion gap: 14 (ref 5–15)
BUN: 30 mg/dL — ABNORMAL HIGH (ref 8–23)
CO2: 27 mmol/L (ref 22–32)
Calcium: 8.2 mg/dL — ABNORMAL LOW (ref 8.9–10.3)
Chloride: 97 mmol/L — ABNORMAL LOW (ref 98–111)
Creatinine, Ser: 2.7 mg/dL — ABNORMAL HIGH (ref 0.61–1.24)
GFR, Estimated: 24 mL/min — ABNORMAL LOW (ref >60)
Glucose, Bld: 97 mg/dL (ref 70–99)
Potassium: 3.9 mmol/L (ref 3.5–5.1)
Sodium: 138 mmol/L (ref 135–145)

## 2024-08-30 LAB — MAGNESIUM: Magnesium: 2 mg/dL (ref 1.7–2.4)

## 2024-08-30 NOTE — Plan of Care (Signed)

## 2024-08-30 NOTE — Progress Notes (Addendum)
 Physical Therapy Treatment Patient Details Name: Robert Spears MRN: [id number] DOB: 09/19/1951 Today's Date: 08/30/2024   History of Present Illness Pt is a 73 yo male admitted from SNF sp fall and then unresponsive 15 minutes later. CT-. Pt on continuous sz monitoring at this time. Pt with encephalopathy. 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    PT Comments  Continuing to follow; Noting Palliative Care Consult, and pt's medical status; Acute PT goals updated to reflect pt status;  Met with pt's [name], and son, Robert Spears; Robert Spears had just settled down after moving with Nursing, so opted to let him rest;   Knowing that Robert Spears is choosing to take pt home, we discussed considerations for positioning, for safe transfers, and proper body mechanics for caregivers; I anticipate Robert Spears will need a lot of support in caregiving for Robert Spears, and she indicated she can rely on family and friends;   For home, recommend:  Hospital bed, and perhaps rolling bedside table,  Wheelchair with cushion,  [name],  [facility] transport for HD, and other appointments;   Will take Palliative Care's lead; If PT interventions are no longer congruent with pt and family's Goals of Care, will sign off;    If plan is discharge home, recommend the following: A lot of help with walking and/or transfers;Two people to help with walking and/or transfers;A lot of help with bathing/dressing/bathroom   Can travel by private vehicle     No  Equipment Recommendations  Hospital bed;Wheelchair (measurements PT);Wheelchair cushion (measurements PT);Hoyer lift;Other (comment) (non-emergent ambulance transport home)    Recommendations for Other Services Other (comment) (Noting Palliative Medicine consult)     Precautions / Restrictions Precautions Precautions: Fall Recall of Precautions/Restrictions:  Impaired Restrictions Weight Bearing Restrictions Per Provider Order: No     Communication    Cognition Arousal: Obtunded Behavior During Therapy: Flat affect                                              General Comments General comments (skin integrity, edema, etc.): Son, Robert Spears present as well      Pertinent Vitals/Pain Pain Assessment Pain Assessment: Faces Faces Pain Scale: No hurt Pain Intervention(s): Monitored during session    Home Living                          Prior Function            PT Goals (current goals can now be found in the care plan section) Acute Rehab PT Goals Patient Stated Goal: Family wants to bring pt home PT Goal Formulation: With patient/family Time For Goal Achievement: 09/13/24 Potential to Achieve Goals: Fair Progress towards PT goals: Goals updated    Frequency    Min 2X/week      PT Plan      Co-evaluation              AM-PAC PT 6 Clicks Mobility   Outcome Measure  Help needed turning from your back to your side while in a flat bed without using bedrails?: A Lot Help needed moving from lying on your back to sitting on the side of a flat bed without using bedrails?: Total Help needed moving to and  from a bed to a chair (including a wheelchair)?: Total Help needed standing up from a chair using your arms (e.g., wheelchair or bedside chair)?: Total Help needed to walk in hospital room?: Total Help needed climbing 3-5 steps with a railing? : Total 6 Click Score: 7    End of Session     Patient left: in bed;with call bell/phone within reach;with bed alarm set;with family/visitor present   PT Visit Diagnosis: Unsteadiness on feet (R26.81);Other abnormalities of gait and mobility (R26.89);Muscle weakness (generalized) (M62.81);History of falling (Z91.81);Difficulty in walking, not elsewhere classified (R26.2)     Time: 1140-1201 PT Time Calculation (min) (ACUTE ONLY): 21  min  Charges:    $Self Care/Home Management: 8-22 PT General Charges $$ ACUTE PT VISIT: 1 Visit                     Robert Spears, PT  Acute Rehabilitation Services Office [phone] Secure Chat welcomed    Robert VEAR Spears 08/30/2024, 4:05 PM

## 2024-08-30 NOTE — Progress Notes (Signed)
 Palliative Care Progress Note, Assessment & Plan   [name]       Date: 08/30/2024 DOB: December 19, 1950  Age: 73 y.o. MRN#: [id number] Attending Physician: Caleen Burgess BROCKS, MD Primary Care Physician: Ofilia Lamar CROME, MD Admit Date: 08/22/2024  Subjective: Patient is lying in bed with nasal cannula in place.  He is asleep and does not awaken to my presence.  He does not participate in goals of care medical decision making at this time.  His wife is at bedside and provides entirety of information during my visit.  No other family or friends present during my visit.  HPI: 73 y.o. male  with past medical history of HTN, HLD, A-fib status post LAAO with watchman's device in 2021, CAD status post CABG, DM2, ICH 2021, cerebral amyloid angiopathy, CVA, hypothyroidism, chronic back pain  admitted on 08/22/2024 after being found unresponsive for several minutes followed by right arm weakness at his facility ([name]).   Of note, patient was hospitalized 7/21 - 8/28 at [facility] and then discharged to SNF for rehab.   Code stroke was initiated and neurology was consulted.  CT of the head revealed large left-sided remote MCA territory CVA, MRI of the brain was negative, and EEG revealed slow cortical dysfunction of the left central parietal region.  Patient was started on Keppra  but due to drowsiness it was discontinued.   Patient is being treated for a fall with subsequent episode of unresponsiveness, acute encephalopathy, uncontrolled diabetes, ESRD (HD), and hypokalemia.   PMT was consulted to support patient and family with goals of care discussions.   Summary of counseling/coordination of care: Extensive chart review completed prior to meeting patient including labs, vital signs, imaging,  progress notes, orders, and available advanced directive documents from current and previous encounters.   After reviewing the patient's chart and assessing the patient at bedside, I spoke with patient in regards to symptom management and goals of care.   Attempted to assess symptoms.  However, patient remains asleep and unable to engage in full system assessment.  Patient's wife is just arrived bedside.  She shares she has had no signs of distress, pain, or significant issues at this time.  No adjustment to [facility] needed.  Briefly reviewed our discussion from yesterday.  I attempted to elicit values and goals important to patient and wife right now.  Patient's wife shares that she feels like it is the right decision to take him home.  She wants to know what taking him home would entail.  Discussed that patient's functional status remains poor and that he would require significant help with completing ADLs -likely 2+ assist with all toileting, feeding, and daily care.  She shares understanding that this will be a lifelong person.  She references that she has children, grandchildren, and a strong church family that can help her if needed.  During her visit, CSW Lendia joined our discussion.  She reiterated the difficulty that may be ahead if patient is at home and wife is sole caregiver.  Discussed that if she wishes to continue with current plan of care, patient will need to find transportation and be able to tolerate outpatient hemodialysis as  well as get back and forth to doctor's appointments.  Last clinical social worker and I reiterated that this will likely end up with a high risk for rehospitalization.  Patient's wife shares she does not wish to bring him back to the hospital.  Discussed options include continue with current plan of care or shifting to comfort focused care.  Hospice services, aging in place, and aggressive symptom management discussed in detail with patient's wife.  Space and  opportunity provided for wife to share thoughts and asked questions.  She is tearful but stands firm in her plan to take patient home-she is just unsure if it will be with palliative or hospice services.  I inquired about what patient would want for himself if he could see himself in this current medical situation.  Patient's wife shares he would not have wanted to do any treatment at [facility] in order to continue with current treatment as he is receiving right now.  I strongly advised patient's wife to uphold patient's wishes that he had made clear in the past when he was of sound mind.  Patient's wife shared that he specifically has stated in the past that he would never want to be kept alive on a machine or ever be placed on a ventilator.  After some discussion, patient's wife shares she is in agreement for CODE STATUS to be changed to DNR/DNI.  Adjustment made to [facility].  Attending and RN made aware.  During her visit and after clinical social worker left, attending joined discussion.  Patient's wife asked about prognosis if hemodialysis is stopped.  Reviewed that patient would likely have days to weeks once hemodialysis is stopped.  Patient's wife was appreciative of all of this information.  She shares she would like to speak with family that is coming to the hospital shortly.  She was in agreement for me to follow-up with her later this afternoon to discuss next steps.  I plan to return bedside this afternoon to continue goals of care discussions.  Physical Exam Vitals reviewed.  Constitutional:      General: He is not in acute distress. HENT:     Mouth/Throat:     Mouth: Mucous membranes are moist.  Cardiovascular:     Rate and Rhythm: Normal rate.     Pulses: Normal pulses.  Pulmonary:     Effort: Pulmonary effort is normal.  Abdominal:     Palpations: Abdomen is soft.  Skin:    Coloration: Skin is pale.  Psychiatric:        Behavior: Behavior normal.    Visit includes: Detailed  review of medical records (labs, imaging, vital signs), medically appropriate exam (mental status, respiratory, cardiac, skin), discussed with treatment team, counseling and educating patient, family and staff, documenting clinical information, medication management and coordination of care.  Lamarr L. Arvid, DNP, FNP-BC Palliative Medicine Team

## 2024-08-30 NOTE — Progress Notes (Signed)
 Contacted [facility] and provided FA with an update. Pt's schedule will remain MWF if pt returns home with plans to continue HD. Pt had been on a TTS schedule until pt went to snf. Case discussed with RN CM. Will assist as needed.   Randine Mungo Dialysis Navigator [phone]

## 2024-08-30 NOTE — Progress Notes (Signed)
 PROGRESS NOTE    Robert Spears  FMW:[id number] DOB: Oct 15, 1951 DOA: 08/22/2024 PCP: Ofilia Lamar CROME, MD    Brief Narrative:  73 year old with history of HTN, HLD, A-fib status post LAAO with watchman's device in 2021, CAD status post CABG, DM2, ICH 2021, cerebral amyloid angiopathy, CVA, hypothyroidism, chronic back pain comes to the hospital for being unresponsive for several minutes followed by right arm weakness.  Initially code stroke was called and neurology was consulted.  CT of the head showed large left-sided remote MCA territory CVA, MRI brain negative and EEG showed slow cortical dysfunction of the left central parietal region.  Briefly started on Keppra  but due to drowsiness it was discontinued. Currently ongoing discussions with palliative care services regarding goals of care.  Assessment & Plan:   Fall with subsequent episode of unresponsiveness Acute encephalopathy Concerns of possible seizure activity.Initially code stroke was called and neurology was consulted.  CT of the head showed large left-sided remote MCA territory CVA, MRI brain negative and EEG showed slow cortical dysfunction of the left central parietal region.  Briefly started on Keppra  but due to drowsiness this was discontinued.  Repeat EEG shows generalized slowing.  Patient Completed 5 days of high-dose IV thiamine  now on p.o. -Folate, B12, TSH, UA ammonia are normal - Zyprexa  and Keppra  discontinued due to drowsiness  RPR, reactive -Titer are 1:1. treponemal antibody test is negative.  This is likely false positive   Uncontrolled diabetes mellitus type 2  Uncontrolled, A1c 6.0.  Continue sliding scale and Accu-Chek but due to poor oral intake, discontinue Lantus    ESRD on HD Nephrology following.   Heart failure with preserved ejection fraction, EF 55% Stable.  Recent outpatient echo on 8/1 shows EF of 55%   Hypokalemia As needed repletion   Permanent atrial fibrillation  Currently rate  controlled. S/p Watchman device On Coreg  and aspirin    Anemia of chronic disease Hemoglobin remains around 8.5   CAD s/p CABG - Continue aspirin , statin, beta-blocker   Hypothyroidism - Continue levothyroxine   Ongoing goals of care discussion with palliative care services. Options for hospice have been presented. Our recommendation are clear for Hospice Care.  Will confirm he is DNR/DNI.    DVT prophylaxis: Heparin  DNR/DNI Advance Care Planning:     Consults: Neurology and nephrology   Status is: Inpatient Remains inpatient appropriate because:On going discussions with Palliative.    PT Follow up Recs: Skilled Nursing-Short Term Rehab (<3 Hours/Day)08/24/2024 1300. TOC consulted.   Subjective: Patient seen in HD.  Awake and tracks me across the room.  Answers very basic questions but does not carry on any long meaningful conversation. Met with Avelina at bedside, and spoke with Alan over the phone.   Examination:  General exam: Appears calm and comfortable ; sluggish Respiratory system: Clear to auscultation. Respiratory effort normal. Cardiovascular system: S1 & S2 heard, RRR. No JVD, murmurs, rubs, gallops or clicks. No pedal edema. Gastrointestinal system: Abdomen is nondistended, soft and nontender. No organomegaly or masses felt. Normal bowel sounds heard. Central nervous system: Alert and oriented to name only. No focal neurological deficits. Extremities: Symmetric 4 x 5 power. Skin: No rashes, lesions or ulcers Psychiatry: Judgement and insight appear poor                Diet Orders (From admission, onward)     Start     Ordered   08/23/24 0934  Diet heart healthy/carb modified Room service appropriate? Yes; Fluid consistency: Thin  Diet effective now  Question Answer Comment  Diet-HS Snack? Nothing   Room service appropriate? Yes   Fluid consistency: Thin      08/23/24 0947            Objective: Vitals:   08/29/24 1237 08/29/24  1610 08/29/24 2001 08/30/24 0747  BP: (!) 152/80 128/73 (!) 164/79 (!) 154/80  Pulse: (!) 50 84 70 88  Resp: 17 18 18 17   Temp: 98 F (36.7 C) 98 F (36.7 C) 98.9 F (37.2 C) 97.6 F (36.4 C)  TempSrc: Oral   Oral  SpO2: 100% 100% 100% 100%  Weight:      Height:        Intake/Output Summary (Last 24 hours) at 08/30/2024 1332 Last data filed at 08/30/2024 1230 Gross per 24 hour  Intake 320 ml  Output 0 ml  Net 320 ml   Filed Weights   08/27/24 0923 08/27/24 1245 08/29/24 0748  Weight: 89.3 kg 87.3 kg 94 kg    Scheduled Meds:  (feeding supplement) PROSource Plus  30 mL Oral BID   amLODipine   5 mg Oral Daily   aspirin   325 mg Oral Daily   atorvastatin   20 mg Oral q1800   carvedilol   25 mg Oral BID   Chlorhexidine  Gluconate Cloth  6 each Topical Q0600   ezetimibe   10 mg Oral Daily   ferrous sulfate   325 mg Oral Q breakfast   heparin   5,000 Units Subcutaneous Q8H   hydrALAZINE   25 mg Oral TID   Influenza vac split trivalent PF  0.5 mL Intramuscular Tomorrow-1000   insulin  aspart  0-6 Units Subcutaneous TID WC   levothyroxine   50 mcg Oral QAC breakfast   multivitamin with minerals  1 tablet Oral Daily   pantoprazole   40 mg Oral Daily   polyethylene glycol  17 g Oral Daily   senna-docusate  2 tablet Oral Daily   sodium chloride  flush  10-40 mL Intracatheter Q12H   sodium chloride  flush  3 mL Intravenous Q12H   thiamine   100 mg Oral Daily   Continuous Infusions:  Nutritional status     Body mass index is 27.34 kg/m.  Data Reviewed:   CBC: Recent Labs  Lab 08/26/24 0435 08/27/24 0533 08/28/24 0336 08/29/24 0457 08/30/24 0425  WBC 8.5 7.2 7.4 7.1 6.2  HGB 8.9* 9.4* 8.9* 8.8* 8.8*  HCT 27.2* 28.8* 27.4* 27.5* 28.2*  MCV 90.7 89.7 89.8 91.7 92.8  PLT 171 159 165 148* 147*   Basic Metabolic Panel: Recent Labs  Lab 08/25/24 0347 08/25/24 1006 08/26/24 0435 08/27/24 0533 08/28/24 0336 08/29/24 0457 08/30/24 0425  NA 137 136 137 136 135 138 138  K 3.5  3.4* 3.5 3.3* 3.6 3.8 3.9  CL 95* 96* 94* 95* 95* 98 97*  CO2 28 25 24 26 29 26 27   GLUCOSE 86 96 95 123* 156* 89 97  BUN 46* 50* 34* 43* 33* 39* 30*  CREATININE 3.56* 3.70* 2.75* 3.29* 2.64* 3.19* 2.70*  CALCIUM  8.3* 8.1* 8.0* 8.3* 8.1* 8.2* 8.2*  MG 2.1  --  1.9 2.0 2.0 2.0 2.0  PHOS 4.5 4.5  --  4.0  --   --   --    GFR: Estimated Creatinine Clearance: 27.1 mL/min (A) (by C-G formula based on SCr of 2.7 mg/dL (H)). Liver Function Tests: Recent Labs  Lab 08/24/24 0230 08/25/24 1006  AST <10*  --   ALT 11  --   ALKPHOS 116  --   BILITOT 1.2  --  PROT 5.9*  --   ALBUMIN 2.6* 2.6*   No results for input(s): LIPASE, AMYLASE in the last 168 hours. Recent Labs  Lab 08/24/24 1449  AMMONIA 20   Coagulation Profile: No results for input(s): INR, PROTIME in the last 168 hours. Cardiac Enzymes: No results for input(s): CKTOTAL, CKMB, CKMBINDEX, TROPONINI in the last 168 hours. BNP (last 3 results) No results for input(s): PROBNP in the last 8760 hours. HbA1C: No results for input(s): HGBA1C in the last 72 hours. CBG: Recent Labs  Lab 08/29/24 1231 08/29/24 1235 08/29/24 2000 08/30/24 0743 08/30/24 1113  GLUCAP 65* 75 119* 93 159*   Lipid Profile: No results for input(s): CHOL, HDL, LDLCALC, TRIG, CHOLHDL, LDLDIRECT in the last 72 hours. Thyroid  Function Tests: No results for input(s): TSH, T4TOTAL, FREET4, T3FREE, THYROIDAB in the last 72 hours. Anemia Panel: No results for input(s): VITAMINB12, FOLATE, FERRITIN, TIBC, IRON, RETICCTPCT in the last 72 hours. Sepsis Labs: No results for input(s): PROCALCITON, LATICACIDVEN in the last 168 hours.  No results found for this or any previous visit (from the past 240 hours).       Radiology Studies: EEG adult Result Date: 08/29/2024 Shelton Arlin KIDD, MD     08/29/2024  8:56 AM Patient Name: Dejion Grillo MRN: [id number] Epilepsy Attending: Arlin KIDD Shelton  Referring Physician/Provider: Khaliqdina, Salman, MD Date: 08/28/2024 Duration: 22.02 mins Patient history: 73yo M with an episode of unresponsiveness 15 minutes after a fall. EEG to evaluate for seizure  Level of alertness: Awake AEDs during EEG study: LEV  Technical aspects: This EEG study was done with scalp electrodes positioned according to the 10-20 International system of electrode placement. Electrical activity was reviewed with band pass filter of 1-70Hz , sensitivity of 7 uV/mm, display speed of 12mm/sec with a 60Hz  notched filter applied as appropriate. EEG data were recorded continuously and digitally stored.  Video monitoring was available and reviewed as appropriate.  Description: The posterior dominant rhythm consists of 8 Hz activity of moderate voltage (25-35 uV) seen predominantly in posterior head regions, symmetric and reactive to eye opening and eye closing. EEG showed continuous 3-5hz  theta- delta slowing in left centro-parietal region which at times. Intermittent generalized 3 to 6 Hz theta- delta slowing was also noted hyperventilation and photic stimulation were not performed.    ABNORMALITY - Continuous slow, left centro- parietal region - Intermittent slow, generalized  IMPRESSION: This study is suggestive of cortical dysfunction arising from left centro- parietal region likely secondary to underlying structural abnormality.  Additionally there is mild diffuse encephalopathy. No seizures or definite epileptiform discharges were seen throughout the recording.  Priyanka O Yadav           LOS: 7 days   Time spent= 35 mins    Burgess JAYSON Dare, MD [facility]  If 7PM-7AM, please contact night-coverage  08/30/2024, 1:32 PM

## 2024-08-30 NOTE — Progress Notes (Signed)
 [facility] Progress Note   Subjective:  Seen in room. Still looks frail/weak, but does seem a little more awake today, able to speak a little clearer. Looks like he ate all of his breakfast. Palliative care involved - awaiting further discussions with patient's children.  Objective Vitals:   08/29/24 1237 08/29/24 1610 08/29/24 2001 08/30/24 0747  BP: (!) 152/80 128/73 (!) 164/79 (!) 154/80  Pulse: (!) 50 84 70 88  Resp: 17 18 18 17   Temp: 98 F (36.7 C) 98 F (36.7 C) 98.9 F (37.2 C) 97.6 F (36.4 C)  TempSrc: Oral   Oral  SpO2: 100% 100% 100% 100%  Weight:      Height:       Physical Exam General: Elderly, ill appearing man, NAD. [place] O2 in place. Heart: RRR; no murmur Lungs: CTA anteriorly Abdomen: soft Extremities: no LE edema Dialysis Access: [facility]  Additional Objective Labs: Basic Metabolic Panel: Recent Labs  Lab 08/25/24 0347 08/25/24 1006 08/26/24 0435 08/27/24 0533 08/28/24 0336 08/29/24 0457 08/30/24 0425  NA 137 136   < > 136 135 138 138  K 3.5 3.4*   < > 3.3* 3.6 3.8 3.9  CL 95* 96*   < > 95* 95* 98 97*  CO2 28 25   < > 26 29 26 27   GLUCOSE 86 96   < > 123* 156* 89 97  BUN 46* 50*   < > 43* 33* 39* 30*  CREATININE 3.56* 3.70*   < > 3.29* 2.64* 3.19* 2.70*  CALCIUM  8.3* 8.1*   < > 8.3* 8.1* 8.2* 8.2*  PHOS 4.5 4.5  --  4.0  --   --   --    < > = values in this interval not displayed.   Liver Function Tests: Recent Labs  Lab 08/24/24 0230 08/25/24 1006  AST <10*  --   ALT 11  --   ALKPHOS 116  --   BILITOT 1.2  --   PROT 5.9*  --   ALBUMIN 2.6* 2.6*   CBC: Recent Labs  Lab 08/26/24 0435 08/27/24 0533 08/28/24 0336 08/29/24 0457 08/30/24 0425  WBC 8.5 7.2 7.4 7.1 6.2  HGB 8.9* 9.4* 8.9* 8.8* 8.8*  HCT 27.2* 28.8* 27.4* 27.5* 28.2*  MCV 90.7 89.7 89.8 91.7 92.8  PLT 171 159 165 148* 147*   Studies/Results: EEG adult Result Date: 08/29/2024 Shelton Arlin KIDD, MD     08/29/2024  8:56 AM [name] MRN: [id number] Epilepsy Attending: Arlin KIDD Shelton Referring Physician/Provider: Khaliqdina, Salman, MD Date: 08/28/2024 Duration: 22.02 mins Patient history: 73yo M with an episode of unresponsiveness 15 minutes after a fall. EEG to evaluate for seizure  Level of alertness: Awake AEDs during EEG study: LEV  Technical aspects: This EEG study was done with scalp electrodes positioned according to the 10-20 International system of electrode placement. Electrical activity was reviewed with band pass filter of 1-70Hz , sensitivity of 7 uV/mm, display speed of 48mm/sec with a 60Hz  notched filter applied as appropriate. EEG data were recorded continuously and digitally stored.  Video monitoring was available and reviewed as appropriate.  Description: The posterior dominant rhythm consists of 8 Hz activity of moderate voltage (25-35 uV) seen predominantly in posterior head regions, symmetric and reactive to eye opening and eye closing. EEG showed continuous 3-5hz  theta- delta slowing in left centro-parietal region which at times. Intermittent generalized 3 to 6 Hz theta- delta slowing was also noted hyperventilation and photic stimulation were  not performed.    ABNORMALITY - Continuous slow, left centro- parietal region - Intermittent slow, generalized  IMPRESSION: This study is suggestive of cortical dysfunction arising from left centro- parietal region likely secondary to underlying structural abnormality.  Additionally there is mild diffuse encephalopathy. No seizures or definite epileptiform discharges were seen throughout the recording.  Priyanka MALVA Krebs   CT HEAD WO CONTRAST ( ) Result Date: 08/28/2024 CLINICAL DATA:  73 year old male with altered mental status comment cephalopathy. Recent shortness of breath, found down after a fall. EXAM: CT HEAD WITHOUT CONTRAST TECHNIQUE: Contiguous axial images were obtained from the base of the skull through the vertex without intravenous contrast. RADIATION DOSE  REDUCTION: This exam was performed according to the departmental dose-optimization program which includes automated exposure control, adjustment of the mA and/or kV according to patient size and/or use of iterative reconstruction technique. COMPARISON:  Brain MRI 08/23/2024.  Head CT 08/22/2024. FINDINGS: Brain: Posterior left hemisphere chronic encephalomalacia. Ex vacuo ventricular enlargement. Patchy and confluent bilateral cerebral white matter hypodensity. Small chronic right cerebellar infarct. Stable gray-white matter differentiation throughout the brain. No midline shift, ventriculomegaly, mass effect, evidence of mass lesion, intracranial hemorrhage or evidence of cortically based acute infarction. Vascular: Calcified atherosclerosis at the skull base. No suspicious intracranial vascular hyperdensity. Skull: Appears stable and intact. Sinuses/Orbits: Visualized paranasal sinuses and mastoids are stable and well aerated. Other: No acute orbit or scalp soft tissue finding. IMPRESSION: 1. No acute intracranial abnormality. 2. Continued Stable non contrast CT appearance of chronic ischemic disease. Electronically Signed   By: VEAR Hurst M.D.   On: 08/28/2024 11:39   Medications:   (feeding supplement) PROSource Plus  30 mL Oral BID   amLODipine   5 mg Oral Daily   aspirin   325 mg Oral Daily   atorvastatin   20 mg Oral q1800   carvedilol   25 mg Oral BID   Chlorhexidine  Gluconate Cloth  6 each Topical Q0600   ezetimibe   10 mg Oral Daily   ferrous sulfate   325 mg Oral Q breakfast   heparin   5,000 Units Subcutaneous Q8H   hydrALAZINE   25 mg Oral TID   Influenza vac split trivalent PF  0.5 mL Intramuscular Tomorrow-1000   insulin  aspart  0-6 Units Subcutaneous TID WC   levothyroxine   50 mcg Oral QAC breakfast   multivitamin with minerals  1 tablet Oral Daily   pantoprazole   40 mg Oral Daily   polyethylene glycol  17 g Oral Daily   senna-docusate  2 tablet Oral Daily   sodium bicarbonate   650 mg Oral  TID   sodium chloride  flush  10-40 mL Intracatheter Q12H   sodium chloride  flush  3 mL Intravenous Q12H   thiamine   100 mg Oral Daily    Dialysis Orders MWF - [facility] (AKI status) 4hrs, BFR 400, DFR 600,  EDW 94kg, 2K/ 2.5Ca, no heparin  *Noted he was getting an Fe load in outpatient   Assessment/Plan: AMS - Neurology following; head CT showed prior L sided, MCA territory CVA. MRI brain confirmed the same - prior CVA, nothing acute. EEG with slowed cortical dysfunction of L parietal region. Started on Keppra , but then overly sedated so stopped. This is not uremia. Per notes, he has been steadily declining and palliative care now involved. 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 UOP ranging 150-364mL - not the best. On HD since early 07/2024. Lack of muscle mass leading to low Cr - BUN/Cr still climb better HD sessions -  does not appear to be recovering. Continue HD on MWF schedule - next HD tomorrow. D/c PO bicarb. Hypertension/volume: BP remains high, but improving with HD and ^ amlodipine  dose. Follow.  Anemia of CKD - Hgb 8.8, tsat 32%.  No indication for IV iron. Will start ESA as outpatient. Secondary Hyperparathyroidism - CorrCa/Phos ok - no meds for now. Nutrition - Alb low, continue supplements. False + RPR, negative treponemal ab test. GOC: Palliative consult following. Technically has tolerated HD, but doubt if adding to his QOL. Ongoing discussions.   Izetta Boehringer, PA-C 08/30/2024, 11:34 AM  [employer]

## 2024-08-30 NOTE — Progress Notes (Signed)
 Physical Therapy Note  Robert Spears remains on PT caseload; Noted Palliative Medicine Consult, and that Robert Spears is choosing to bring pt home;   PT goals will shift more towards family and pt education for moving, positioning, and managing at home; If mobilizing OOB is Palliative in nature for Robert Spears, will continue to address those goals as well;     08/30/24 0900  PT Recommendation  Recommendations for Other Services Other (comment) (Noting Palliative Medicine consult)  Follow Up Recommendations  (Insurance has denied SNF stay for post-acute rehab; Pt's wife is choosing to bring him home; While HHPT/OT might be indicated, if pt/family chooses Hospice Services they will not likely need/have HH therapies)  Can patient physically be transported by private vehicle No  Patient can return home with the following A lot of help with walking and/or transfers;Two people to help with walking and/or transfers;A lot of help with bathing/dressing/bathroom  PT equipment Hospital bed;Wheelchair (measurements PT);Wheelchair cushion (measurements PT);Hoyer lift;Other (comment) (non-emergent ambulance transport home)   Will take Palliative Care's lead; If PT interventions are no longer congruent with pt and family's Goals of Care, will sign off;   Silvano Currier, PT  Acute Rehabilitation Services Office [phone] Secure Chat welcomed

## 2024-08-31 DIAGNOSIS — Z515 Encounter for palliative care: Secondary | ICD-10-CM | POA: Diagnosis not present

## 2024-08-31 DIAGNOSIS — R410 Disorientation, unspecified: Secondary | ICD-10-CM | POA: Diagnosis not present

## 2024-08-31 DIAGNOSIS — N189 Chronic kidney disease, unspecified: Secondary | ICD-10-CM | POA: Diagnosis not present

## 2024-08-31 DIAGNOSIS — G9340 Encephalopathy, unspecified: Secondary | ICD-10-CM | POA: Diagnosis not present

## 2024-08-31 LAB — BASIC METABOLIC PANEL WITH GFR
Anion gap: 12 (ref 5–15)
BUN: 42 mg/dL — ABNORMAL HIGH (ref 8–23)
CO2: 30 mmol/L (ref 22–32)
Calcium: 8.3 mg/dL — ABNORMAL LOW (ref 8.9–10.3)
Chloride: 93 mmol/L — ABNORMAL LOW (ref 98–111)
Creatinine, Ser: 3.42 mg/dL — ABNORMAL HIGH (ref 0.61–1.24)
GFR, Estimated: 18 mL/min — ABNORMAL LOW (ref >60)
Glucose, Bld: 138 mg/dL — ABNORMAL HIGH (ref 70–99)
Potassium: 4.4 mmol/L (ref 3.5–5.1)
Sodium: 135 mmol/L (ref 135–145)

## 2024-08-31 LAB — CBC
HCT: 27.2 % — ABNORMAL LOW (ref 39.0–52.0)
HCT: 27.8 % — ABNORMAL LOW (ref 39.0–52.0)
Hemoglobin: 8.6 g/dL — ABNORMAL LOW (ref 13.0–17.0)
Hemoglobin: 8.6 g/dL — ABNORMAL LOW (ref 13.0–17.0)
MCH: 29.7 pg (ref 26.0–34.0)
MCH: 29 pg (ref 26.0–34.0)
MCHC: 31.6 g/dL (ref 30.0–36.0)
MCHC: 30.9 g/dL (ref 30.0–36.0)
MCV: 93.8 fL (ref 80.0–100.0)
MCV: 93.6 fL (ref 80.0–100.0)
Platelets: 161 K/uL (ref 150–400)
Platelets: 162 K/uL (ref 150–400)
RBC: 2.9 MIL/uL — ABNORMAL LOW (ref 4.22–5.81)
RBC: 2.97 MIL/uL — ABNORMAL LOW (ref 4.22–5.81)
RDW: 17.7 % — ABNORMAL HIGH (ref 11.5–15.5)
RDW: 17.7 % — ABNORMAL HIGH (ref 11.5–15.5)
WBC: 6.7 K/uL (ref 4.0–10.5)
WBC: 6.8 K/uL (ref 4.0–10.5)
nRBC: 0 % (ref 0.0–0.2)
nRBC: 0 % (ref 0.0–0.2)

## 2024-08-31 LAB — GLUCOSE, CAPILLARY
Glucose-Capillary: 116 mg/dL — ABNORMAL HIGH (ref 70–99)
Glucose-Capillary: 108 mg/dL — ABNORMAL HIGH (ref 70–99)
Glucose-Capillary: 154 mg/dL — ABNORMAL HIGH (ref 70–99)
Glucose-Capillary: 157 mg/dL — ABNORMAL HIGH (ref 70–99)

## 2024-08-31 LAB — MAGNESIUM: Magnesium: 2.1 mg/dL (ref 1.7–2.4)

## 2024-08-31 MED ORDER — HEPARIN SODIUM (PORCINE) 1000 UNIT/ML IJ SOLN: 3800.0000 [IU] | Freq: Once | INTRAMUSCULAR | Status: DC

## 2024-08-31 MED ORDER — DARBEPOETIN ALFA 40 MCG/0.4ML IJ SOSY
40.0000 ug | PREFILLED_SYRINGE | INTRAMUSCULAR | Status: DC
  Administered 2024-08-31: 40 ug via SUBCUTANEOUS
  Filled 2024-08-31 (×2): qty 0.4

## 2024-08-31 MED ORDER — HEPARIN SODIUM (PORCINE) 1000 UNIT/ML IJ SOLN
INTRAMUSCULAR | Status: AC
  Filled 2024-08-31: qty 4

## 2024-08-31 NOTE — Progress Notes (Signed)
 Brought in patient from his room by HD nurses,alert and oriented to place and person.Consent verified.  Access used: Right hd catheter that worked well.Dressing on date.  Duration; 3 hour.  Uf goal: Met 500 ml,tolerated treatment.  Hand off to the patient's nurse ,back into his room with stable condition.

## 2024-08-31 NOTE — Plan of Care (Signed)
   Problem: Education: Goal: Ability to describe self-care measures that may prevent or decrease complications (Diabetes Survival Skills Education) will improve Outcome: Progressing Goal: Individualized Educational Video(s) Outcome: Progressing   Problem: Coping: Goal: Ability to adjust to condition or change in health will improve Outcome: Progressing

## 2024-08-31 NOTE — TOC Progression Note (Signed)
 Transition of Care [facility]) - Progression Note    Patient Details  Name: Robert Spears MRN: [id number] Date of Birth: [birth year]  Transition of Care [facility]) CM/SW Contact  Tom-Johnson, Gisselle Galvis Daphne, RN Phone Number: 08/31/2024, 10:28 AM  Clinical Narrative:     CM spoke with patient's wife, Robert Spears at bedside about discharge disposition. Robert Spears states she spoke with her family last night and they decided for patient to be discharged home with Hospice and will stop hemodialysis. States they want to make patient comfortable.CM spoke with Robert Spears about Hospice providers and she chose [facility]. Referral sent to Inocente Jacobs via secure chat with acceptance noted. MD and Renal Navigator notified.    CM will continue to follow and render compassionate support during this transitioning time.        Expected Discharge Plan: Skilled Nursing Facility Barriers to Discharge: Continued Medical Work up               Expected Discharge Plan and Services In-house Referral: Clinical Social Work     Living arrangements for the past 2 months: Skilled Nursing Facility                                       Social Drivers of Health (SDOH) Interventions SDOH Screenings   Food Insecurity: Low Risk  (07/15/2024)   Received from [facility]  Housing: Low Risk  (07/15/2024)   Received from [facility]  Transportation Needs: No Transportation Needs (07/15/2024)   Received from [facility]  Utilities: Patient Unable To Answer (08/23/2024)  Financial Resource Strain: Low Risk  (10/04/2018)   Received from [facility] visits prior to 02/14/2023.  Physical Activity: Inactive (10/04/2018)   Received from [facility] visits prior to 02/14/2023.  Social Connections: Patient Unable To Answer (08/23/2024)  Stress: No Stress Concern Present (10/04/2018)   Received from [facility] visits prior to 02/14/2023.  Tobacco Use:  Medium Risk (08/23/2024)    Readmission Risk Interventions     No data to display

## 2024-08-31 NOTE — Progress Notes (Signed)
 PROGRESS NOTE    Robert Spears  FMW:[id number] DOB: 1950-12-21 DOA: 08/22/2024 PCP: Ofilia Lamar CROME, MD    Brief Narrative:  73 year old with history of HTN, HLD, A-fib status post LAAO with watchman's device in 2021, CAD status post CABG, DM2, ICH 2021, cerebral amyloid angiopathy, CVA, hypothyroidism, chronic back pain comes to the hospital for being unresponsive for several minutes followed by right arm weakness.  Initially code stroke was called and neurology was consulted.  CT of the head showed large left-sided remote MCA territory CVA, MRI brain negative and EEG showed slow cortical dysfunction of the left central parietal region.  Briefly started on Keppra  but due to drowsiness it was discontinued. Currently ongoing discussions with palliative care services regarding goals of care.  Assessment & Plan:   Fall with subsequent episode of unresponsiveness Acute encephalopathy Concerns of possible seizure activity.Initially code stroke was called and neurology was consulted.  CT of the head showed large left-sided remote MCA territory CVA, MRI brain negative and EEG showed slow cortical dysfunction of the left central parietal region.  Briefly started on Keppra  but due to drowsiness this was discontinued.  Repeat EEG shows generalized slowing.  Patient Completed 5 days of high-dose IV thiamine  now on p.o. -Folate, B12, TSH, UA ammonia are normal - Zyprexa  and Keppra  discontinued due to drowsiness  RPR, reactive -Titer are 1:1. treponemal antibody test is negative.  This is likely false positive   Uncontrolled diabetes mellitus type 2  Uncontrolled, A1c 6.0.  Continue sliding scale and Accu-Chek but due to poor oral intake, discontinue Lantus    ESRD on HD Nephrology following.   Heart failure with preserved ejection fraction, EF 55% Stable.  Recent outpatient echo on 8/1 shows EF of 55%   Hypokalemia As needed repletion   Permanent atrial fibrillation  Currently rate  controlled. S/p Watchman device On Coreg  and aspirin    Anemia of chronic disease Hemoglobin remains around 8.5   CAD s/p CABG - Continue aspirin , statin, beta-blocker   Hypothyroidism - Continue levothyroxine   Extensive goals of care discussion with the family.  Will transition patient home with home health services with palliative care team service to follow this patient.  In near future I suspect patient will need to be transition to hospice. Will confirm he is DNR/DNI.    DVT prophylaxis: Heparin  DNR/DNI Advance Care Planning:     Consults: Neurology and nephrology   Status is: Inpatient Remains inpatient appropriate because:On going discussions with Palliative.    PT Follow up Recs: SNF was denied.  Will work on transitioning patient home with home health services and will have outpatient palliative care referral.  Subjective: Seen in HD no complaints.  Examination:  General exam: Appears calm and comfortable ; sluggish Respiratory system: Clear to auscultation. Respiratory effort normal. Cardiovascular system: S1 & S2 heard, RRR. No JVD, murmurs, rubs, gallops or clicks. No pedal edema. Gastrointestinal system: Abdomen is nondistended, soft and nontender. No organomegaly or masses felt. Normal bowel sounds heard. Central nervous system: Alert and oriented to name only. No focal neurological deficits. Extremities: Symmetric 4 x 5 power. Skin: No rashes, lesions or ulcers Psychiatry: Judgement and insight appear poor                Diet Orders (From admission, onward)     Start     Ordered   08/23/24 0934  Diet heart healthy/carb modified Room service appropriate? Yes; Fluid consistency: Thin  Diet effective now  Question Answer Comment  Diet-HS Snack? Nothing   Room service appropriate? Yes   Fluid consistency: Thin      08/23/24 0947            Objective: Vitals:   08/31/24 0838 08/31/24 0844 08/31/24 0900 08/31/24 0930  BP: 132/70   126/67 130/63  Pulse:      Resp:      Temp: 98.7 F (37.1 C)     TempSrc: Oral     SpO2:      Weight:  92 kg    Height:        Intake/Output Summary (Last 24 hours) at 08/31/2024 0952 Last data filed at 08/31/2024 0417 Gross per 24 hour  Intake 320 ml  Output 0 ml  Net 320 ml   Filed Weights   08/27/24 1245 08/29/24 0748 08/31/24 0844  Weight: 87.3 kg 94 kg 92 kg    Scheduled Meds:  (feeding supplement) PROSource Plus  30 mL Oral BID   amLODipine   5 mg Oral Daily   aspirin   325 mg Oral Daily   atorvastatin   20 mg Oral q1800   carvedilol   25 mg Oral BID   Chlorhexidine  Gluconate Cloth  6 each Topical Q0600   ezetimibe   10 mg Oral Daily   ferrous sulfate   325 mg Oral Q breakfast   heparin   5,000 Units Subcutaneous Q8H   hydrALAZINE   25 mg Oral TID   Influenza vac split trivalent PF  0.5 mL Intramuscular Tomorrow-1000   insulin  aspart  0-6 Units Subcutaneous TID WC   levothyroxine   50 mcg Oral QAC breakfast   multivitamin with minerals  1 tablet Oral Daily   pantoprazole   40 mg Oral Daily   polyethylene glycol  17 g Oral Daily   senna-docusate  2 tablet Oral Daily   sodium chloride  flush  10-40 mL Intracatheter Q12H   sodium chloride  flush  3 mL Intravenous Q12H   thiamine   100 mg Oral Daily   Continuous Infusions:  Nutritional status     Body mass index is 26.76 kg/m.  Data Reviewed:   CBC: Recent Labs  Lab 08/28/24 0336 08/29/24 0457 08/30/24 0425 08/31/24 0424 08/31/24 0733  WBC 7.4 7.1 6.2 6.7 6.8  HGB 8.9* 8.8* 8.8* 8.6* 8.6*  HCT 27.4* 27.5* 28.2* 27.2* 27.8*  MCV 89.8 91.7 92.8 93.8 93.6  PLT 165 148* 147* 161 162   Basic Metabolic Panel: Recent Labs  Lab 08/25/24 0347 08/25/24 1006 08/26/24 0435 08/27/24 0533 08/28/24 0336 08/29/24 0457 08/30/24 0425 08/31/24 0424  NA 137 136   < > 136 135 138 138 135  K 3.5 3.4*   < > 3.3* 3.6 3.8 3.9 4.4  CL 95* 96*   < > 95* 95* 98 97* 93*  CO2 28 25   < > 26 29 26 27 30   GLUCOSE 86 96   < >  123* 156* 89 97 138*  BUN 46* 50*   < > 43* 33* 39* 30* 42*  CREATININE 3.56* 3.70*   < > 3.29* 2.64* 3.19* 2.70* 3.42*  CALCIUM  8.3* 8.1*   < > 8.3* 8.1* 8.2* 8.2* 8.3*  MG 2.1  --    < > 2.0 2.0 2.0 2.0 2.1  PHOS 4.5 4.5  --  4.0  --   --   --   --    < > = values in this interval not displayed.   GFR: Estimated Creatinine Clearance: 21.4 mL/min (A) (by C-G formula based  on SCr of 3.42 mg/dL (H)). Liver Function Tests: Recent Labs  Lab 08/25/24 1006  ALBUMIN 2.6*   No results for input(s): LIPASE, AMYLASE in the last 168 hours. Recent Labs  Lab 08/24/24 1449  AMMONIA 20   Coagulation Profile: No results for input(s): INR, PROTIME in the last 168 hours. Cardiac Enzymes: No results for input(s): CKTOTAL, CKMB, CKMBINDEX, TROPONINI in the last 168 hours. BNP (last 3 results) No results for input(s): PROBNP in the last 8760 hours. HbA1C: No results for input(s): HGBA1C in the last 72 hours. CBG: Recent Labs  Lab 08/30/24 0743 08/30/24 1113 08/30/24 1616 08/30/24 1936 08/31/24 0808  GLUCAP 93 159* 145* 198* 116*   Lipid Profile: No results for input(s): CHOL, HDL, LDLCALC, TRIG, CHOLHDL, LDLDIRECT in the last 72 hours. Thyroid  Function Tests: No results for input(s): TSH, T4TOTAL, FREET4, T3FREE, THYROIDAB in the last 72 hours. Anemia Panel: No results for input(s): VITAMINB12, FOLATE, FERRITIN, TIBC, IRON, RETICCTPCT in the last 72 hours. Sepsis Labs: No results for input(s): PROCALCITON, LATICACIDVEN in the last 168 hours.  No results found for this or any previous visit (from the past 240 hours).       Radiology Studies: No results found.         LOS: 8 days   Time spent= 35 mins    Burgess JAYSON Dare, MD [facility]  If 7PM-7AM, please contact night-coverage  08/31/2024, 9:52 AM

## 2024-08-31 NOTE — Progress Notes (Signed)
 PT Cancellation Note  Patient Details Name: Robert Spears MRN: [id number] DOB: 10-24-1951   Cancelled Treatment:    Reason Eval/Treat Not Completed: Patient at procedure or test/unavailable Pt noted to be OTF at dialysis at this time. Will f/u as able.  Richerd Pinal, PT, DPT 08/31/24, 9:13 AM   [name] 08/31/2024, 9:13 AM

## 2024-08-31 NOTE — Progress Notes (Signed)
 Advised by staff that pt's family has opted to take pt home with hospice and to stop HD. Contacted [facility] to be made aware of this info.   Randine Mungo Dialysis Navigator [phone]

## 2024-08-31 NOTE — Progress Notes (Addendum)
 [facility] Progress Note   Subjective:  Seen on HD - 0.5L UFG and tolerating. Awake and more interactive today. Denies CP/dyspnea. Appears plan will be discharge home with [facility] and HD until he gets to the point where family cannot take care of him and then to hospice?   Objective Vitals:   08/31/24 0844 08/31/24 0900 08/31/24 0930 08/31/24 1000  BP:  126/67 130/63 128/62  Pulse:    94  Resp:    (!) 22  Temp:      TempSrc:      SpO2:    100%  Weight: 92 kg     Height:       Physical Exam General: Elderly, ill appearing man, NAD. [place] O2 in place. Heart: RRR; no murmur Lungs: CTA anteriorly Abdomen: soft Extremities: no LE edema Dialysis Access: [facility]  Additional Objective Labs: Basic Metabolic Panel: Recent Labs  Lab 08/25/24 0347 08/25/24 1006 08/26/24 0435 08/27/24 0533 08/28/24 0336 08/29/24 0457 08/30/24 0425 08/31/24 0424  NA 137 136   < > 136   < > 138 138 135  K 3.5 3.4*   < > 3.3*   < > 3.8 3.9 4.4  CL 95* 96*   < > 95*   < > 98 97* 93*  CO2 28 25   < > 26   < > 26 27 30   GLUCOSE 86 96   < > 123*   < > 89 97 138*  BUN 46* 50*   < > 43*   < > 39* 30* 42*  CREATININE 3.56* 3.70*   < > 3.29*   < > 3.19* 2.70* 3.42*  CALCIUM  8.3* 8.1*   < > 8.3*   < > 8.2* 8.2* 8.3*  PHOS 4.5 4.5  --  4.0  --   --   --   --    < > = values in this interval not displayed.   Liver Function Tests: Recent Labs  Lab 08/25/24 1006  ALBUMIN 2.6*   CBC: Recent Labs  Lab 08/28/24 0336 08/29/24 0457 08/30/24 0425 08/31/24 0424 08/31/24 0733  WBC 7.4 7.1 6.2 6.7 6.8  HGB 8.9* 8.8* 8.8* 8.6* 8.6*  HCT 27.4* 27.5* 28.2* 27.2* 27.8*  MCV 89.8 91.7 92.8 93.8 93.6  PLT 165 148* 147* 161 162   Medications:   (feeding supplement) PROSource Plus  30 mL Oral BID   amLODipine   5 mg Oral Daily   aspirin   325 mg Oral Daily   atorvastatin   20 mg Oral q1800   carvedilol   25 mg Oral BID   Chlorhexidine  Gluconate Cloth  6 each Topical Q0600   ezetimibe   10 mg Oral  Daily   ferrous sulfate   325 mg Oral Q breakfast   heparin   5,000 Units Subcutaneous Q8H   hydrALAZINE   25 mg Oral TID   Influenza vac split trivalent PF  0.5 mL Intramuscular Tomorrow-1000   insulin  aspart  0-6 Units Subcutaneous TID WC   levothyroxine   50 mcg Oral QAC breakfast   multivitamin with minerals  1 tablet Oral Daily   pantoprazole   40 mg Oral Daily   polyethylene glycol  17 g Oral Daily   senna-docusate  2 tablet Oral Daily   sodium chloride  flush  10-40 mL Intracatheter Q12H   sodium chloride  flush  3 mL Intravenous Q12H   thiamine   100 mg Oral Daily    Dialysis Orders MWF - [facility] (AKI status) 4hrs, BFR 400, DFR 600,  EDW 94kg, 2K/ 2.5Ca, no heparin  *Noted he was getting an Fe load in outpatient   Assessment/Plan: AMS - Neurology following; head CT showed prior L sided, MCA territory CVA. MRI brain confirmed the same - prior CVA, nothing acute. EEG with slowed cortical dysfunction of L parietal region. Started on Keppra , but then overly sedated so stopped. This is not uremia. Per notes, he has been steadily declining and palliative care now involved. 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 UOP ranging 150-363mL - not the best. On HD since early 07/2024. Lack of muscle mass leading to low Cr - BUN/Cr still climb better HD sessions - does not appear to be recovering. Continue HD on MWF schedule - HD now, 0.5L UFG. Hypertension/volume: BP much better with HD and ^ amlodipine  dose. Follow.  Anemia of CKD - Hgb 8.6, tsat 32%.  No indication for IV iron. Will start ESA, perhaps ^ Hgb will give him a little more energy. Secondary Hyperparathyroidism - CorrCa/Phos ok - no meds for now. Nutrition - Alb low, continue supplements. False + RPR, negative treponemal ab test. GOC: Palliative consult following, now DNR. Technically has tolerated HD, but doubt if adding to his QOL. Appears family wants him to come home with [facility], although there is concern that they will  be able to handle all of his needs. If this turns out to be the case, appears plan will be hospice.  ADDENDUM: Just informed by primary team that plan is for him to go home with hospice and he will be stopping HD. He is on HD now - has 1 hour left. Will finish this session and then no further HD planned.   Izetta Boehringer, PA-C 08/31/2024, 10:17 AM  [employer]

## 2024-08-31 NOTE — Progress Notes (Signed)
 [facility] Liaison Note Received request from [facility], Marcella Tom-Johnson, for hospice services at home after discharge.  Spoke with spouse Avelina to initiate education related to hospice philosophy, services, and team approach to care.  Family verbalized understanding of information given.  Per discussion, the plan for discharge home by PTAR/EMS Thursday 9.18 or Friday 9.19.  DME needs discussed.  Patient does not have DME in the home. Family requests the following equipment for delivery: hospital bed and obt, APP mattress topper, wheelchair and oxygen concentrator for supplemental oxygen. The address has been verified and is correct in the chart.  Avelina, [phone] is the family contact to arrange time of equipment delivery.  Please send signed and completed DNR home with patient/family.  Please provide prescriptions at discharge as needed to ensure ongoing symptom management.  [facility] information and contact numbers have been given to [employer].  Above information share with Harvest Emory, TOC manager.  Please call with any questions or concerns.  Thank you for the opportunity to participate in this patient's care.  Inocente Jacobs BSN [employer] [phone]

## 2024-08-31 NOTE — TOC Progression Note (Addendum)
 Transition of Care [facility]) - Progression Note    Patient Details  Name: Robert Spears MRN: [id number] Date of Birth: 01/28/1951  Transition of Care [facility]) CM/SW Contact  Lendia Dais, [place] Phone Number: 08/31/2024, 8:37 AM  Clinical Narrative: Patient is disoriented x2.  CSW spoke to wife Avelina at bedside about discharging to patient home. CSW asked if she would have adequate support at home due. Avelina stated that she has her children, family, and church community to assist with his needs at home. CSW stated that they wanted to follow up to make sure that they understanding of what it meant for them to take the patient home. Avelina stated understanding and expressed gratitude.  CSW will follow for discharge needs.    Expected Discharge Plan: Skilled Nursing Facility Barriers to Discharge: Continued Medical Work up               Expected Discharge Plan and Services In-house Referral: Clinical Social Work     Living arrangements for the past 2 months: Skilled Nursing Facility                                       Social Drivers of Health (SDOH) Interventions SDOH Screenings   Food Insecurity: Low Risk  (07/15/2024)   Received from [facility]  Housing: Low Risk  (07/15/2024)   Received from [facility]  Transportation Needs: No Transportation Needs (07/15/2024)   Received from [facility]  Utilities: Patient Unable To Answer (08/23/2024)  Financial Resource Strain: Low Risk  (10/04/2018)   Received from [facility] visits prior to 02/14/2023.  Physical Activity: Inactive (10/04/2018)   Received from [facility] visits prior to 02/14/2023.  Social Connections: Patient Unable To Answer (08/23/2024)  Stress: No Stress Concern Present (10/04/2018)   Received from [facility] visits prior to 02/14/2023.  Tobacco Use: Medium Risk (08/23/2024)    Readmission Risk Interventions     No data to  display

## 2024-08-31 NOTE — Progress Notes (Signed)
 OT Cancellation Note  Patient Details Name: Robert Spears MRN: [id number] DOB: June 05, 1951   Cancelled Treatment:    Reason Eval/Treat Not Completed: Patient at procedure or test/ unavailable. Pt off unit at HD, OT will follow up next available time  Jacques Karna Loose 08/31/2024, 9:40 AM

## 2024-08-31 NOTE — Progress Notes (Signed)
 OT Cancellation Note  Patient Details Name: Robert Spears MRN: [id number] DOB: Dec 29, 1950   Cancelled Treatment:    Reason Eval/Treat Not Completed: Other (comment). Per chart, pt/family planning for pt to d/c home with hospice with focus on pt's comfort. Per MD, can d/c therapy orders at this time.   Jacques Aquas [facility] 08/31/2024, 12:48 PM

## 2024-08-31 NOTE — Progress Notes (Signed)
 PT Cancellation Note  Patient Details Name: Kendryck Lacroix MRN: [id number] DOB: 1951-06-22   Cancelled Treatment:    Reason Eval/Treat Not Completed: Other (comment) Per chart, pt/family planning for pt to d/c home with hospice with focus on pt's comfort. Per MD, can d/c therapy orders at this time.  Richerd Pinal, PT, DPT 08/31/24, 12:46 PM   [name] 08/31/2024, 12:45 PM

## 2024-08-31 NOTE — Progress Notes (Signed)
 Palliative Care Progress Note, Assessment & Plan   [name]       Date: 08/31/2024 DOB: 01-14-1951  Age: 73 y.o. MRN#: [id number] Attending Physician: Caleen Burgess BROCKS, MD Primary Care Physician: Ofilia Lamar CROME, MD Admit Date: 08/22/2024  Subjective: Patient is lying in bed, asleep, but easily awakens to my presence.  Once awake, he makes eye contact with me and says hello.  However, he makes no further attempts to engage in discussions.  His wife Avelina is at bedside during my visit.  Entirety of discussion was held with Avelina.  HPI: 73 y.o. male  with past medical history of HTN, HLD, A-fib status post LAAO with watchman's device in 2021, CAD status post CABG, DM2, ICH 2021, cerebral amyloid angiopathy, CVA, hypothyroidism, chronic back pain  admitted on 08/22/2024 after being found unresponsive for several minutes followed by right arm weakness at his facility ([name]).   Of note, patient was hospitalized 7/21 - 8/28 at [facility] and then discharged to SNF for rehab.   Code stroke was initiated and neurology was consulted.  CT of the head revealed large left-sided remote MCA territory CVA, MRI of the brain was negative, and EEG revealed slow cortical dysfunction of the left central parietal region.  Patient was started on Keppra  but due to drowsiness it was discontinued.   Patient is being treated for a fall with subsequent episode of unresponsiveness, acute encephalopathy, uncontrolled diabetes, ESRD (HD), and hypokalemia.   PMT was consulted to support patient and family with goals of care discussions.  Summary of counseling/coordination of care: Extensive chart review completed prior to meeting patient including labs, vital signs, imaging, progress notes, orders,  and available advanced directive documents from current and previous encounters.   After reviewing the patient's chart and assessing the patient at bedside, I spoke with patient and his wife Avelina in regards to symptom management and goals of care.   Avelina confirms that she wishes to take the patient home with hospice services.  She has already met with the [facility] care liaison and they are working for DME equipment to be delivered to the home.  TOC and hospice liaison following closely for discharge planning.  Discussed hospice services, aging in place, aggressive symptom management, and use of 24/7 RN hotline for support while at home.  We also discussed that should patient's symptoms become to complex then patient can be evaluated for transfer to hospice inpatient unit.  Avelina shares that patient has been in and out of so many hospitals lately that she is hopefully going to be able to keep him home.  Therapeutic silence, active listening, and emotional support provided.  Plan remains for patient to discharge home with hospice services tomorrow.  No further acute palliative needs.  Avelina has PMT contact info and was advised to reach out to PMT should acute palliative needs arise before discharge tomorrow.  Physical Exam Vitals reviewed.  Constitutional:      General: He is not in acute distress.    Appearance: He is normal weight.  HENT:     Mouth/Throat:     Mouth: Mucous membranes are moist.  Eyes:  Pupils: Pupils are equal, round, and reactive to light.  Cardiovascular:     Pulses: Normal pulses.  Pulmonary:     Effort: Pulmonary effort is normal.  Abdominal:     Palpations: Abdomen is soft.  Musculoskeletal:     Comments: Generalized weakness  Skin:    General: Skin is warm and dry.  Neurological:     Mental Status: He is alert.  Psychiatric:        Mood and Affect: Mood normal.        Behavior: Behavior normal.             Visit includes: Detailed review  of medical records (labs, imaging, vital signs), medically appropriate exam (mental status, respiratory, cardiac, skin), discussed with treatment team, counseling and educating patient, family and staff, documenting clinical information, medication management and coordination of care.  Lamarr L. Arvid, DNP, FNP-BC Palliative Medicine Team

## 2024-09-01 ENCOUNTER — Inpatient Hospital Stay (HOSPITAL_COMMUNITY)

## 2024-09-01 DIAGNOSIS — G9340 Encephalopathy, unspecified: Secondary | ICD-10-CM | POA: Diagnosis not present

## 2024-09-01 LAB — BASIC METABOLIC PANEL WITH GFR
Anion gap: 12 (ref 5–15)
BUN: 36 mg/dL — ABNORMAL HIGH (ref 8–23)
CO2: 28 mmol/L (ref 22–32)
Calcium: 8 mg/dL — ABNORMAL LOW (ref 8.9–10.3)
Chloride: 95 mmol/L — ABNORMAL LOW (ref 98–111)
Creatinine, Ser: 2.8 mg/dL — ABNORMAL HIGH (ref 0.61–1.24)
GFR, Estimated: 23 mL/min — ABNORMAL LOW (ref >60)
Glucose, Bld: 120 mg/dL — ABNORMAL HIGH (ref 70–99)
Potassium: 4.5 mmol/L (ref 3.5–5.1)
Sodium: 135 mmol/L (ref 135–145)

## 2024-09-01 LAB — MAGNESIUM: Magnesium: 1.9 mg/dL (ref 1.7–2.4)

## 2024-09-01 LAB — GLUCOSE, CAPILLARY
Glucose-Capillary: 184 mg/dL — ABNORMAL HIGH (ref 70–99)
Glucose-Capillary: 102 mg/dL — ABNORMAL HIGH (ref 70–99)
Glucose-Capillary: 109 mg/dL — ABNORMAL HIGH (ref 70–99)
Glucose-Capillary: 114 mg/dL — ABNORMAL HIGH (ref 70–99)

## 2024-09-01 MED ORDER — LIDOCAINE-EPINEPHRINE 1 %-1:100000 IJ SOLN
INTRAMUSCULAR | Status: AC
  Filled 2024-09-01: qty 1

## 2024-09-01 NOTE — Progress Notes (Signed)
 PROGRESS NOTE    Robert Spears  FMW:[id number] DOB: 07-04-1951 DOA: 08/22/2024 PCP: Ofilia Lamar CROME, MD    Brief Narrative:  73 year old with history of HTN, HLD, A-fib status post LAAO with watchman's device in 2021, CAD status post CABG, DM2, ICH 2021, cerebral amyloid angiopathy, CVA, hypothyroidism, chronic back pain comes to the hospital for being unresponsive for several minutes followed by right arm weakness.  Initially code stroke was called and neurology was consulted.  CT of the head showed large left-sided remote MCA territory CVA, MRI brain negative and EEG showed slow cortical dysfunction of the left central parietal region.  Briefly started on Keppra  but due to drowsiness it was discontinued. Eventually due to no improvement in his condition palliative care team was consulted.  After prolonged discussion with the patient's family members, he was transition to hospice/comfort.  Arrangements at home were made.  Assessment & Plan:   Fall with subsequent episode of unresponsiveness Acute encephalopathy Concerns of possible seizure activity.Initially code stroke was called and neurology was consulted.  CT of the head showed large left-sided remote MCA territory CVA, MRI brain negative and EEG showed slow cortical dysfunction of the left central parietal region.  Briefly started on Keppra  but due to drowsiness this was discontinued.  Repeat EEG shows generalized slowing.  Patient Completed 5 days of high-dose IV thiamine  now on p.o. -Folate, B12, TSH, UA ammonia are normal - Zyprexa  and Keppra  discontinued due to drowsiness  RPR, reactive -Titer are 1:1. treponemal antibody test is negative.  This is likely false positive   Uncontrolled diabetes mellitus type 2  Uncontrolled, A1c 6.0.  Continue sliding scale and Accu-Chek but due to poor oral intake, discontinue Lantus    ESRD on HD Nephrology following.   Heart failure with preserved ejection fraction, EF 55% Stable.  Recent  outpatient echo on 8/1 shows EF of 55%   Hypokalemia As needed repletion   Permanent atrial fibrillation  Currently rate controlled. S/p Watchman device On Coreg  and aspirin    Anemia of chronic disease Hemoglobin remains around 8.5   CAD s/p CABG - Continue aspirin , statin, beta-blocker   Hypothyroidism - Continue levothyroxine   Extensive goals of care discussion with the family.  Will transition patient home with home health services with palliative care team service to follow this patient.  In near future I suspect patient will need to be transition to hospice. Will confirm he is DNR/DNI.    DVT prophylaxis: Heparin  DNR/DNI Advance Care Planning:     Consults: Neurology and nephrology   Status is: Inpatient Remains inpatient appropriate because:On going discussions with Palliative.   Plans to transition home with home hospice  Subjective:  Doing ok no complaints.  Examination:  General exam: Appears calm and comfortable ; sluggish Respiratory system: Clear to auscultation. Respiratory effort normal. Cardiovascular system: S1 & S2 heard, RRR. No JVD, murmurs, rubs, gallops or clicks. No pedal edema. Gastrointestinal system: Abdomen is nondistended, soft and nontender. No organomegaly or masses felt. Normal bowel sounds heard. Central nervous system: Alert and oriented to name only. No focal neurological deficits. Extremities: Symmetric 4 x 5 power. Skin: No rashes, lesions or ulcers Psychiatry: Judgement and insight appear poor                Diet Orders (From admission, onward)     Start     Ordered   08/23/24 0934  Diet heart healthy/carb modified Room service appropriate? Yes; Fluid consistency: Thin  Diet effective now  Question Answer Comment  Diet-HS Snack? Nothing   Room service appropriate? Yes   Fluid consistency: Thin      08/23/24 0947            Objective: Vitals:   08/31/24 1626 08/31/24 2034 09/01/24 0412 09/01/24 0802   BP: 136/66 135/65 124/63 (!) 146/75  Pulse: 81 84 75 92  Resp: 19 19 18 17   Temp: 98.4 F (36.9 C) 98.8 F (37.1 C) 98.4 F (36.9 C) 98 F (36.7 C)  TempSrc:  Oral Oral   SpO2: 99% 97% 97% 99%  Weight:      Height:        Intake/Output Summary (Last 24 hours) at 09/01/2024 1154 Last data filed at 09/01/2024 0802 Gross per 24 hour  Intake 1140 ml  Output 800 ml  Net 340 ml   Filed Weights   08/29/24 0748 08/31/24 0844 08/31/24 1235  Weight: 94 kg 92 kg 91.5 kg    Scheduled Meds:  (feeding supplement) PROSource Plus  30 mL Oral BID   amLODipine   5 mg Oral Daily   aspirin   325 mg Oral Daily   atorvastatin   20 mg Oral q1800   carvedilol   25 mg Oral BID   Chlorhexidine  Gluconate Cloth  6 each Topical Q0600   darbepoetin (ARANESP ) injection - DIALYSIS  40 mcg Subcutaneous Q Wed-1800   ezetimibe   10 mg Oral Daily   ferrous sulfate   325 mg Oral Q breakfast   heparin   5,000 Units Subcutaneous Q8H   heparin  sodium (porcine)  3,800 Units Intracatheter Once   hydrALAZINE   25 mg Oral TID   Influenza vac split trivalent PF  0.5 mL Intramuscular Tomorrow-1000   insulin  aspart  0-6 Units Subcutaneous TID WC   levothyroxine   50 mcg Oral QAC breakfast   multivitamin with minerals  1 tablet Oral Daily   pantoprazole   40 mg Oral Daily   polyethylene glycol  17 g Oral Daily   senna-docusate  2 tablet Oral Daily   sodium chloride  flush  10-40 mL Intracatheter Q12H   sodium chloride  flush  3 mL Intravenous Q12H   thiamine   100 mg Oral Daily   Continuous Infusions:  Nutritional status     Body mass index is 26.61 kg/m.  Data Reviewed:   CBC: Recent Labs  Lab 08/28/24 0336 08/29/24 0457 08/30/24 0425 08/31/24 0424 08/31/24 0733  WBC 7.4 7.1 6.2 6.7 6.8  HGB 8.9* 8.8* 8.8* 8.6* 8.6*  HCT 27.4* 27.5* 28.2* 27.2* 27.8*  MCV 89.8 91.7 92.8 93.8 93.6  PLT 165 148* 147* 161 162   Basic Metabolic Panel: Recent Labs  Lab 08/27/24 0533 08/28/24 0336 08/29/24 0457  08/30/24 0425 08/31/24 0424 09/01/24 0340  NA 136 135 138 138 135 135  K 3.3* 3.6 3.8 3.9 4.4 4.5  CL 95* 95* 98 97* 93* 95*  CO2 26 29 26 27 30 28   GLUCOSE 123* 156* 89 97 138* 120*  BUN 43* 33* 39* 30* 42* 36*  CREATININE 3.29* 2.64* 3.19* 2.70* 3.42* 2.80*  CALCIUM  8.3* 8.1* 8.2* 8.2* 8.3* 8.0*  MG 2.0 2.0 2.0 2.0 2.1 1.9  PHOS 4.0  --   --   --   --   --    GFR: Estimated Creatinine Clearance: 26.2 mL/min (A) (by C-G formula based on SCr of 2.8 mg/dL (H)). Liver Function Tests: No results for input(s): AST, ALT, ALKPHOS, BILITOT, PROT, ALBUMIN in the last 168 hours. No results for input(s): LIPASE, AMYLASE in the  last 168 hours. No results for input(s): AMMONIA in the last 168 hours. Coagulation Profile: No results for input(s): INR, PROTIME in the last 168 hours. Cardiac Enzymes: No results for input(s): CKTOTAL, CKMB, CKMBINDEX, TROPONINI in the last 168 hours. BNP (last 3 results) No results for input(s): PROBNP in the last 8760 hours. HbA1C: No results for input(s): HGBA1C in the last 72 hours. CBG: Recent Labs  Lab 08/31/24 0808 08/31/24 1246 08/31/24 1628 08/31/24 2035 09/01/24 0838  GLUCAP 116* 108* 154* 157* 184*   Lipid Profile: No results for input(s): CHOL, HDL, LDLCALC, TRIG, CHOLHDL, LDLDIRECT in the last 72 hours. Thyroid  Function Tests: No results for input(s): TSH, T4TOTAL, FREET4, T3FREE, THYROIDAB in the last 72 hours. Anemia Panel: No results for input(s): VITAMINB12, FOLATE, FERRITIN, TIBC, IRON, RETICCTPCT in the last 72 hours. Sepsis Labs: No results for input(s): PROCALCITON, LATICACIDVEN in the last 168 hours.  No results found for this or any previous visit (from the past 240 hours).       Radiology Studies: No results found.         LOS: 9 days   Time spent= 35 mins    Burgess JAYSON Dare, MD [facility]  If 7PM-7AM, please contact  night-coverage  09/01/2024, 11:54 AM

## 2024-09-01 NOTE — Progress Notes (Signed)
 Pt more lethargic this evening.  Will awaken to verbal stimuli but will not sustain awakening long enough to safely take PO meds.  MD notified.  VSS. BG=WDL.

## 2024-09-01 NOTE — Progress Notes (Signed)
 Attempted to return wife's call. No answer.

## 2024-09-01 NOTE — Progress Notes (Signed)
 Plan is now to transition home w/ home hospice. No further dialysis. Will sign off.  Myer Fret  MD  [facility] 09/01/2024, 4:58 PM

## 2024-09-02 DIAGNOSIS — G9340 Encephalopathy, unspecified: Secondary | ICD-10-CM | POA: Diagnosis not present

## 2024-09-02 LAB — BASIC METABOLIC PANEL WITH GFR
Anion gap: 11 (ref 5–15)
BUN: 47 mg/dL — ABNORMAL HIGH (ref 8–23)
CO2: 29 mmol/L (ref 22–32)
Calcium: 8.2 mg/dL — ABNORMAL LOW (ref 8.9–10.3)
Chloride: 97 mmol/L — ABNORMAL LOW (ref 98–111)
Creatinine, Ser: 3.4 mg/dL — ABNORMAL HIGH (ref 0.61–1.24)
GFR, Estimated: 18 mL/min — ABNORMAL LOW (ref >60)
Glucose, Bld: 115 mg/dL — ABNORMAL HIGH (ref 70–99)
Potassium: 4.3 mmol/L (ref 3.5–5.1)
Sodium: 137 mmol/L (ref 135–145)

## 2024-09-02 LAB — GLUCOSE, CAPILLARY
Glucose-Capillary: 113 mg/dL — ABNORMAL HIGH (ref 70–99)
Glucose-Capillary: 191 mg/dL — ABNORMAL HIGH (ref 70–99)

## 2024-09-02 LAB — MAGNESIUM: Magnesium: 2 mg/dL (ref 1.7–2.4)

## 2024-09-02 MED ORDER — OLANZAPINE 2.5 MG PO TABS: 2.5000 mg | ORAL_TABLET | Freq: Every evening | ORAL | 0 refills | Status: DC | PRN

## 2024-09-02 NOTE — Progress Notes (Signed)
 DISCHARGE NOTE HOME Gery Randale Carvalho to be discharged Home per MD order. Discussed prescriptions and follow up appointments with the patient. Prescriptions given to patient; medication list explained in detail. Patient verbalized understanding.  Skin clean, dry and intact without evidence of skin break down, no evidence of skin tears noted. IV catheter discontinued intact. Site without signs and symptoms of complications. Dressing and pressure applied. Pt denies pain at the site currently. No complaints noted.  Patient free of lines, drains, and wounds.   An After Visit Summary (AVS) was printed and given to the patient. Patient escorted via stretcher via [facility]. Hospice Care. Wife at home and expecting pt. .  Nikholas Geffre A Proctor-Gann, RN

## 2024-09-02 NOTE — TOC Transition Note (Signed)
 Transition of Care [facility]) - Discharge Note   Patient Details  Name: Robert Spears MRN: [id number] Date of Birth: Jun 14, 1951  [facility]) CM/SW Contact:  Tom-Johnson, Victoriana Aziz Daphne, RN Phone Number: 09/02/2024, 11:02 AM   Clinical Narrative:     Patient is scheduled for discharge today.  Readmission Risk Assessment done. Hospice info, hospital f/u and discharge instructions on AVS. [facility] scheduled to transport at discharge.  No further ICM needs noted.       Final next level of care: Home w Hospice Care Barriers to Discharge: Barriers Resolved   Patient Goals and CMS Choice Patient states their goals for this hospitalization and ongoing recovery are:: To return home CMS Medicare.gov Compare Post Acute Care list provided to:: Patient Choice offered to / list presented to : Patient, Spouse      Discharge Placement                Patient to be transferred to facility by: [facility]      Discharge Plan and Services Additional resources added to the After Visit Summary for   In-house Referral: Clinical Social Work              DME Arranged: N/A DME Agency: NA       HH Arranged: NA HH Agency: NA        Social Drivers of Health (SDOH) Interventions SDOH Screenings   Food Insecurity: Low Risk  (07/15/2024)   Received from [facility]  Housing: Low Risk  (07/15/2024)   Received from [facility]  Transportation Needs: No Transportation Needs (07/15/2024)   Received from [facility]  Utilities: Patient Unable To Answer (08/23/2024)  Financial Resource Strain: Low Risk  (10/04/2018)   Received from [facility] visits prior to 02/14/2023.  Physical Activity: Inactive (10/04/2018)   Received from [facility] visits prior to 02/14/2023.  Social Connections: Patient Unable To Answer (08/23/2024)  Stress: No Stress Concern Present (10/04/2018)   Received from [facility] visits prior to  02/14/2023.  Tobacco Use: Medium Risk (08/23/2024)     Readmission Risk Interventions    09/02/2024   11:01 AM  Readmission Risk Prevention Plan  Transportation Screening Complete  Medication Review (RN Care Manager) Referral to Pharmacy  PCP or Specialist appointment within 3-5 days of discharge Complete  HRI or Home Care Consult Complete  SW Recovery Care/Counseling Consult Complete  Palliative Care Screening Not Applicable  Skilled Nursing Facility Not Applicable

## 2024-09-02 NOTE — Discharge Summary (Signed)
 Physician Discharge Summary  Robert Spears FMW:[id number] DOB: 02/25/1951 DOA: 08/22/2024  PCP: Ofilia Lamar CROME, MD  Admit date: 08/22/2024 Discharge date: 09/02/2024  Admitted From: Home Disposition: Home with hospice  Recommendations for Outpatient Follow-up:  Transitioning patient home with hospice No further dialysis Advised family to discontinue any routine oral medications if they feel appropriate going forward.   Discharge Condition: Stable CODE STATUS: DNR Diet recommendation: Comfort Feeds.   Brief/Interim Summary: Brief Narrative:  73 year old with history of HTN, HLD, A-fib status post LAAO with watchman's device in 2021, CAD status post CABG, DM2, ICH 2021, cerebral amyloid angiopathy, CVA, hypothyroidism, chronic back pain comes to the hospital for being unresponsive for several minutes followed by right arm weakness.  Initially code stroke was called and neurology was consulted.  CT of the head showed large left-sided remote MCA territory CVA, MRI brain negative and EEG showed slow cortical dysfunction of the left central parietal region.  Briefly started on Keppra  but due to drowsiness it was discontinued. Eventually due to no improvement in his condition palliative care team was consulted.  After prolonged discussion with the patient's family members, he was transition to hospice/comfort.  Arrangements at home were made.  Will discharge the patient today.  Assessment & Plan:   Fall with subsequent episode of unresponsiveness Acute encephalopathy Concerns of possible seizure activity.Initially code stroke was called and neurology was consulted.  CT of the head showed large left-sided remote MCA territory CVA, MRI brain negative and EEG showed slow cortical dysfunction of the left central parietal region.  Briefly started on Keppra  but due to drowsiness this was discontinued.  Repeat EEG shows generalized slowing.  Patient Completed 5 days of high-dose IV thiamine  now on  p.o. -Folate, B12, TSH, UA ammonia are normal  RPR, reactive -Titer are 1:1. treponemal antibody test is negative.  This is likely false positive   Uncontrolled diabetes mellitus type 2  Uncontrolled, A1c 6.0.  Continue sliding scale and Accu-Chek but due to poor oral intake, discontinue Lantus    ESRD on HD Nephrology following.  Dialysis catheter removed 9/18 as patient is now comfort care.   Heart failure with preserved ejection fraction, EF 55% Stable.  Recent outpatient echo on 8/1 shows EF of 55%   Hypokalemia As needed repletion   Permanent atrial fibrillation  Currently rate controlled. S/p Watchman device On Coreg  and aspirin    Anemia of chronic disease Hemoglobin remains around 8.5   CAD s/p CABG - Continue aspirin , statin, beta-blocker   Hypothyroidism - Continue levothyroxine   Extensive goals of care discussion with the family.  Will transition patient home with home health services with palliative care team service to follow this patient.  In near future I suspect patient will need to be transition to hospice. Will confirm he is DNR/DNI.    DVT prophylaxis: Heparin  DNR/DNI Advance Care Planning:     Consults: Neurology and nephrology   Transition to home with hospice.  Family given option to discontinue any medications going forward as they would like.  Subjective:  Doing ok no complaints.  Eating his breakfast this morning. Examination:  General exam: Appears calm and comfortable ; sluggish Respiratory system: Clear to auscultation. Respiratory effort normal. Cardiovascular system: S1 & S2 heard, RRR. No JVD, murmurs, rubs, gallops or clicks. No pedal edema. Gastrointestinal system: Abdomen is nondistended, soft and nontender. No organomegaly or masses felt. Normal bowel sounds heard. Central nervous system: Alert and oriented to name only. No focal neurological deficits. Extremities: Symmetric 4  x 5 power. Skin: No rashes, lesions or  ulcers Psychiatry: Judgement and insight appear poor    Discharge Diagnoses:  Principal Problem:   Acute encephalopathy Active Problems:   Fall   Uncontrolled type 2 diabetes mellitus with hyperglycemia, without long-term current use of insulin  (HCC)   ESRD on dialysis (HCC)   Heart failure with preserved ejection fraction (HCC)   Hyponatremia   Hypokalemia   Permanent atrial fibrillation (HCC)   Anemia due to chronic kidney disease   Coronary artery disease   Hypothyroidism      Discharge Exam: Vitals:   09/02/24 0451 09/02/24 0730  BP: (!) 140/72 (!) 142/75  Pulse: 69 79  Resp:  16  Temp: (!) 97.5 F (36.4 C) 97.6 F (36.4 C)  SpO2: 100% 100%   Vitals:   09/01/24 1631 09/01/24 1951 09/02/24 0451 09/02/24 0730  BP: (!) 149/76 (!) 138/58 (!) 140/72 (!) 142/75  Pulse: 72 70 69 79  Resp: 17 16  16   Temp: 98 F (36.7 C) (!) 97.2 F (36.2 C) (!) 97.5 F (36.4 C) 97.6 F (36.4 C)  TempSrc:  Oral Oral   SpO2: 100% 100% 100% 100%  Weight:      Height:          Discharge Instructions   Allergies as of 09/02/2024       Reactions   Bee Venom         Medication List     STOP taking these medications    atorvastatin  20 MG tablet Commonly known as: LIPITOR   ezetimibe  10 MG tablet Commonly known as: ZETIA    insulin  glargine 100 UNIT/ML injection Commonly known as: LANTUS    MiraLax  17 GM/SCOOP powder Generic drug: polyethylene glycol powder       TAKE these medications    acetaminophen  500 MG tablet Commonly known as: TYLENOL  Take 500 mg by mouth every 6 (six) hours as needed for mild pain (pain score 1-3), headache or fever.   amLODipine  2.5 MG tablet Commonly known as: NORVASC  Take 1 tablet by mouth.   aspirin  325 MG tablet Take 325 mg by mouth daily.   carvedilol  25 MG tablet Commonly known as: COREG  Take 1 tablet (25 mg total) by mouth 2 (two) times daily.   EPINEPHrine  0.3 mg/0.3 mL Soaj injection Commonly known as:  EPI-PEN Inject 0.3 mg into the muscle as needed for anaphylaxis.   ergocalciferol 1.25 MG (50000 UT) capsule Commonly known as: VITAMIN D2 Take 1 capsule by mouth once a week. Take every Tuesday   ferrous sulfate  325 (65 FE) MG tablet Take 325 mg by mouth in the morning and at bedtime.   hydrALAZINE  25 MG tablet Commonly known as: APRESOLINE  Take 25 mg by mouth 3 (three) times daily.   levETIRAcetam  250 MG tablet Commonly known as: Keppra  Take 1 tablet (250 mg total) by mouth 2 (two) times daily.   levETIRAcetam  250 MG tablet Commonly known as: KEPPRA  Take 1 tablet (250 mg total) by mouth every Monday, Wednesday, and Friday at 8 PM. Extra dose to be given after HD on HD days   levothyroxine  50 MCG tablet Commonly known as: SYNTHROID  Take 50 mcg by mouth daily before breakfast.   Multi-Vitamins Tabs Take 1 tablet by mouth daily. Unknown strehgth   nitroGLYCERIN  0.4 MG SL tablet Commonly known as: NITROSTAT  Place 0.4 mg under the tongue every 5 (five) minutes as needed for chest pain.   OLANZapine  2.5 MG tablet Commonly known as: ZYPREXA  Take 1  tablet (2.5 mg total) by mouth at bedtime as needed (sleep and agitation). What changed:  when to take this reasons to take this   omeprazole 40 MG capsule Commonly known as: PRILOSEC Take 40 mg by mouth daily.   Pro-Stat Liqd Take 30 mLs by mouth in the morning and at bedtime.   Rybelsus 7 MG Tabs Generic drug: Semaglutide Take 1 tablet by mouth every morning.   senna-docusate 8.6-50 MG tablet Commonly known as: Senokot-S Take 2 tablets by mouth daily.   Sennosides 8.6 MG Caps Take by mouth.   sodium bicarbonate  650 MG tablet Take 1 tablet by mouth 3 (three) times daily.               Durable Medical Equipment  (From admission, onward)           Start     Ordered   09/01/24 0840  For home use only DME Hospital bed  Once       Question Answer Comment  Length of Need Lifetime   Patient has (list  medical condition): Comfort care/hospice   The above medical condition requires: Patient requires the ability to reposition frequently   Bed type Semi-electric   Support Surface: Gel Overlay      09/01/24 0840   09/01/24 0840  For home use only DME wheelchair cushion (seat and back)  Once       Comments: Hospice care patient   09/01/24 0840   09/01/24 0840  For home use only DME oxygen  Once       Question Answer Comment  Length of Need Lifetime   Liters per Minute 3   Frequency Continuous (stationary and portable oxygen unit needed)   Oxygen conserving device Yes   Oxygen delivery system Gas      09/01/24 0840            Contact information for after-discharge care     Destination     [name] .   Service: Skilled Nursing Contact information: [address] [place]  [address] [phone]                    Allergies  Allergen Reactions   Bee Venom     You were cared for by a hospitalist during your hospital stay. If you have any questions about your discharge medications or the care you received while you were in the hospital after you are discharged, you can call the unit and asked to speak with the hospitalist on call if the hospitalist that took care of you is not available. Once you are discharged, your primary care physician will handle any further medical issues. Please note that no refills for any discharge medications will be authorized once you are discharged, as it is imperative that you return to your primary care physician (or establish a relationship with a primary care physician if you do not have one) for your aftercare needs so that they can reassess your need for medications and monitor your lab values.  You were cared for by a hospitalist during your hospital stay. If you have any questions about your discharge medications or the care you received while you were in the hospital after you  are discharged, you can call the unit and asked to speak with the hospitalist on call if the hospitalist that took care of you is not available. Once you are discharged, your primary care physician will handle any further  medical issues. Please note that NO REFILLS for any discharge medications will be authorized once you are discharged, as it is imperative that you return to your primary care physician (or establish a relationship with a primary care physician if you do not have one) for your aftercare needs so that they can reassess your need for medications and monitor your lab values.  Please request your Prim.MD to go over all Hospital Tests and Procedure/Radiological results at the follow up, please get all Hospital records sent to your Prim MD by signing hospital release before you go home.  Get CBC, CMP, 2 view Chest X ray checked  by Primary MD during your next visit or SNF MD in 5-7 days ( we routinely change or add medications that can affect your baseline labs and fluid status, therefore we recommend that you get the mentioned basic workup next visit with your PCP, your PCP may decide not to get them or add new tests based on their clinical decision)  On your next visit with your primary care physician please Get Medicines reviewed and adjusted.  If you experience worsening of your admission symptoms, develop shortness of breath, life threatening emergency, suicidal or homicidal thoughts you must seek medical attention immediately by calling 911 or calling your MD immediately  if symptoms less severe.  You Must read complete instructions/literature along with all the possible adverse reactions/side effects for all the Medicines you take and that have been prescribed to you. Take any new Medicines after you have completely understood and accpet all the possible adverse reactions/side effects.   Do not drive, operate heavy machinery, perform activities at heights, swimming or participation in  water activities or provide baby sitting services if your were admitted for syncope or siezures until you have seen by Primary MD or a Neurologist and advised to do so again.  Do not drive when taking Pain medications.   Procedures/Studies: IR Removal Tun Cv Cath W/O FL Result Date: 09/01/2024 INDICATION: 73 year old male on dialysis via a right tunneled IJ central catheter. Patient discharging to hospice care. Catheter no longer needed. EXAM: REMOVAL TUNNELED CENTRAL VENOUS CATHETER MEDICATIONS: None ANESTHESIA/SEDATION: None FLUOROSCOPY: None COMPLICATIONS: None immediate. PROCEDURE: Informed written consent was obtained from the patient after a thorough discussion of the procedural risks, benefits and alternatives. All questions were addressed. Maximal Sterile Barrier Technique was utilized including caps, mask, sterile gowns, sterile gloves, sterile drape, hand hygiene and skin antiseptic. A timeout was performed prior to the initiation of the procedure. The patient's right chest and catheter was prepped and draped in a normal sterile fashion. Heparin  was removed from both ports of catheter. 1% lidocaine  was used for local anesthesia. Using gentle blunt dissection the cuff of the catheter was exposed and the catheter was removed in its entirety. Pressure was held till hemostasis was obtained. A sterile dressing was applied. The patient tolerated the procedure well with no immediate complications. IMPRESSION: Successful catheter removal as described above. Performed by: Kacie Matthews PA-C Electronically Signed   By: Cordella Banner   On: 09/01/2024 16:35   EEG adult Result Date: 08/29/2024 Shelton Arlin KIDD, MD     08/29/2024  8:56 AM [name] MRN: [id number] Epilepsy Attending: Arlin KIDD Shelton Referring Physician/Provider: Khaliqdina, Salman, MD Date: 08/28/2024 Duration: 22.02 mins Patient history: 73yo M with an episode of unresponsiveness 15 minutes after a fall. EEG to  evaluate for seizure  Level of alertness: Awake AEDs during EEG study: LEV  Technical aspects:  This EEG study was done with scalp electrodes positioned according to the 10-20 International system of electrode placement. Electrical activity was reviewed with band pass filter of 1-70Hz , sensitivity of 7 uV/mm, display speed of 6mm/sec with a 60Hz  notched filter applied as appropriate. EEG data were recorded continuously and digitally stored.  Video monitoring was available and reviewed as appropriate.  Description: The posterior dominant rhythm consists of 8 Hz activity of moderate voltage (25-35 uV) seen predominantly in posterior head regions, symmetric and reactive to eye opening and eye closing. EEG showed continuous 3-5hz  theta- delta slowing in left centro-parietal region which at times. Intermittent generalized 3 to 6 Hz theta- delta slowing was also noted hyperventilation and photic stimulation were not performed.    ABNORMALITY - Continuous slow, left centro- parietal region - Intermittent slow, generalized  IMPRESSION: This study is suggestive of cortical dysfunction arising from left centro- parietal region likely secondary to underlying structural abnormality.  Additionally there is mild diffuse encephalopathy. No seizures or definite epileptiform discharges were seen throughout the recording.  Priyanka MALVA Krebs   CT HEAD WO CONTRAST ( ) Result Date: 08/28/2024 CLINICAL DATA:  73 year old male with altered mental status comment cephalopathy. Recent shortness of breath, found down after a fall. EXAM: CT HEAD WITHOUT CONTRAST TECHNIQUE: Contiguous axial images were obtained from the base of the skull through the vertex without intravenous contrast. RADIATION DOSE REDUCTION: This exam was performed according to the departmental dose-optimization program which includes automated exposure control, adjustment of the mA and/or kV according to patient size and/or use of iterative reconstruction technique.  COMPARISON:  Brain MRI 08/23/2024.  Head CT 08/22/2024. FINDINGS: Brain: Posterior left hemisphere chronic encephalomalacia. Ex vacuo ventricular enlargement. Patchy and confluent bilateral cerebral white matter hypodensity. Small chronic right cerebellar infarct. Stable gray-white matter differentiation throughout the brain. No midline shift, ventriculomegaly, mass effect, evidence of mass lesion, intracranial hemorrhage or evidence of cortically based acute infarction. Vascular: Calcified atherosclerosis at the skull base. No suspicious intracranial vascular hyperdensity. Skull: Appears stable and intact. Sinuses/Orbits: Visualized paranasal sinuses and mastoids are stable and well aerated. Other: No acute orbit or scalp soft tissue finding. IMPRESSION: 1. No acute intracranial abnormality. 2. Continued Stable non contrast CT appearance of chronic ischemic disease. Electronically Signed   By: VEAR Hurst M.D.   On: 08/28/2024 11:39   DG CHEST PORT 1 VIEW Result Date: 08/24/2024 CLINICAL DATA:  Short of breath EXAM: PORTABLE CHEST 1 VIEW COMPARISON:  08/04/2024 FINDINGS: Single frontal view of the chest demonstrates stable right internal jugular dialysis catheter. Cardiac silhouette remains enlarged. There is progressive vascular congestion and bilateral airspace disease consistent with worsening edema. Small right pleural effusion. No pneumothorax. IMPRESSION: 1. Congestive heart failure, with worsening volume status and progressive pulmonary edema. Electronically Signed   By: Ozell Daring M.D.   On: 08/24/2024 22:15   Overnight EEG with video Result Date: 08/24/2024 Krebs Arlin MALVA, MD     08/25/2024  9:15 AM [name] MRN: [id number] Epilepsy Attending: Arlin MALVA Krebs Referring Physician/Provider:de Clint Abbey Earle FORBES, NP Duration: 08/23/2024 1152 to 08/24/2024 1800  Patient history: 73yo M with an episode of unresponsiveness 15 minutes after a fall. EEG to evaluate for seizure  Level  of alertness: Awake, asleep  AEDs during EEG study: None  Technical aspects: This EEG study was done with scalp electrodes positioned according to the 10-20 International system of electrode placement. Electrical activity was reviewed with band pass filter of 1-70Hz , sensitivity of 7 uV/mm,  display speed of 51mm/sec with a 60Hz  notched filter applied as appropriate. EEG data were recorded continuously and digitally stored.  Video monitoring was available and reviewed as appropriate.  Description: The posterior dominant rhythm consists of 8 Hz activity of moderate voltage (25-35 uV) seen predominantly in posterior head regions, symmetric and reactive to eye opening and eye closing. Sleep was characterized by vertex waves, sleep spindles (12 to 14 Hz), maximal frontocentral region. EEG showed continuous 3-5hz  theta- delta slowing in left centro-parietal region which at times appeared high amplitude and sharply contoured lasting for about 1-2 minutes without definite evolution.  Intermittent generalized 3 to 6 Hz theta- delta slowing was also noted hyperventilation and photic stimulation were not performed.   EEG was not interpretable between 08/23/2024 2254 to 08/24/2024 0503 due to electrode artifact  ABNORMALITY - Continuous slow, left centro- parietal region - Intermittent slow, generalized  IMPRESSION: This study is suggestive of cortical dysfunction arising from left centro- parietal region likely secondary to underlying structural abnormality.  Additionally there is mild diffuse encephalopathy. No seizures or definite epileptiform discharges were seen throughout the recording.  Arlin MALVA Krebs    MR BRAIN WO CONTRAST Result Date: 08/23/2024 CLINICAL DATA:  73 year old male code stroke presentation yesterday. EXAM: MRI HEAD WITHOUT CONTRAST TECHNIQUE: Multiplanar, multiecho pulse sequences of the brain and surrounding structures were obtained without intravenous contrast. COMPARISON:  Head CT yesterday. Brain MRI  08/09/2024 [facility] FINDINGS: Brain: No restricted diffusion or evidence of acute infarction. Chronic encephalomalacia in the posterior left hemisphere affecting posterior left MCA and left MCA/PCA watershed territory. Ex vacuo ventricular enlargement. Fairly widely scattered chronic microhemorrhages in addition to hemosiderin within the area of cortical encephalomalacia. And occasional other small areas of superficial siderosis (anterior frontal lobes series 7, image 65). Patchy and confluent bilateral cerebral white matter, pontine T2 and FLAIR hyperintensity. Occasional small chronic cerebellar infarcts. Deep gray nuclei relatively spared. No midline shift, mass effect, evidence of mass lesion, ventriculomegaly, extra-axial collection or acute intracranial hemorrhage. Cervicomedullary junction and pituitary are within normal limits. Vascular: Major intracranial vascular flow voids are stable from last month. Skull and upper cervical spine: Negative. Sinuses/Orbits: Stable and negative orbits. Mild paranasal sinus mucosal thickening and small retention cysts are stable. Other: Mastoids remain well aerated. Grossly negative visible internal auditory structures. Negative visible scalp and face. IMPRESSION: 1. No acute intracranial abnormality. 2. Stable since last month chronic ischemic disease, most pronounced in the posterior left MCA territory. Hemosiderin there, but also a number of additional scattered chronic microhemorrhages and mild frontal lobe superficial siderosis. Amyloid Angiopathy is not excluded. Electronically Signed   By: VEAR Hurst M.D.   On: 08/23/2024 10:20   EEG adult Result Date: 08/23/2024 Krebs Arlin MALVA, MD     08/23/2024  9:09 AM [name] MRN: [id number] Epilepsy Attending: Arlin MALVA Krebs Referring Physician/Provider: Merrianne Locus, MD Date: 08/23/2024 Duration: 23.22 mins Patient history: 73yo M with an episode of  unresponsiveness 15 minutes after a fall. EEG to evaluate for seizure Level of alertness: Awake, asleep AEDs during EEG study: None Technical aspects: This EEG study was done with scalp electrodes positioned according to the 10-20 International system of electrode placement. Electrical activity was reviewed with band pass filter of 1-70Hz , sensitivity of 7 uV/mm, display speed of 76mm/sec with a 60Hz  notched filter applied as appropriate. EEG data were recorded continuously and digitally stored.  Video monitoring was available and reviewed as appropriate. Description:  The posterior dominant rhythm consists of 8 Hz activity of moderate voltage (25-35 uV) seen predominantly in posterior head regions, symmetric and reactive to eye opening and eye closing. Sleep was characterized by vertex waves, sleep spindles (12 to 14 Hz), maximal frontocentral region.  EEG showed continuous 3-5hz  theta- delta slowing in left centro-parietal region.  Additionally at around 0451 on 08/23/2024, EEG showed sharply contoured high amplitude 3-5 hz theta-delta slowing in left central parietal region which appeared high amplitude and sharply contoured lasting for about 2 minutes without definite evolution. Hyperventilation and photic stimulation were not performed.   ABNORMALITY - Continuous slow, left centro- parietal region IMPRESSION: This study is suggestive of cortical dysfunction arising from left centro- parietal region likely secondary to underlying structural abnormality. No seizures or definite epileptiform discharges were seen throughout the recording. Arlin MALVA Krebs   CT HEAD CODE STROKE WO CONTRAST Result Date: 08/22/2024 CLINICAL DATA:  Code stroke. Initial evaluation for acute neuro deficit, stroke suspected. EXAM: CT HEAD WITHOUT CONTRAST TECHNIQUE: Contiguous axial images were obtained from the base of the skull through the vertex without intravenous contrast. RADIATION DOSE REDUCTION: This exam was performed according to  the departmental dose-optimization program which includes automated exposure control, adjustment of the mA and/or kV according to patient size and/or use of iterative reconstruction technique. COMPARISON:  Prior study from 12/25/2018 FINDINGS: Brain: Generalized age-related cerebral atrophy with chronic microvascular ischemic disease. Large remote left MCA distribution infarct involving the left cerebral hemisphere again seen, stable. Remote lacunar infarct at the left frontal corona radiata. No acute intracranial hemorrhage. No acute large vessel territory infarct. No mass lesion or midline shift. Ex vacuo dilatation of the left lower ventricle without hydrocephalus. No extra-axial fluid collection. Vascular: No abnormal hyperdense vessel. Calcified atherosclerosis present at the skull base. Skull: Scalp soft tissues demonstrate no acute finding. Calvarium intact. Sinuses/Orbits: Globes and orbital soft tissues within normal limits. Small left maxillary sinus retention cyst. Paranasal sinuses mastoid air cells are otherwise clear. Other: None. ASPECTS [facility] Stroke Program Early CT Score) - Ganglionic level infarction (caudate, lentiform nuclei, internal capsule, insula, M1-M3 cortex): 7 - Supraganglionic infarction (M4-M6 cortex): 3 Total score (0-10 with 10 being normal): 10 IMPRESSION: 1. No acute intracranial abnormality. 2. Aspects is 10. 3. 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 These results were communicated to Dr. Lindzen at 11:26 p.m. on 08/22/2024 by text page via the [facility] messaging system. Electronically Signed   By: Morene Hoard M.D.   On: 08/22/2024 23:29     The results of significant diagnostics from this hospitalization (including imaging, microbiology, ancillary and laboratory) are listed below for reference.     Microbiology: No results found for this or any previous visit (from the past 240 hours).    Labs: BNP (last 3 results) No results for input(s): BNP in the last 8760 hours. Basic Metabolic Panel: Recent Labs  Lab 08/27/24 0533 08/28/24 0336 08/29/24 0457 08/30/24 0425 08/31/24 0424 09/01/24 0340 09/02/24 0327  NA 136   < > 138 138 135 135 137  K 3.3*   < > 3.8 3.9 4.4 4.5 4.3  CL 95*   < > 98 97* 93* 95* 97*  CO2 26   < > 26 27 30 28 29   GLUCOSE 123*   < > 89 97 138* 120* 115*  BUN 43*   < > 39* 30* 42* 36* 47*  CREATININE 3.29*   < > 3.19* 2.70*  3.42* 2.80* 3.40*  CALCIUM  8.3*   < > 8.2* 8.2* 8.3* 8.0* 8.2*  MG 2.0   < > 2.0 2.0 2.1 1.9 2.0  PHOS 4.0  --   --   --   --   --   --    < > = values in this interval not displayed.   Liver Function Tests: No results for input(s): AST, ALT, ALKPHOS, BILITOT, PROT, ALBUMIN in the last 168 hours. No results for input(s): LIPASE, AMYLASE in the last 168 hours. No results for input(s): AMMONIA in the last 168 hours. CBC: Recent Labs  Lab 08/28/24 0336 08/29/24 0457 08/30/24 0425 08/31/24 0424 08/31/24 0733  WBC 7.4 7.1 6.2 6.7 6.8  HGB 8.9* 8.8* 8.8* 8.6* 8.6*  HCT 27.4* 27.5* 28.2* 27.2* 27.8*  MCV 89.8 91.7 92.8 93.8 93.6  PLT 165 148* 147* 161 162   Cardiac Enzymes: No results for input(s): CKTOTAL, CKMB, CKMBINDEX, TROPONINI in the last 168 hours. BNP: Invalid input(s): POCBNP CBG: Recent Labs  Lab 09/01/24 0838 09/01/24 1250 09/01/24 1558 09/01/24 2138 09/02/24 0725  GLUCAP 184* 102* 109* 114* 113*   D-Dimer No results for input(s): DDIMER in the last 72 hours. Hgb A1c No results for input(s): HGBA1C in the last 72 hours. Lipid Profile No results for input(s): CHOL, HDL, LDLCALC, TRIG, CHOLHDL, LDLDIRECT in the last 72 hours. Thyroid  function studies No results for input(s): TSH, T4TOTAL, T3FREE, THYROIDAB in the last 72 hours.  Invalid input(s): FREET3 Anemia work up No results for input(s): VITAMINB12, FOLATE, FERRITIN, TIBC,  IRON, RETICCTPCT in the last 72 hours. Urinalysis    Component Value Date/Time   COLORURINE AMBER (A) 08/24/2024 1658   APPEARANCEUR CLEAR 08/24/2024 1658   LABSPEC 1.024 08/24/2024 1658   PHURINE 5.0 08/24/2024 1658   GLUCOSEU 150 (A) 08/24/2024 1658   HGBUR NEGATIVE 08/24/2024 1658   BILIRUBINUR NEGATIVE 08/24/2024 1658   BILIRUBINUR small 01/29/2012 0909   KETONESUR NEGATIVE 08/24/2024 1658   PROTEINUR >=300 (A) 08/24/2024 1658   UROBILINOGEN 0.2 01/29/2012 0909   NITRITE NEGATIVE 08/24/2024 1658   LEUKOCYTESUR NEGATIVE 08/24/2024 1658   Sepsis Labs Recent Labs  Lab 08/29/24 0457 08/30/24 0425 08/31/24 0424 08/31/24 0733  WBC 7.1 6.2 6.7 6.8   Microbiology No results found for this or any previous visit (from the past 240 hours).   Time coordinating discharge:  I have spent 35 minutes face to face with the patient and on the ward discussing the patients care, assessment, plan and disposition with other care givers. >50% of the time was devoted counseling the patient about the risks and benefits of treatment/Discharge disposition and coordinating care.   SIGNED:   Burgess JAYSON Dare, MD  [facility] 09/02/2024, 8:30 AM   If 7PM-7AM, please contact night-coverage

## 2024-09-19 ENCOUNTER — Other Ambulatory Visit: Payer: Self-pay | Admitting: Cardiology

## 2024-10-15 DEATH — deceased
